# Patient Record
Sex: Female | Born: 1945 | Race: White | Hispanic: No | Smoking: Former smoker
Health system: Southern US, Community
[De-identification: ages and names within clinical notes are randomized; demographics above are authoritative.]

## PROBLEM LIST (undated history)

## (undated) DIAGNOSIS — I739 Peripheral vascular disease, unspecified: Secondary | ICD-10-CM

## (undated) DIAGNOSIS — E079 Disorder of thyroid, unspecified: Secondary | ICD-10-CM

## (undated) HISTORY — DX: Disorder of thyroid, unspecified: E07.9

## (undated) HISTORY — DX: Peripheral vascular disease, unspecified: I73.9

---

## 2014-11-14 DIAGNOSIS — Z9181 History of falling: Secondary | ICD-10-CM | POA: Diagnosis not present

## 2014-11-14 DIAGNOSIS — Z79899 Other long term (current) drug therapy: Secondary | ICD-10-CM | POA: Diagnosis not present

## 2014-11-14 DIAGNOSIS — E780 Pure hypercholesterolemia: Secondary | ICD-10-CM | POA: Diagnosis not present

## 2014-11-14 DIAGNOSIS — Z0000 Encounter for general adult medical examination without abnormal findings: Secondary | ICD-10-CM | POA: Diagnosis not present

## 2014-11-14 DIAGNOSIS — E039 Hypothyroidism, unspecified: Secondary | ICD-10-CM | POA: Diagnosis not present

## 2015-05-09 DIAGNOSIS — D72829 Elevated white blood cell count, unspecified: Secondary | ICD-10-CM | POA: Diagnosis not present

## 2015-05-09 DIAGNOSIS — B961 Klebsiella pneumoniae [K. pneumoniae] as the cause of diseases classified elsewhere: Secondary | ICD-10-CM | POA: Diagnosis not present

## 2015-05-09 DIAGNOSIS — E46 Unspecified protein-calorie malnutrition: Secondary | ICD-10-CM | POA: Diagnosis not present

## 2015-05-09 DIAGNOSIS — D539 Nutritional anemia, unspecified: Secondary | ICD-10-CM | POA: Diagnosis not present

## 2015-05-09 DIAGNOSIS — Z7401 Bed confinement status: Secondary | ICD-10-CM | POA: Diagnosis not present

## 2015-05-09 DIAGNOSIS — J810 Acute pulmonary edema: Secondary | ICD-10-CM | POA: Diagnosis not present

## 2015-05-09 DIAGNOSIS — N390 Urinary tract infection, site not specified: Secondary | ICD-10-CM | POA: Diagnosis not present

## 2015-05-09 DIAGNOSIS — J159 Unspecified bacterial pneumonia: Secondary | ICD-10-CM | POA: Diagnosis not present

## 2015-05-09 DIAGNOSIS — Z720 Tobacco use: Secondary | ICD-10-CM | POA: Diagnosis not present

## 2015-05-09 DIAGNOSIS — J9620 Acute and chronic respiratory failure, unspecified whether with hypoxia or hypercapnia: Secondary | ICD-10-CM | POA: Diagnosis not present

## 2015-05-09 DIAGNOSIS — F419 Anxiety disorder, unspecified: Secondary | ICD-10-CM | POA: Diagnosis not present

## 2015-05-09 DIAGNOSIS — J690 Pneumonitis due to inhalation of food and vomit: Secondary | ICD-10-CM | POA: Diagnosis not present

## 2015-05-09 DIAGNOSIS — E876 Hypokalemia: Secondary | ICD-10-CM | POA: Diagnosis not present

## 2015-05-09 DIAGNOSIS — D649 Anemia, unspecified: Secondary | ICD-10-CM | POA: Diagnosis not present

## 2015-05-09 DIAGNOSIS — J90 Pleural effusion, not elsewhere classified: Secondary | ICD-10-CM | POA: Diagnosis not present

## 2015-05-09 DIAGNOSIS — D644 Congenital dyserythropoietic anemia: Secondary | ICD-10-CM | POA: Diagnosis not present

## 2015-05-09 DIAGNOSIS — Z452 Encounter for adjustment and management of vascular access device: Secondary | ICD-10-CM | POA: Diagnosis not present

## 2015-05-09 DIAGNOSIS — J9811 Atelectasis: Secondary | ICD-10-CM | POA: Diagnosis not present

## 2015-05-09 DIAGNOSIS — M549 Dorsalgia, unspecified: Secondary | ICD-10-CM | POA: Diagnosis not present

## 2015-05-09 DIAGNOSIS — I878 Other specified disorders of veins: Secondary | ICD-10-CM | POA: Diagnosis not present

## 2015-05-09 DIAGNOSIS — J189 Pneumonia, unspecified organism: Secondary | ICD-10-CM | POA: Diagnosis not present

## 2015-05-09 DIAGNOSIS — R5381 Other malaise: Secondary | ICD-10-CM | POA: Diagnosis not present

## 2015-05-09 DIAGNOSIS — J9601 Acute respiratory failure with hypoxia: Secondary | ICD-10-CM | POA: Diagnosis not present

## 2015-05-09 DIAGNOSIS — E039 Hypothyroidism, unspecified: Secondary | ICD-10-CM | POA: Diagnosis not present

## 2015-05-09 DIAGNOSIS — L89151 Pressure ulcer of sacral region, stage 1: Secondary | ICD-10-CM | POA: Diagnosis not present

## 2015-05-09 DIAGNOSIS — I10 Essential (primary) hypertension: Secondary | ICD-10-CM | POA: Diagnosis not present

## 2015-05-09 DIAGNOSIS — E785 Hyperlipidemia, unspecified: Secondary | ICD-10-CM | POA: Diagnosis not present

## 2015-05-09 DIAGNOSIS — R0902 Hypoxemia: Secondary | ICD-10-CM | POA: Diagnosis not present

## 2015-05-09 DIAGNOSIS — B9620 Unspecified Escherichia coli [E. coli] as the cause of diseases classified elsewhere: Secondary | ICD-10-CM | POA: Diagnosis not present

## 2015-05-09 DIAGNOSIS — R2689 Other abnormalities of gait and mobility: Secondary | ICD-10-CM | POA: Diagnosis not present

## 2015-05-09 DIAGNOSIS — J40 Bronchitis, not specified as acute or chronic: Secondary | ICD-10-CM | POA: Diagnosis not present

## 2015-05-09 DIAGNOSIS — Z9981 Dependence on supplemental oxygen: Secondary | ICD-10-CM | POA: Diagnosis not present

## 2015-05-09 DIAGNOSIS — F1020 Alcohol dependence, uncomplicated: Secondary | ICD-10-CM | POA: Diagnosis not present

## 2015-05-09 DIAGNOSIS — Z602 Problems related to living alone: Secondary | ICD-10-CM | POA: Diagnosis not present

## 2015-05-09 DIAGNOSIS — E7800 Pure hypercholesterolemia, unspecified: Secondary | ICD-10-CM | POA: Diagnosis not present

## 2015-05-09 DIAGNOSIS — R1310 Dysphagia, unspecified: Secondary | ICD-10-CM | POA: Diagnosis not present

## 2015-05-09 DIAGNOSIS — R4182 Altered mental status, unspecified: Secondary | ICD-10-CM | POA: Diagnosis not present

## 2015-05-09 DIAGNOSIS — Z681 Body mass index (BMI) 19 or less, adult: Secondary | ICD-10-CM | POA: Diagnosis not present

## 2015-05-09 DIAGNOSIS — J9690 Respiratory failure, unspecified, unspecified whether with hypoxia or hypercapnia: Secondary | ICD-10-CM | POA: Diagnosis not present

## 2015-05-09 DIAGNOSIS — E8770 Fluid overload, unspecified: Secondary | ICD-10-CM | POA: Diagnosis not present

## 2015-05-09 DIAGNOSIS — R17 Unspecified jaundice: Secondary | ICD-10-CM | POA: Diagnosis not present

## 2015-05-09 DIAGNOSIS — R918 Other nonspecific abnormal finding of lung field: Secondary | ICD-10-CM | POA: Diagnosis not present

## 2015-05-09 DIAGNOSIS — N3000 Acute cystitis without hematuria: Secondary | ICD-10-CM | POA: Diagnosis not present

## 2015-05-09 DIAGNOSIS — R29898 Other symptoms and signs involving the musculoskeletal system: Secondary | ICD-10-CM | POA: Diagnosis not present

## 2015-05-09 DIAGNOSIS — M6281 Muscle weakness (generalized): Secondary | ICD-10-CM | POA: Diagnosis not present

## 2015-05-09 DIAGNOSIS — R197 Diarrhea, unspecified: Secondary | ICD-10-CM | POA: Diagnosis not present

## 2015-05-09 DIAGNOSIS — I959 Hypotension, unspecified: Secondary | ICD-10-CM | POA: Diagnosis not present

## 2015-05-09 DIAGNOSIS — R531 Weakness: Secondary | ICD-10-CM | POA: Diagnosis not present

## 2015-05-09 DIAGNOSIS — Z9989 Dependence on other enabling machines and devices: Secondary | ICD-10-CM | POA: Diagnosis not present

## 2015-05-09 DIAGNOSIS — F1010 Alcohol abuse, uncomplicated: Secondary | ICD-10-CM | POA: Diagnosis not present

## 2015-05-27 DIAGNOSIS — N390 Urinary tract infection, site not specified: Secondary | ICD-10-CM | POA: Diagnosis not present

## 2015-05-27 DIAGNOSIS — E785 Hyperlipidemia, unspecified: Secondary | ICD-10-CM | POA: Diagnosis not present

## 2015-05-27 DIAGNOSIS — R2689 Other abnormalities of gait and mobility: Secondary | ICD-10-CM | POA: Diagnosis not present

## 2015-05-27 DIAGNOSIS — R29898 Other symptoms and signs involving the musculoskeletal system: Secondary | ICD-10-CM | POA: Diagnosis not present

## 2015-05-27 DIAGNOSIS — D72829 Elevated white blood cell count, unspecified: Secondary | ICD-10-CM | POA: Diagnosis not present

## 2015-05-27 DIAGNOSIS — J690 Pneumonitis due to inhalation of food and vomit: Secondary | ICD-10-CM | POA: Diagnosis not present

## 2015-05-27 DIAGNOSIS — F419 Anxiety disorder, unspecified: Secondary | ICD-10-CM | POA: Diagnosis not present

## 2015-05-27 DIAGNOSIS — R531 Weakness: Secondary | ICD-10-CM | POA: Diagnosis not present

## 2015-05-27 DIAGNOSIS — J9611 Chronic respiratory failure with hypoxia: Secondary | ICD-10-CM | POA: Diagnosis not present

## 2015-05-27 DIAGNOSIS — D649 Anemia, unspecified: Secondary | ICD-10-CM | POA: Diagnosis not present

## 2015-05-27 DIAGNOSIS — L89151 Pressure ulcer of sacral region, stage 1: Secondary | ICD-10-CM | POA: Diagnosis not present

## 2015-05-27 DIAGNOSIS — Z7401 Bed confinement status: Secondary | ICD-10-CM | POA: Diagnosis not present

## 2015-05-27 DIAGNOSIS — F1010 Alcohol abuse, uncomplicated: Secondary | ICD-10-CM | POA: Diagnosis not present

## 2015-05-27 DIAGNOSIS — R627 Adult failure to thrive: Secondary | ICD-10-CM | POA: Diagnosis not present

## 2015-05-27 DIAGNOSIS — E039 Hypothyroidism, unspecified: Secondary | ICD-10-CM | POA: Diagnosis not present

## 2015-05-27 DIAGNOSIS — R262 Difficulty in walking, not elsewhere classified: Secondary | ICD-10-CM | POA: Diagnosis not present

## 2015-05-27 DIAGNOSIS — J9601 Acute respiratory failure with hypoxia: Secondary | ICD-10-CM | POA: Diagnosis not present

## 2015-05-27 DIAGNOSIS — I959 Hypotension, unspecified: Secondary | ICD-10-CM | POA: Diagnosis not present

## 2015-05-27 DIAGNOSIS — E876 Hypokalemia: Secondary | ICD-10-CM | POA: Diagnosis not present

## 2015-05-27 DIAGNOSIS — I878 Other specified disorders of veins: Secondary | ICD-10-CM | POA: Diagnosis not present

## 2015-05-27 DIAGNOSIS — R1310 Dysphagia, unspecified: Secondary | ICD-10-CM | POA: Diagnosis not present

## 2015-05-27 DIAGNOSIS — M6281 Muscle weakness (generalized): Secondary | ICD-10-CM | POA: Diagnosis not present

## 2015-05-27 DIAGNOSIS — J159 Unspecified bacterial pneumonia: Secondary | ICD-10-CM | POA: Diagnosis not present

## 2015-05-27 DIAGNOSIS — I10 Essential (primary) hypertension: Secondary | ICD-10-CM | POA: Diagnosis not present

## 2015-05-27 DIAGNOSIS — D539 Nutritional anemia, unspecified: Secondary | ICD-10-CM | POA: Diagnosis not present

## 2015-05-27 DIAGNOSIS — J449 Chronic obstructive pulmonary disease, unspecified: Secondary | ICD-10-CM | POA: Diagnosis not present

## 2015-06-04 DIAGNOSIS — J9611 Chronic respiratory failure with hypoxia: Secondary | ICD-10-CM | POA: Diagnosis not present

## 2015-06-04 DIAGNOSIS — R627 Adult failure to thrive: Secondary | ICD-10-CM | POA: Diagnosis not present

## 2015-06-04 DIAGNOSIS — R262 Difficulty in walking, not elsewhere classified: Secondary | ICD-10-CM | POA: Diagnosis not present

## 2015-06-04 DIAGNOSIS — D649 Anemia, unspecified: Secondary | ICD-10-CM | POA: Diagnosis not present

## 2015-06-15 DIAGNOSIS — J9601 Acute respiratory failure with hypoxia: Secondary | ICD-10-CM | POA: Diagnosis not present

## 2015-06-15 DIAGNOSIS — Z87891 Personal history of nicotine dependence: Secondary | ICD-10-CM | POA: Diagnosis not present

## 2015-06-15 DIAGNOSIS — I10 Essential (primary) hypertension: Secondary | ICD-10-CM | POA: Diagnosis not present

## 2015-06-15 DIAGNOSIS — F1010 Alcohol abuse, uncomplicated: Secondary | ICD-10-CM | POA: Diagnosis not present

## 2015-06-15 DIAGNOSIS — Z9181 History of falling: Secondary | ICD-10-CM | POA: Diagnosis not present

## 2015-06-15 DIAGNOSIS — Z7982 Long term (current) use of aspirin: Secondary | ICD-10-CM | POA: Diagnosis not present

## 2015-06-15 DIAGNOSIS — D649 Anemia, unspecified: Secondary | ICD-10-CM | POA: Diagnosis not present

## 2015-06-16 DIAGNOSIS — I10 Essential (primary) hypertension: Secondary | ICD-10-CM | POA: Diagnosis not present

## 2015-06-16 DIAGNOSIS — J9601 Acute respiratory failure with hypoxia: Secondary | ICD-10-CM | POA: Diagnosis not present

## 2015-06-16 DIAGNOSIS — Z87891 Personal history of nicotine dependence: Secondary | ICD-10-CM | POA: Diagnosis not present

## 2015-06-16 DIAGNOSIS — Z9181 History of falling: Secondary | ICD-10-CM | POA: Diagnosis not present

## 2015-06-16 DIAGNOSIS — F1010 Alcohol abuse, uncomplicated: Secondary | ICD-10-CM | POA: Diagnosis not present

## 2015-06-16 DIAGNOSIS — Z7982 Long term (current) use of aspirin: Secondary | ICD-10-CM | POA: Diagnosis not present

## 2015-06-16 DIAGNOSIS — D649 Anemia, unspecified: Secondary | ICD-10-CM | POA: Diagnosis not present

## 2015-06-17 DIAGNOSIS — I10 Essential (primary) hypertension: Secondary | ICD-10-CM | POA: Diagnosis not present

## 2015-06-17 DIAGNOSIS — D649 Anemia, unspecified: Secondary | ICD-10-CM | POA: Diagnosis not present

## 2015-06-17 DIAGNOSIS — Z7982 Long term (current) use of aspirin: Secondary | ICD-10-CM | POA: Diagnosis not present

## 2015-06-17 DIAGNOSIS — Z9181 History of falling: Secondary | ICD-10-CM | POA: Diagnosis not present

## 2015-06-17 DIAGNOSIS — J9601 Acute respiratory failure with hypoxia: Secondary | ICD-10-CM | POA: Diagnosis not present

## 2015-06-17 DIAGNOSIS — Z87891 Personal history of nicotine dependence: Secondary | ICD-10-CM | POA: Diagnosis not present

## 2015-06-17 DIAGNOSIS — F1010 Alcohol abuse, uncomplicated: Secondary | ICD-10-CM | POA: Diagnosis not present

## 2015-11-17 DIAGNOSIS — E039 Hypothyroidism, unspecified: Secondary | ICD-10-CM | POA: Diagnosis not present

## 2015-11-17 DIAGNOSIS — E785 Hyperlipidemia, unspecified: Secondary | ICD-10-CM | POA: Diagnosis not present

## 2015-11-17 DIAGNOSIS — I10 Essential (primary) hypertension: Secondary | ICD-10-CM | POA: Diagnosis not present

## 2015-11-17 DIAGNOSIS — F17200 Nicotine dependence, unspecified, uncomplicated: Secondary | ICD-10-CM | POA: Diagnosis not present

## 2015-11-17 DIAGNOSIS — R739 Hyperglycemia, unspecified: Secondary | ICD-10-CM | POA: Diagnosis not present

## 2015-11-17 DIAGNOSIS — Z79899 Other long term (current) drug therapy: Secondary | ICD-10-CM | POA: Diagnosis not present

## 2015-11-17 DIAGNOSIS — D509 Iron deficiency anemia, unspecified: Secondary | ICD-10-CM | POA: Diagnosis not present

## 2015-11-17 DIAGNOSIS — Z716 Tobacco abuse counseling: Secondary | ICD-10-CM | POA: Diagnosis not present

## 2016-05-14 DIAGNOSIS — Z79899 Other long term (current) drug therapy: Secondary | ICD-10-CM | POA: Diagnosis not present

## 2016-05-14 DIAGNOSIS — E039 Hypothyroidism, unspecified: Secondary | ICD-10-CM | POA: Diagnosis not present

## 2016-05-14 DIAGNOSIS — E785 Hyperlipidemia, unspecified: Secondary | ICD-10-CM | POA: Diagnosis not present

## 2016-05-14 DIAGNOSIS — Z720 Tobacco use: Secondary | ICD-10-CM | POA: Diagnosis not present

## 2016-05-14 DIAGNOSIS — D509 Iron deficiency anemia, unspecified: Secondary | ICD-10-CM | POA: Diagnosis not present

## 2016-05-14 DIAGNOSIS — I10 Essential (primary) hypertension: Secondary | ICD-10-CM | POA: Diagnosis not present

## 2017-05-25 DIAGNOSIS — I10 Essential (primary) hypertension: Secondary | ICD-10-CM | POA: Diagnosis not present

## 2017-05-25 DIAGNOSIS — Z720 Tobacco use: Secondary | ICD-10-CM | POA: Diagnosis not present

## 2017-05-25 DIAGNOSIS — E785 Hyperlipidemia, unspecified: Secondary | ICD-10-CM | POA: Diagnosis not present

## 2017-05-25 DIAGNOSIS — D509 Iron deficiency anemia, unspecified: Secondary | ICD-10-CM | POA: Diagnosis not present

## 2017-05-25 DIAGNOSIS — D519 Vitamin B12 deficiency anemia, unspecified: Secondary | ICD-10-CM | POA: Diagnosis not present

## 2017-05-25 DIAGNOSIS — Z0000 Encounter for general adult medical examination without abnormal findings: Secondary | ICD-10-CM | POA: Diagnosis not present

## 2017-05-25 DIAGNOSIS — Z79899 Other long term (current) drug therapy: Secondary | ICD-10-CM | POA: Diagnosis not present

## 2017-05-25 DIAGNOSIS — E039 Hypothyroidism, unspecified: Secondary | ICD-10-CM | POA: Diagnosis not present

## 2018-09-14 DIAGNOSIS — Z0000 Encounter for general adult medical examination without abnormal findings: Secondary | ICD-10-CM | POA: Diagnosis not present

## 2018-09-14 DIAGNOSIS — E785 Hyperlipidemia, unspecified: Secondary | ICD-10-CM | POA: Diagnosis not present

## 2018-09-14 DIAGNOSIS — E039 Hypothyroidism, unspecified: Secondary | ICD-10-CM | POA: Diagnosis not present

## 2018-09-14 DIAGNOSIS — D510 Vitamin B12 deficiency anemia due to intrinsic factor deficiency: Secondary | ICD-10-CM | POA: Diagnosis not present

## 2018-09-14 DIAGNOSIS — D509 Iron deficiency anemia, unspecified: Secondary | ICD-10-CM | POA: Diagnosis not present

## 2018-09-14 DIAGNOSIS — Z79899 Other long term (current) drug therapy: Secondary | ICD-10-CM | POA: Diagnosis not present

## 2018-09-14 DIAGNOSIS — I10 Essential (primary) hypertension: Secondary | ICD-10-CM | POA: Diagnosis not present

## 2019-10-02 DIAGNOSIS — E039 Hypothyroidism, unspecified: Secondary | ICD-10-CM | POA: Diagnosis not present

## 2019-10-02 DIAGNOSIS — Z0000 Encounter for general adult medical examination without abnormal findings: Secondary | ICD-10-CM | POA: Diagnosis not present

## 2019-10-02 DIAGNOSIS — Z79899 Other long term (current) drug therapy: Secondary | ICD-10-CM | POA: Diagnosis not present

## 2019-10-02 DIAGNOSIS — I10 Essential (primary) hypertension: Secondary | ICD-10-CM | POA: Diagnosis not present

## 2019-10-02 DIAGNOSIS — R739 Hyperglycemia, unspecified: Secondary | ICD-10-CM | POA: Diagnosis not present

## 2019-10-02 DIAGNOSIS — E785 Hyperlipidemia, unspecified: Secondary | ICD-10-CM | POA: Diagnosis not present

## 2019-10-02 DIAGNOSIS — K760 Fatty (change of) liver, not elsewhere classified: Secondary | ICD-10-CM | POA: Diagnosis not present

## 2020-01-30 DIAGNOSIS — E440 Moderate protein-calorie malnutrition: Secondary | ICD-10-CM | POA: Diagnosis not present

## 2020-01-30 DIAGNOSIS — S8991XA Unspecified injury of right lower leg, initial encounter: Secondary | ICD-10-CM | POA: Diagnosis not present

## 2020-01-30 DIAGNOSIS — R600 Localized edema: Secondary | ICD-10-CM | POA: Diagnosis not present

## 2020-04-12 ENCOUNTER — Inpatient Hospital Stay (HOSPITAL_COMMUNITY)
Admission: EM | Admit: 2020-04-12 | Discharge: 2020-05-03 | DRG: 853 | Disposition: A | Discharge status: Skilled Nursing Facility | Payer: Medicare Other | Attending: Internal Medicine | Admitting: Internal Medicine

## 2020-04-12 ENCOUNTER — Emergency Department (HOSPITAL_COMMUNITY): Payer: Medicare Other

## 2020-04-12 DIAGNOSIS — D6959 Other secondary thrombocytopenia: Secondary | ICD-10-CM | POA: Diagnosis present

## 2020-04-12 DIAGNOSIS — D696 Thrombocytopenia, unspecified: Secondary | ICD-10-CM | POA: Diagnosis not present

## 2020-04-12 DIAGNOSIS — E1152 Type 2 diabetes mellitus with diabetic peripheral angiopathy with gangrene: Secondary | ICD-10-CM | POA: Diagnosis not present

## 2020-04-12 DIAGNOSIS — E871 Hypo-osmolality and hyponatremia: Secondary | ICD-10-CM | POA: Diagnosis not present

## 2020-04-12 DIAGNOSIS — G9341 Metabolic encephalopathy: Secondary | ICD-10-CM | POA: Diagnosis not present

## 2020-04-12 DIAGNOSIS — E11649 Type 2 diabetes mellitus with hypoglycemia without coma: Secondary | ICD-10-CM | POA: Diagnosis present

## 2020-04-12 DIAGNOSIS — R1312 Dysphagia, oropharyngeal phase: Secondary | ICD-10-CM | POA: Diagnosis present

## 2020-04-12 DIAGNOSIS — E860 Dehydration: Secondary | ICD-10-CM | POA: Diagnosis not present

## 2020-04-12 DIAGNOSIS — M2550 Pain in unspecified joint: Secondary | ICD-10-CM | POA: Diagnosis not present

## 2020-04-12 DIAGNOSIS — I82890 Acute embolism and thrombosis of other specified veins: Secondary | ICD-10-CM | POA: Diagnosis not present

## 2020-04-12 DIAGNOSIS — N179 Acute kidney failure, unspecified: Secondary | ICD-10-CM | POA: Diagnosis present

## 2020-04-12 DIAGNOSIS — R7989 Other specified abnormal findings of blood chemistry: Secondary | ICD-10-CM

## 2020-04-12 DIAGNOSIS — I130 Hypertensive heart and chronic kidney disease with heart failure and stage 1 through stage 4 chronic kidney disease, or unspecified chronic kidney disease: Secondary | ICD-10-CM | POA: Diagnosis present

## 2020-04-12 DIAGNOSIS — R531 Weakness: Secondary | ICD-10-CM | POA: Diagnosis not present

## 2020-04-12 DIAGNOSIS — T68XXXA Hypothermia, initial encounter: Secondary | ICD-10-CM

## 2020-04-12 DIAGNOSIS — E039 Hypothyroidism, unspecified: Secondary | ICD-10-CM | POA: Diagnosis not present

## 2020-04-12 DIAGNOSIS — R748 Abnormal levels of other serum enzymes: Secondary | ICD-10-CM

## 2020-04-12 DIAGNOSIS — R471 Dysarthria and anarthria: Secondary | ICD-10-CM | POA: Diagnosis not present

## 2020-04-12 DIAGNOSIS — K7200 Acute and subacute hepatic failure without coma: Secondary | ICD-10-CM | POA: Diagnosis not present

## 2020-04-12 DIAGNOSIS — D509 Iron deficiency anemia, unspecified: Secondary | ICD-10-CM | POA: Diagnosis not present

## 2020-04-12 DIAGNOSIS — R6521 Severe sepsis with septic shock: Secondary | ICD-10-CM | POA: Diagnosis present

## 2020-04-12 DIAGNOSIS — E7220 Disorder of urea cycle metabolism, unspecified: Secondary | ICD-10-CM | POA: Diagnosis not present

## 2020-04-12 DIAGNOSIS — N170 Acute kidney failure with tubular necrosis: Secondary | ICD-10-CM | POA: Diagnosis present

## 2020-04-12 DIAGNOSIS — J9811 Atelectasis: Secondary | ICD-10-CM | POA: Diagnosis not present

## 2020-04-12 DIAGNOSIS — E876 Hypokalemia: Secondary | ICD-10-CM | POA: Diagnosis present

## 2020-04-12 DIAGNOSIS — L538 Other specified erythematous conditions: Secondary | ICD-10-CM | POA: Diagnosis not present

## 2020-04-12 DIAGNOSIS — R0600 Dyspnea, unspecified: Secondary | ICD-10-CM | POA: Diagnosis not present

## 2020-04-12 DIAGNOSIS — J9601 Acute respiratory failure with hypoxia: Secondary | ICD-10-CM | POA: Diagnosis not present

## 2020-04-12 DIAGNOSIS — R4182 Altered mental status, unspecified: Secondary | ICD-10-CM

## 2020-04-12 DIAGNOSIS — E872 Acidosis, unspecified: Secondary | ICD-10-CM

## 2020-04-12 DIAGNOSIS — R2681 Unsteadiness on feet: Secondary | ICD-10-CM | POA: Diagnosis not present

## 2020-04-12 DIAGNOSIS — Z1623 Resistance to quinolones and fluoroquinolones: Secondary | ICD-10-CM | POA: Diagnosis present

## 2020-04-12 DIAGNOSIS — I5043 Acute on chronic combined systolic (congestive) and diastolic (congestive) heart failure: Secondary | ICD-10-CM | POA: Diagnosis not present

## 2020-04-12 DIAGNOSIS — Z20822 Contact with and (suspected) exposure to covid-19: Secondary | ICD-10-CM | POA: Diagnosis present

## 2020-04-12 DIAGNOSIS — Z87891 Personal history of nicotine dependence: Secondary | ICD-10-CM

## 2020-04-12 DIAGNOSIS — I70221 Atherosclerosis of native arteries of extremities with rest pain, right leg: Secondary | ICD-10-CM | POA: Diagnosis present

## 2020-04-12 DIAGNOSIS — I82551 Chronic embolism and thrombosis of right peroneal vein: Secondary | ICD-10-CM | POA: Diagnosis present

## 2020-04-12 DIAGNOSIS — E7849 Other hyperlipidemia: Secondary | ICD-10-CM | POA: Diagnosis not present

## 2020-04-12 DIAGNOSIS — J449 Chronic obstructive pulmonary disease, unspecified: Secondary | ICD-10-CM | POA: Diagnosis not present

## 2020-04-12 DIAGNOSIS — D649 Anemia, unspecified: Secondary | ICD-10-CM

## 2020-04-12 DIAGNOSIS — M726 Necrotizing fasciitis: Secondary | ICD-10-CM | POA: Diagnosis present

## 2020-04-12 DIAGNOSIS — R571 Hypovolemic shock: Secondary | ICD-10-CM | POA: Diagnosis present

## 2020-04-12 DIAGNOSIS — I70231 Atherosclerosis of native arteries of right leg with ulceration of thigh: Secondary | ICD-10-CM | POA: Diagnosis not present

## 2020-04-12 DIAGNOSIS — Z8739 Personal history of other diseases of the musculoskeletal system and connective tissue: Secondary | ICD-10-CM | POA: Diagnosis not present

## 2020-04-12 DIAGNOSIS — M6281 Muscle weakness (generalized): Secondary | ICD-10-CM | POA: Diagnosis not present

## 2020-04-12 DIAGNOSIS — R188 Other ascites: Secondary | ICD-10-CM

## 2020-04-12 DIAGNOSIS — R918 Other nonspecific abnormal finding of lung field: Secondary | ICD-10-CM | POA: Diagnosis not present

## 2020-04-12 DIAGNOSIS — R7881 Bacteremia: Secondary | ICD-10-CM | POA: Diagnosis not present

## 2020-04-12 DIAGNOSIS — Z7401 Bed confinement status: Secondary | ICD-10-CM | POA: Diagnosis not present

## 2020-04-12 DIAGNOSIS — R609 Edema, unspecified: Secondary | ICD-10-CM | POA: Diagnosis not present

## 2020-04-12 DIAGNOSIS — E43 Unspecified severe protein-calorie malnutrition: Secondary | ICD-10-CM | POA: Diagnosis not present

## 2020-04-12 DIAGNOSIS — N289 Disorder of kidney and ureter, unspecified: Secondary | ICD-10-CM

## 2020-04-12 DIAGNOSIS — E1122 Type 2 diabetes mellitus with diabetic chronic kidney disease: Secondary | ICD-10-CM | POA: Diagnosis present

## 2020-04-12 DIAGNOSIS — N184 Chronic kidney disease, stage 4 (severe): Secondary | ICD-10-CM | POA: Diagnosis present

## 2020-04-12 DIAGNOSIS — R64 Cachexia: Secondary | ICD-10-CM | POA: Diagnosis present

## 2020-04-12 DIAGNOSIS — L89619 Pressure ulcer of right heel, unspecified stage: Secondary | ICD-10-CM | POA: Diagnosis present

## 2020-04-12 DIAGNOSIS — E7800 Pure hypercholesterolemia, unspecified: Secondary | ICD-10-CM | POA: Diagnosis present

## 2020-04-12 DIAGNOSIS — R1314 Dysphagia, pharyngoesophageal phase: Secondary | ICD-10-CM | POA: Diagnosis not present

## 2020-04-12 DIAGNOSIS — I70261 Atherosclerosis of native arteries of extremities with gangrene, right leg: Secondary | ICD-10-CM | POA: Diagnosis not present

## 2020-04-12 DIAGNOSIS — I998 Other disorder of circulatory system: Secondary | ICD-10-CM | POA: Diagnosis not present

## 2020-04-12 DIAGNOSIS — I4510 Unspecified right bundle-branch block: Secondary | ICD-10-CM | POA: Diagnosis not present

## 2020-04-12 DIAGNOSIS — L03116 Cellulitis of left lower limb: Secondary | ICD-10-CM | POA: Diagnosis not present

## 2020-04-12 DIAGNOSIS — L89153 Pressure ulcer of sacral region, stage 3: Secondary | ICD-10-CM | POA: Diagnosis present

## 2020-04-12 DIAGNOSIS — G928 Other toxic encephalopathy: Secondary | ICD-10-CM | POA: Diagnosis not present

## 2020-04-12 DIAGNOSIS — E162 Hypoglycemia, unspecified: Secondary | ICD-10-CM

## 2020-04-12 DIAGNOSIS — Z79899 Other long term (current) drug therapy: Secondary | ICD-10-CM

## 2020-04-12 DIAGNOSIS — R627 Adult failure to thrive: Secondary | ICD-10-CM | POA: Diagnosis present

## 2020-04-12 DIAGNOSIS — I7090 Unspecified atherosclerosis: Secondary | ICD-10-CM | POA: Diagnosis not present

## 2020-04-12 DIAGNOSIS — A419 Sepsis, unspecified organism: Secondary | ICD-10-CM | POA: Diagnosis not present

## 2020-04-12 DIAGNOSIS — R5381 Other malaise: Secondary | ICD-10-CM | POA: Diagnosis not present

## 2020-04-12 DIAGNOSIS — E8809 Other disorders of plasma-protein metabolism, not elsewhere classified: Secondary | ICD-10-CM | POA: Diagnosis not present

## 2020-04-12 DIAGNOSIS — I5021 Acute systolic (congestive) heart failure: Secondary | ICD-10-CM | POA: Diagnosis not present

## 2020-04-12 DIAGNOSIS — R579 Shock, unspecified: Secondary | ICD-10-CM | POA: Diagnosis present

## 2020-04-12 DIAGNOSIS — Z66 Do not resuscitate: Secondary | ICD-10-CM | POA: Diagnosis not present

## 2020-04-12 DIAGNOSIS — R159 Full incontinence of feces: Secondary | ICD-10-CM | POA: Diagnosis present

## 2020-04-12 DIAGNOSIS — J811 Chronic pulmonary edema: Secondary | ICD-10-CM | POA: Diagnosis not present

## 2020-04-12 DIAGNOSIS — L89629 Pressure ulcer of left heel, unspecified stage: Secondary | ICD-10-CM | POA: Diagnosis present

## 2020-04-12 DIAGNOSIS — R0602 Shortness of breath: Secondary | ICD-10-CM

## 2020-04-12 DIAGNOSIS — L97119 Non-pressure chronic ulcer of right thigh with unspecified severity: Secondary | ICD-10-CM | POA: Diagnosis present

## 2020-04-12 DIAGNOSIS — Z515 Encounter for palliative care: Secondary | ICD-10-CM | POA: Diagnosis not present

## 2020-04-12 DIAGNOSIS — I10 Essential (primary) hypertension: Secondary | ICD-10-CM | POA: Diagnosis not present

## 2020-04-12 DIAGNOSIS — A4150 Gram-negative sepsis, unspecified: Principal | ICD-10-CM | POA: Diagnosis present

## 2020-04-12 DIAGNOSIS — E11622 Type 2 diabetes mellitus with other skin ulcer: Secondary | ICD-10-CM | POA: Diagnosis present

## 2020-04-12 DIAGNOSIS — D6489 Other specified anemias: Secondary | ICD-10-CM | POA: Diagnosis present

## 2020-04-12 DIAGNOSIS — Z4781 Encounter for orthopedic aftercare following surgical amputation: Secondary | ICD-10-CM | POA: Diagnosis not present

## 2020-04-12 DIAGNOSIS — L8990 Pressure ulcer of unspecified site, unspecified stage: Secondary | ICD-10-CM | POA: Insufficient documentation

## 2020-04-12 DIAGNOSIS — Z681 Body mass index (BMI) 19 or less, adult: Secondary | ICD-10-CM

## 2020-04-12 DIAGNOSIS — I4891 Unspecified atrial fibrillation: Secondary | ICD-10-CM | POA: Diagnosis not present

## 2020-04-12 DIAGNOSIS — L97129 Non-pressure chronic ulcer of left thigh with unspecified severity: Secondary | ICD-10-CM | POA: Diagnosis not present

## 2020-04-12 DIAGNOSIS — K8020 Calculus of gallbladder without cholecystitis without obstruction: Secondary | ICD-10-CM | POA: Diagnosis not present

## 2020-04-12 DIAGNOSIS — G9340 Encephalopathy, unspecified: Secondary | ICD-10-CM | POA: Diagnosis not present

## 2020-04-12 DIAGNOSIS — Z89611 Acquired absence of right leg above knee: Secondary | ICD-10-CM | POA: Diagnosis not present

## 2020-04-12 DIAGNOSIS — I81 Portal vein thrombosis: Secondary | ICD-10-CM | POA: Diagnosis not present

## 2020-04-12 DIAGNOSIS — R0902 Hypoxemia: Secondary | ICD-10-CM | POA: Diagnosis not present

## 2020-04-12 DIAGNOSIS — E161 Other hypoglycemia: Secondary | ICD-10-CM | POA: Diagnosis not present

## 2020-04-12 DIAGNOSIS — R001 Bradycardia, unspecified: Secondary | ICD-10-CM | POA: Diagnosis not present

## 2020-04-12 DIAGNOSIS — Z743 Need for continuous supervision: Secondary | ICD-10-CM | POA: Diagnosis not present

## 2020-04-12 DIAGNOSIS — Z7189 Other specified counseling: Secondary | ICD-10-CM | POA: Diagnosis not present

## 2020-04-12 DIAGNOSIS — R600 Localized edema: Secondary | ICD-10-CM | POA: Diagnosis not present

## 2020-04-12 DIAGNOSIS — K529 Noninfective gastroenteritis and colitis, unspecified: Secondary | ICD-10-CM | POA: Diagnosis present

## 2020-04-12 DIAGNOSIS — J90 Pleural effusion, not elsewhere classified: Secondary | ICD-10-CM | POA: Diagnosis not present

## 2020-04-12 DIAGNOSIS — I96 Gangrene, not elsewhere classified: Secondary | ICD-10-CM | POA: Diagnosis present

## 2020-04-12 DIAGNOSIS — R4781 Slurred speech: Secondary | ICD-10-CM | POA: Diagnosis not present

## 2020-04-12 DIAGNOSIS — I5033 Acute on chronic diastolic (congestive) heart failure: Secondary | ICD-10-CM | POA: Diagnosis not present

## 2020-04-12 LAB — CBG MONITORING, ED
Glucose-Capillary: 19 mg/dL — CL (ref 70–99)
Glucose-Capillary: 312 mg/dL — ABNORMAL HIGH (ref 70–99)

## 2020-04-12 MED ORDER — DEXTROSE 50 % IV SOLN
INTRAVENOUS | Status: AC
  Filled 2020-04-12: qty 50

## 2020-04-12 NOTE — ED Notes (Signed)
Bearhugger warmer placed on patient after rectal temp reading of 89.9

## 2020-04-12 NOTE — ED Triage Notes (Signed)
Pt arrives via EMS from home. Daughter called for ambulance after driving to pt's home, seeing pt sitting in chair through the window, and saw that pt was unable to stand up and open the door. EMS reports bilateral weeping edema in lower extremities & pt mentioning bilateral leg and foot pain.   EMS CBG initially 33; pt received Dextrose 10 for intervention. After this, CBG reading was 37. EMS gave 1 shot of glucagon for this reading, while pulling into [place]. EMS states that pt was A&O x2, to self and date. Also noted intermittent episodes of slurred speech between baseline clear speech.

## 2020-04-12 NOTE — ED Provider Notes (Signed)
[facility] EMERGENCY DEPARTMENT Provider Note   CSN: [id number] Arrival date & time: 04/12/20  2316   History Chief complaint: Altered mental status  Michelle Avila is a 74 y.o. female.  The history is provided by the EMS personnel. The history is limited by the condition of the patient (Altered mental status).  Apparently, neighbors had not seen or heard from her in several days.  Daughter was notified and came up from [place] and found her sitting on the floor in her own stool.  She was very confused.  EMS arrived and noted glucose of 19 and gave her D10.  She is not known to be diabetic.  No past medical history on file.  There are no problems to display for this patient.   ** The histories are not reviewed yet. Please review them in the "History" navigator section and refresh this [place].   OB History   No obstetric history on file.     No family history on file.  Social History   Tobacco Use  . Smoking status: Not on file  Substance Use Topics  . Alcohol use: Not on file  . Drug use: Not on file    Home Medications Prior to Admission medications   Not on File    Allergies    Patient has no allergy information on record.  Review of Systems   Review of Systems  Unable to perform ROS: Mental status change    Physical Exam Updated Vital Signs BP 124/61   Pulse 69   Temp (!) 89.9 F (32.2 C) (Rectal)   Resp (!) 21   SpO2 99%   Physical Exam Vitals and nursing note reviewed.   74 year old female, resting comfortably and in no acute distress. Vital signs are significant for low body temperature and borderline elevated respiratory rate. Oxygen saturation is 99%, which is normal. Head is normocephalic and atraumatic. PERRLA, EOMI. Oropharynx is clear. Neck is nontender and supple without adenopathy or JVD. Back is nontender and there is no CVA tenderness. Lungs are clear without rales, wheezes, or rhonchi. Chest is  nontender. Heart has regular rate and rhythm without murmur. Abdomen is soft, flat, nontender without masses or hepatosplenomegaly and peristalsis is normoactive. Extremities have 2+ edema.  Feet are cool and mottled.  Stage I decubitus noted on both heels. Skin is warm and dry.  Stage I sacral decubitus noted is in addition to the decubiti noted on heels mentioned above. Neurologic: Awake and oriented to person and partly oriented to place but not time.  Very slow to answer questions, cranial nerves are intact.  Strength is 5/5 in all extremities.  ED Results / Procedures / Treatments   Labs (all labs ordered are listed, but only abnormal results are displayed) Labs Reviewed  CBG MONITORING, ED - Abnormal; Notable for the following components:      Result Value   Glucose-Capillary 19 (*)    All other components within normal limits  CBG MONITORING, ED - Abnormal; Notable for the following components:   Glucose-Capillary 312 (*)    All other components within normal limits    EKG Sinus bradycardia 57 bpm.  Normal axis.  Normal intervals.  Nonspecific T wave changes.  Artifact precludes additional interpretation.  No prior tracing available for comparison.  Radiology No results found.  Procedures Procedures  CRITICAL CARE Performed by: Delora Fuel Total critical care time: 150 minutes Critical care time was exclusive of separately billable procedures and treating  other patients. Critical care was necessary to treat or prevent imminent or life-threatening deterioration. Critical care was time spent personally by me on the following activities: development of treatment plan with patient and/or surrogate as well as nursing, discussions with consultants, evaluation of patient's response to treatment, examination of patient, obtaining history from patient or surrogate, ordering and performing treatments and interventions, ordering and review of laboratory studies, ordering and review of  radiographic studies, pulse oximetry and re-evaluation of patient's condition.  Medications Ordered in ED Medications  dextrose 50 % solution (  Given 04/12/20 2339)    ED Course  I have reviewed the triage vital signs and the nursing notes.  Pertinent labs & imaging results that were available during my care of the patient were reviewed by me and considered in my medical decision making (see chart for details).  MDM Rules/Calculators/A&P Hypoglycemia and patient not known to be diabetic, not on insulin or oral hypoglycemic agents.  This may be purely nutritional, but will need to observe glucose carefully.  Initial glucose was 19, improved to 312 after D50.  She is given a meal.  Screening labs ordered.  There are no old records in [facility] or in care everywhere.  12:47 AM Glucose has come back down to 115, will need to watch closely.  Labs show metabolic acidosis with borderline elevated anion gap, hypokalemia, renal insufficiency-uncertain if new or old, mild elevation of AST, mild to moderate anemia, mild thrombocytopenia.  She is noted to be markedly hypothermic and is started on external warming blanket.  1:06 AM Lactate has come back markedly elevated. She is given aggressive IV hydration. I do not believe this is from sepsis, but feel it is related to her severe hypoglycemia and probable dehydration.  1:45 AM Case is discussed with Dr. Oletta Darter of intensive care service who states that one of the intensivist will come to evaluate the patient for possible admission.  Lactic acid level has actually gone up with hydration.  This may be related to increased perfusion to areas that had generated significant lactic acid.  Will need to follow.  Final Clinical Impression(s) / ED Diagnoses Final diagnoses:  Altered mental status, unspecified altered mental status type  Hypoglycemia  Hypothermia, initial encounter  Increased ammonia level  Hypokalemia  Renal insufficiency  Metabolic  acidosis  Alkaline phosphatase elevation  Normochromic anemia  Thrombocytopenia (HCC)  Elevated lactic acid level    Rx / DC Orders ED Discharge Orders    None       Delora Fuel, MD 67/67/20 0300

## 2020-04-13 ENCOUNTER — Inpatient Hospital Stay (HOSPITAL_COMMUNITY): Payer: Medicare Other

## 2020-04-13 ENCOUNTER — Emergency Department (HOSPITAL_COMMUNITY): Payer: Medicare Other

## 2020-04-13 DIAGNOSIS — Z20822 Contact with and (suspected) exposure to covid-19: Secondary | ICD-10-CM | POA: Diagnosis present

## 2020-04-13 DIAGNOSIS — L97119 Non-pressure chronic ulcer of right thigh with unspecified severity: Secondary | ICD-10-CM | POA: Diagnosis present

## 2020-04-13 DIAGNOSIS — R7989 Other specified abnormal findings of blood chemistry: Secondary | ICD-10-CM | POA: Diagnosis not present

## 2020-04-13 DIAGNOSIS — A4150 Gram-negative sepsis, unspecified: Secondary | ICD-10-CM | POA: Diagnosis present

## 2020-04-13 DIAGNOSIS — R6521 Severe sepsis with septic shock: Secondary | ICD-10-CM | POA: Diagnosis not present

## 2020-04-13 DIAGNOSIS — Z7189 Other specified counseling: Secondary | ICD-10-CM | POA: Diagnosis not present

## 2020-04-13 DIAGNOSIS — M726 Necrotizing fasciitis: Secondary | ICD-10-CM | POA: Diagnosis present

## 2020-04-13 DIAGNOSIS — L03116 Cellulitis of left lower limb: Secondary | ICD-10-CM | POA: Diagnosis present

## 2020-04-13 DIAGNOSIS — R579 Shock, unspecified: Secondary | ICD-10-CM | POA: Diagnosis present

## 2020-04-13 DIAGNOSIS — J90 Pleural effusion, not elsewhere classified: Secondary | ICD-10-CM | POA: Diagnosis not present

## 2020-04-13 DIAGNOSIS — R4182 Altered mental status, unspecified: Secondary | ICD-10-CM | POA: Diagnosis not present

## 2020-04-13 DIAGNOSIS — E871 Hypo-osmolality and hyponatremia: Secondary | ICD-10-CM | POA: Diagnosis not present

## 2020-04-13 DIAGNOSIS — Z8739 Personal history of other diseases of the musculoskeletal system and connective tissue: Secondary | ICD-10-CM | POA: Diagnosis not present

## 2020-04-13 DIAGNOSIS — N170 Acute kidney failure with tubular necrosis: Secondary | ICD-10-CM | POA: Diagnosis present

## 2020-04-13 DIAGNOSIS — R918 Other nonspecific abnormal finding of lung field: Secondary | ICD-10-CM | POA: Diagnosis not present

## 2020-04-13 DIAGNOSIS — I130 Hypertensive heart and chronic kidney disease with heart failure and stage 1 through stage 4 chronic kidney disease, or unspecified chronic kidney disease: Secondary | ICD-10-CM | POA: Diagnosis present

## 2020-04-13 DIAGNOSIS — R571 Hypovolemic shock: Secondary | ICD-10-CM | POA: Diagnosis present

## 2020-04-13 DIAGNOSIS — I5021 Acute systolic (congestive) heart failure: Secondary | ICD-10-CM

## 2020-04-13 DIAGNOSIS — Z1623 Resistance to quinolones and fluoroquinolones: Secondary | ICD-10-CM | POA: Diagnosis present

## 2020-04-13 DIAGNOSIS — E1152 Type 2 diabetes mellitus with diabetic peripheral angiopathy with gangrene: Secondary | ICD-10-CM | POA: Diagnosis present

## 2020-04-13 DIAGNOSIS — R7881 Bacteremia: Secondary | ICD-10-CM | POA: Diagnosis not present

## 2020-04-13 DIAGNOSIS — R600 Localized edema: Secondary | ICD-10-CM | POA: Diagnosis not present

## 2020-04-13 DIAGNOSIS — N179 Acute kidney failure, unspecified: Secondary | ICD-10-CM | POA: Diagnosis present

## 2020-04-13 DIAGNOSIS — A419 Sepsis, unspecified organism: Secondary | ICD-10-CM | POA: Diagnosis not present

## 2020-04-13 DIAGNOSIS — L89153 Pressure ulcer of sacral region, stage 3: Secondary | ICD-10-CM | POA: Diagnosis present

## 2020-04-13 DIAGNOSIS — R0602 Shortness of breath: Secondary | ICD-10-CM | POA: Diagnosis not present

## 2020-04-13 DIAGNOSIS — I81 Portal vein thrombosis: Secondary | ICD-10-CM | POA: Diagnosis not present

## 2020-04-13 DIAGNOSIS — R531 Weakness: Secondary | ICD-10-CM | POA: Diagnosis not present

## 2020-04-13 DIAGNOSIS — N289 Disorder of kidney and ureter, unspecified: Secondary | ICD-10-CM | POA: Diagnosis not present

## 2020-04-13 DIAGNOSIS — G9340 Encephalopathy, unspecified: Secondary | ICD-10-CM | POA: Diagnosis not present

## 2020-04-13 DIAGNOSIS — I82890 Acute embolism and thrombosis of other specified veins: Secondary | ICD-10-CM | POA: Diagnosis not present

## 2020-04-13 DIAGNOSIS — G9341 Metabolic encephalopathy: Secondary | ICD-10-CM | POA: Diagnosis present

## 2020-04-13 DIAGNOSIS — Z66 Do not resuscitate: Secondary | ICD-10-CM | POA: Diagnosis present

## 2020-04-13 DIAGNOSIS — K8020 Calculus of gallbladder without cholecystitis without obstruction: Secondary | ICD-10-CM | POA: Diagnosis not present

## 2020-04-13 DIAGNOSIS — Z681 Body mass index (BMI) 19 or less, adult: Secondary | ICD-10-CM | POA: Diagnosis not present

## 2020-04-13 DIAGNOSIS — I5043 Acute on chronic combined systolic (congestive) and diastolic (congestive) heart failure: Secondary | ICD-10-CM | POA: Diagnosis not present

## 2020-04-13 DIAGNOSIS — E43 Unspecified severe protein-calorie malnutrition: Secondary | ICD-10-CM | POA: Diagnosis present

## 2020-04-13 DIAGNOSIS — I7090 Unspecified atherosclerosis: Secondary | ICD-10-CM | POA: Diagnosis not present

## 2020-04-13 DIAGNOSIS — R64 Cachexia: Secondary | ICD-10-CM | POA: Diagnosis present

## 2020-04-13 DIAGNOSIS — E872 Acidosis: Secondary | ICD-10-CM | POA: Diagnosis present

## 2020-04-13 DIAGNOSIS — I82551 Chronic embolism and thrombosis of right peroneal vein: Secondary | ICD-10-CM | POA: Diagnosis present

## 2020-04-13 DIAGNOSIS — L538 Other specified erythematous conditions: Secondary | ICD-10-CM | POA: Diagnosis not present

## 2020-04-13 DIAGNOSIS — J9601 Acute respiratory failure with hypoxia: Secondary | ICD-10-CM | POA: Diagnosis present

## 2020-04-13 DIAGNOSIS — Z515 Encounter for palliative care: Secondary | ICD-10-CM | POA: Diagnosis not present

## 2020-04-13 DIAGNOSIS — E860 Dehydration: Secondary | ICD-10-CM | POA: Diagnosis present

## 2020-04-13 DIAGNOSIS — D696 Thrombocytopenia, unspecified: Secondary | ICD-10-CM | POA: Diagnosis not present

## 2020-04-13 DIAGNOSIS — R0600 Dyspnea, unspecified: Secondary | ICD-10-CM | POA: Diagnosis not present

## 2020-04-13 DIAGNOSIS — J9811 Atelectasis: Secondary | ICD-10-CM | POA: Diagnosis not present

## 2020-04-13 DIAGNOSIS — J811 Chronic pulmonary edema: Secondary | ICD-10-CM | POA: Diagnosis not present

## 2020-04-13 DIAGNOSIS — I998 Other disorder of circulatory system: Secondary | ICD-10-CM | POA: Diagnosis not present

## 2020-04-13 DIAGNOSIS — J449 Chronic obstructive pulmonary disease, unspecified: Secondary | ICD-10-CM | POA: Diagnosis not present

## 2020-04-13 DIAGNOSIS — R609 Edema, unspecified: Secondary | ICD-10-CM | POA: Diagnosis not present

## 2020-04-13 DIAGNOSIS — K7200 Acute and subacute hepatic failure without coma: Secondary | ICD-10-CM | POA: Diagnosis not present

## 2020-04-13 DIAGNOSIS — E7220 Disorder of urea cycle metabolism, unspecified: Secondary | ICD-10-CM | POA: Diagnosis present

## 2020-04-13 DIAGNOSIS — L8990 Pressure ulcer of unspecified site, unspecified stage: Secondary | ICD-10-CM | POA: Insufficient documentation

## 2020-04-13 LAB — CBC
HCT: 26.1 % — ABNORMAL LOW (ref 36.0–46.0)
HCT: 26.6 % — ABNORMAL LOW (ref 36.0–46.0)
Hemoglobin: 7.6 g/dL — ABNORMAL LOW (ref 12.0–15.0)
Hemoglobin: 7.8 g/dL — ABNORMAL LOW (ref 12.0–15.0)
MCH: 23.2 pg — ABNORMAL LOW (ref 26.0–34.0)
MCH: 23.1 pg — ABNORMAL LOW (ref 26.0–34.0)
MCHC: 29.1 g/dL — ABNORMAL LOW (ref 30.0–36.0)
MCHC: 29.3 g/dL — ABNORMAL LOW (ref 30.0–36.0)
MCV: 79.6 fL — ABNORMAL LOW (ref 80.0–100.0)
MCV: 78.9 fL — ABNORMAL LOW (ref 80.0–100.0)
Platelets: UNDETERMINED 10*3/uL (ref 150–400)
Platelets: 120 10*3/uL — ABNORMAL LOW (ref 150–400)
RBC: 3.28 MIL/uL — ABNORMAL LOW (ref 3.87–5.11)
RBC: 3.37 MIL/uL — ABNORMAL LOW (ref 3.87–5.11)
RDW: 27.2 % — ABNORMAL HIGH (ref 11.5–15.5)
RDW: 27.3 % — ABNORMAL HIGH (ref 11.5–15.5)
WBC: 5.4 10*3/uL (ref 4.0–10.5)
WBC: 4.6 10*3/uL (ref 4.0–10.5)
nRBC: 0.4 % — ABNORMAL HIGH (ref 0.0–0.2)
nRBC: 0.7 % — ABNORMAL HIGH (ref 0.0–0.2)

## 2020-04-13 LAB — I-STAT ARTERIAL BLOOD GAS, ED
Acid-base deficit: 14 mmol/L — ABNORMAL HIGH (ref 0.0–2.0)
Acid-base deficit: 12 mmol/L — ABNORMAL HIGH (ref 0.0–2.0)
Bicarbonate: 10.4 mmol/L — ABNORMAL LOW (ref 20.0–28.0)
Bicarbonate: 11.7 mmol/L — ABNORMAL LOW (ref 20.0–28.0)
Calcium, Ion: 1.08 mmol/L — ABNORMAL LOW (ref 1.15–1.40)
Calcium, Ion: 0.98 mmol/L — ABNORMAL LOW (ref 1.15–1.40)
HCT: 23 % — ABNORMAL LOW (ref 36.0–46.0)
HCT: 24 % — ABNORMAL LOW (ref 36.0–46.0)
Hemoglobin: 7.8 g/dL — ABNORMAL LOW (ref 12.0–15.0)
Hemoglobin: 8.2 g/dL — ABNORMAL LOW (ref 12.0–15.0)
O2 Saturation: 97 %
O2 Saturation: 89 %
Patient temperature: 94.7
Potassium: 2.8 mmol/L — ABNORMAL LOW (ref 3.5–5.1)
Potassium: 3 mmol/L — ABNORMAL LOW (ref 3.5–5.1)
Sodium: 135 mmol/L (ref 135–145)
Sodium: 137 mmol/L (ref 135–145)
TCO2: 11 mmol/L — ABNORMAL LOW (ref 22–32)
TCO2: 12 mmol/L — ABNORMAL LOW (ref 22–32)
pCO2 arterial: 18 mmHg — CL (ref 32.0–48.0)
pCO2 arterial: 20.4 mmHg — ABNORMAL LOW (ref 32.0–48.0)
pH, Arterial: 7.358 (ref 7.350–7.450)
pH, Arterial: 7.365 (ref 7.350–7.450)
pO2, Arterial: 79 mmHg — ABNORMAL LOW (ref 83.0–108.0)
pO2, Arterial: 57 mmHg — ABNORMAL LOW (ref 83.0–108.0)

## 2020-04-13 LAB — LACTIC ACID, PLASMA
Lactic Acid, Venous: 5.4 mmol/L (ref 0.5–1.9)
Lactic Acid, Venous: 6 mmol/L (ref 0.5–1.9)
Lactic Acid, Venous: 6 mmol/L (ref 0.5–1.9)
Lactic Acid, Venous: 6.2 mmol/L (ref 0.5–1.9)
Lactic Acid, Venous: 7.1 mmol/L (ref 0.5–1.9)

## 2020-04-13 LAB — CBC WITH DIFFERENTIAL/PLATELET
Abs Immature Granulocytes: 0.02 10*3/uL (ref 0.00–0.07)
Basophils Absolute: 0 10*3/uL (ref 0.0–0.1)
Basophils Relative: 0 %
Eosinophils Absolute: 0 10*3/uL (ref 0.0–0.5)
Eosinophils Relative: 0 %
HCT: 33.3 % — ABNORMAL LOW (ref 36.0–46.0)
Hemoglobin: 9.4 g/dL — ABNORMAL LOW (ref 12.0–15.0)
Immature Granulocytes: 0 %
Lymphocytes Relative: 8 %
Lymphs Abs: 0.8 10*3/uL (ref 0.7–4.0)
MCH: 22.7 pg — ABNORMAL LOW (ref 26.0–34.0)
MCHC: 28.2 g/dL — ABNORMAL LOW (ref 30.0–36.0)
MCV: 80.4 fL (ref 80.0–100.0)
Monocytes Absolute: 0.4 10*3/uL (ref 0.1–1.0)
Monocytes Relative: 4 %
Neutro Abs: 8.3 10*3/uL — ABNORMAL HIGH (ref 1.7–7.7)
Neutrophils Relative %: 88 %
Platelets: 137 10*3/uL — ABNORMAL LOW (ref 150–400)
RBC: 4.14 MIL/uL (ref 3.87–5.11)
RDW: 27.8 % — ABNORMAL HIGH (ref 11.5–15.5)
WBC: 9.5 10*3/uL (ref 4.0–10.5)
nRBC: 0.3 % — ABNORMAL HIGH (ref 0.0–0.2)

## 2020-04-13 LAB — CBG MONITORING, ED
Glucose-Capillary: 115 mg/dL — ABNORMAL HIGH (ref 70–99)
Glucose-Capillary: 141 mg/dL — ABNORMAL HIGH (ref 70–99)
Glucose-Capillary: 187 mg/dL — ABNORMAL HIGH (ref 70–99)
Glucose-Capillary: 72 mg/dL (ref 70–99)
Glucose-Capillary: 73 mg/dL (ref 70–99)
Glucose-Capillary: 97 mg/dL (ref 70–99)

## 2020-04-13 LAB — COMPREHENSIVE METABOLIC PANEL
ALT: 26 U/L (ref 0–44)
ALT: 28 U/L (ref 0–44)
AST: 46 U/L — ABNORMAL HIGH (ref 15–41)
AST: 65 U/L — ABNORMAL HIGH (ref 15–41)
Albumin: 1.2 g/dL — ABNORMAL LOW (ref 3.5–5.0)
Albumin: 1.3 g/dL — ABNORMAL LOW (ref 3.5–5.0)
Alkaline Phosphatase: 142 U/L — ABNORMAL HIGH (ref 38–126)
Alkaline Phosphatase: 178 U/L — ABNORMAL HIGH (ref 38–126)
Anion gap: 16 — ABNORMAL HIGH (ref 5–15)
Anion gap: 16 — ABNORMAL HIGH (ref 5–15)
BUN: 45 mg/dL — ABNORMAL HIGH (ref 8–23)
BUN: 46 mg/dL — ABNORMAL HIGH (ref 8–23)
CO2: 11 mmol/L — ABNORMAL LOW (ref 22–32)
CO2: 12 mmol/L — ABNORMAL LOW (ref 22–32)
Calcium: 6.9 mg/dL — ABNORMAL LOW (ref 8.9–10.3)
Calcium: 7.5 mg/dL — ABNORMAL LOW (ref 8.9–10.3)
Chloride: 106 mmol/L (ref 98–111)
Chloride: 108 mmol/L (ref 98–111)
Creatinine, Ser: 2.41 mg/dL — ABNORMAL HIGH (ref 0.44–1.00)
Creatinine, Ser: 2.68 mg/dL — ABNORMAL HIGH (ref 0.44–1.00)
GFR, Estimated: 17 mL/min — ABNORMAL LOW (ref >60)
GFR, Estimated: 19 mL/min — ABNORMAL LOW (ref >60)
Glucose, Bld: 159 mg/dL — ABNORMAL HIGH (ref 70–99)
Glucose, Bld: 72 mg/dL (ref 70–99)
Potassium: 3 mmol/L — ABNORMAL LOW (ref 3.5–5.1)
Potassium: 3.2 mmol/L — ABNORMAL LOW (ref 3.5–5.1)
Sodium: 134 mmol/L — ABNORMAL LOW (ref 135–145)
Sodium: 135 mmol/L (ref 135–145)
Total Bilirubin: 0.5 mg/dL (ref 0.3–1.2)
Total Bilirubin: 0.6 mg/dL (ref 0.3–1.2)
Total Protein: 4.1 g/dL — ABNORMAL LOW (ref 6.5–8.1)
Total Protein: 4.8 g/dL — ABNORMAL LOW (ref 6.5–8.1)

## 2020-04-13 LAB — RESPIRATORY PANEL BY RT PCR (FLU A&B, COVID)
Influenza A by PCR: NEGATIVE
Influenza B by PCR: NEGATIVE
SARS Coronavirus 2 by RT PCR: NEGATIVE

## 2020-04-13 LAB — BLOOD CULTURE ID PANEL (REFLEXED) - BCID2

## 2020-04-13 LAB — GLUCOSE, CAPILLARY
Glucose-Capillary: 72 mg/dL (ref 70–99)
Glucose-Capillary: 92 mg/dL (ref 70–99)
Glucose-Capillary: 107 mg/dL — ABNORMAL HIGH (ref 70–99)

## 2020-04-13 LAB — RAPID URINE DRUG SCREEN, HOSP PERFORMED
Amphetamines: NOT DETECTED
Barbiturates: NOT DETECTED
Benzodiazepines: NOT DETECTED
Cocaine: NOT DETECTED
Opiates: NOT DETECTED
Tetrahydrocannabinol: NOT DETECTED

## 2020-04-13 LAB — ETHANOL: Alcohol, Ethyl (B): <10 mg/dL (ref <10)

## 2020-04-13 LAB — URINALYSIS, ROUTINE W REFLEX MICROSCOPIC
Bilirubin Urine: NEGATIVE
Glucose, UA: NEGATIVE mg/dL
Hgb urine dipstick: NEGATIVE
Ketones, ur: NEGATIVE mg/dL
Nitrite: NEGATIVE
Protein, ur: 30 mg/dL — AB
Specific Gravity, Urine: 1.019 (ref 1.005–1.030)
pH: 5 (ref 5.0–8.0)

## 2020-04-13 LAB — ECHOCARDIOGRAM COMPLETE
Area-P 1/2: 3.12 cm2
Height: 65 in
S' Lateral: 2.5 cm
Weight: 1792 oz

## 2020-04-13 LAB — TSH: TSH: 9.921 u[IU]/mL — ABNORMAL HIGH (ref 0.350–4.500)

## 2020-04-13 LAB — PHOSPHORUS: Phosphorus: 5.4 mg/dL — ABNORMAL HIGH (ref 2.5–4.6)

## 2020-04-13 LAB — MAGNESIUM: Magnesium: 1.6 mg/dL — ABNORMAL LOW (ref 1.7–2.4)

## 2020-04-13 LAB — AMMONIA: Ammonia: 51 umol/L — ABNORMAL HIGH (ref 9–35)

## 2020-04-13 LAB — CK: Total CK: 392 U/L — ABNORMAL HIGH (ref 38–234)

## 2020-04-13 LAB — CREATININE, SERUM
Creatinine, Ser: 2.58 mg/dL — ABNORMAL HIGH (ref 0.44–1.00)
GFR, Estimated: 18 mL/min — ABNORMAL LOW (ref >60)

## 2020-04-13 LAB — MRSA PCR SCREENING: MRSA by PCR: NEGATIVE

## 2020-04-13 LAB — PROCALCITONIN: Procalcitonin: 4.81 ng/mL

## 2020-04-13 MED ORDER — IOHEXOL 9 MG/ML PO SOLN: 500.0000 mL | ORAL | Status: AC

## 2020-04-13 MED ORDER — ONDANSETRON HCL 4 MG/2ML IJ SOLN: 4.0000 mg | Freq: Four times a day (QID) | INTRAMUSCULAR | Status: DC | PRN

## 2020-04-13 MED ORDER — PHENYLEPHRINE HCL-NACL 10-0.9 MG/250ML-% IV SOLN
INTRAVENOUS | Status: AC
  Filled 2020-04-13: qty 250

## 2020-04-13 MED ORDER — POLYETHYLENE GLYCOL 3350 17 G PO PACK: 17.0000 g | PACK | Freq: Every day | ORAL | Status: DC | PRN

## 2020-04-13 MED ORDER — DOCUSATE SODIUM 100 MG PO CAPS: 100.0000 mg | ORAL_CAPSULE | Freq: Two times a day (BID) | ORAL | Status: DC | PRN

## 2020-04-13 MED ORDER — SODIUM BICARBONATE 8.4 % IV SOLN
INTRAVENOUS | Status: DC
  Filled 2020-04-13 (×5): qty 850
  Filled 2020-04-13: qty 150

## 2020-04-13 MED ORDER — PIPERACILLIN-TAZOBACTAM 3.375 G IVPB 30 MIN
3.3750 g | Freq: Once | INTRAVENOUS | Status: AC
  Administered 2020-04-13: 3.375 g via INTRAVENOUS
  Filled 2020-04-13: qty 50

## 2020-04-13 MED ORDER — VANCOMYCIN VARIABLE DOSE PER UNSTABLE RENAL FUNCTION (PHARMACIST DOSING): Status: DC

## 2020-04-13 MED ORDER — PIPERACILLIN-TAZOBACTAM IN DEX 2-0.25 GM/50ML IV SOLN
2.2500 g | Freq: Three times a day (TID) | INTRAVENOUS | Status: DC
  Administered 2020-04-13 – 2020-04-14 (×4): 2.25 g via INTRAVENOUS
  Filled 2020-04-13 (×6): qty 50

## 2020-04-13 MED ORDER — PHENYLEPHRINE HCL-NACL 10-0.9 MG/250ML-% IV SOLN
25.0000 ug/min | INTRAVENOUS | Status: DC
  Administered 2020-04-13: 45 ug/min via INTRAVENOUS
  Administered 2020-04-13 (×2): 35 ug/min via INTRAVENOUS
  Administered 2020-04-13 – 2020-04-14 (×2): 25 ug/min via INTRAVENOUS
  Administered 2020-04-14: 45 ug/min via INTRAVENOUS
  Administered 2020-04-14: 30 ug/min via INTRAVENOUS
  Filled 2020-04-13 (×2): qty 250
  Filled 2020-04-13: qty 500
  Filled 2020-04-13 (×4): qty 250

## 2020-04-13 MED ORDER — LACTATED RINGERS IV BOLUS
2500.0000 mL | Freq: Once | INTRAVENOUS | Status: AC
  Administered 2020-04-13: 2000 mL via INTRAVENOUS

## 2020-04-13 MED ORDER — MAGNESIUM SULFATE 4 GM/100ML IV SOLN
4.0000 g | Freq: Once | INTRAVENOUS | Status: AC
  Administered 2020-04-13: 4 g via INTRAVENOUS
  Filled 2020-04-13: qty 100

## 2020-04-13 MED ORDER — ENOXAPARIN SODIUM 30 MG/0.3ML ~~LOC~~ SOLN
30.0000 mg | SUBCUTANEOUS | Status: DC
  Administered 2020-04-13 – 2020-04-14 (×2): 30 mg via SUBCUTANEOUS
  Filled 2020-04-13 (×2): qty 0.3

## 2020-04-13 MED ORDER — IOHEXOL 9 MG/ML PO SOLN
ORAL | Status: AC
  Filled 2020-04-13: qty 500

## 2020-04-13 MED ORDER — SODIUM CHLORIDE 0.9 % IV BOLUS
1000.0000 mL | Freq: Once | INTRAVENOUS | Status: AC
  Administered 2020-04-13: 1000 mL via INTRAVENOUS

## 2020-04-13 MED ORDER — LACTATED RINGERS IV SOLN: INTRAVENOUS | Status: DC

## 2020-04-13 MED ORDER — VANCOMYCIN HCL IN DEXTROSE 1-5 GM/200ML-% IV SOLN
1000.0000 mg | Freq: Once | INTRAVENOUS | Status: AC
  Administered 2020-04-13: 1000 mg via INTRAVENOUS
  Filled 2020-04-13: qty 200

## 2020-04-13 MED ORDER — SODIUM CHLORIDE 0.9 % IV SOLN
250.0000 mL | INTRAVENOUS | Status: DC
  Administered 2020-04-13 – 2020-04-24 (×2): 250 mL via INTRAVENOUS

## 2020-04-13 MED ORDER — CHLORHEXIDINE GLUCONATE CLOTH 2 % EX PADS
6.0000 | MEDICATED_PAD | Freq: Every day | CUTANEOUS | Status: DC
  Administered 2020-04-13 – 2020-05-02 (×17): 6 via TOPICAL

## 2020-04-13 MED ORDER — POTASSIUM CHLORIDE CRYS ER 20 MEQ PO TBCR
40.0000 meq | EXTENDED_RELEASE_TABLET | Freq: Once | ORAL | Status: AC
  Administered 2020-04-13: 40 meq via ORAL
  Filled 2020-04-13: qty 2

## 2020-04-13 MED ORDER — LACTATED RINGERS IV BOLUS
1000.0000 mL | Freq: Once | INTRAVENOUS | Status: AC
  Administered 2020-04-13: 1000 mL via INTRAVENOUS

## 2020-04-13 MED ORDER — PNEUMOCOCCAL VAC POLYVALENT 25 MCG/0.5ML IJ INJ
0.5000 mL | INJECTION | INTRAMUSCULAR | Status: DC
  Filled 2020-04-13: qty 0.5

## 2020-04-13 MED ORDER — STERILE WATER FOR INJECTION IV SOLN
INTRAVENOUS | Status: DC
  Filled 2020-04-13 (×2): qty 850

## 2020-04-13 NOTE — Progress Notes (Signed)
Pharmacy Antibiotic Note  Michelle Avila is a 74 y.o. female admitted on 04/12/2020 with sepsis of unknown origin and is currently on vasopressors. Patient has cellulitis and possible IAI. Pharmacy has been consulted for Zosyn dosing. Lab called with blood culture results: GPC and GNR in 2 bottles of 1 set with BCID showing Staph species no mec A as well as GNR in 1 bottle of other set at this point. After discussion with Dr. Vaughan Browner, will add vancomycin. Patient is noted to have AKI with SCr significantly eleveated at 2.41 though baseline unknown.   Plan: Continue Zosyn 2.25 g q 8 hours  Vancomycin 1000 mg iv once. Will dose by level at this point.   F/U renal function, culture results, clinical course  Height: 5\' 5"  (165.1 cm) Weight: 50.8 kg (112 lb) IBW/kg (Calculated) : 57  Temp (24hrs), Avg:95 F (35 C), Min:89.9 F (32.2 C), Max:99.1 F (37.3 C)  Recent Labs  Lab 04/12/20 2319 04/12/20 2349 04/13/20 0044 04/13/20 0310 04/13/20 0655  WBC  --  9.5  --  5.4 4.6  CREATININE  --  2.68*  --  2.58* 2.41*  LATICACIDVEN 5.4*  --  6.2* 7.1* 6.0*    Estimated Creatinine Clearance: 16.2 mL/min (A) (by C-G formula based on SCr of 2.41 mg/dL (H)).    Not on File  Antimicrobials this admission: 10/17 Zosyn >>  10/17 vancomycin >>   Dose adjustments this admission:  Microbiology results: 10/17 BCx: GPC and GNR 2 bottles of 1 set with Staph species no mecA per BCID; GNR in 1 bottle of second  10/17 UCx:   C diff:  GI PCR:  10/17 MRSA PCR: neg 10/17 Resp PCR: COVID - FLU A/B: -  Thank you for allowing pharmacy to be a part of this patient's care.  Napoleon Form 04/13/2020 6:53 PM

## 2020-04-13 NOTE — Progress Notes (Signed)
  Echocardiogram 2D Echocardiogram has been performed.  Fidel Levy 04/13/2020, 8:57 AM

## 2020-04-13 NOTE — H&P (Addendum)
NAME:  Michelle Avila, MRN:  [id number], DOB:  08/25/1945, LOS: 0 ADMISSION DATE:  04/12/2020, CONSULTATION DATE:  04/13/20 REFERRING MD:  Roxanne Mins - EM, CHIEF COMPLAINT:  AMS  Brief History   74 yo F with AMS, hypoglycemia dehydration AKI   History of present illness   75 yo F with hx hypothyroidism, presenting with AMS. Neighbors hadn't heard from pt x several days. Daughter came in from [place] of [place] and found pt on floor, in feces and very altered. EMS dispatched an gave pt D10 for finding of glucose 19.  In ED, pt felt to be dehydrated and has been given 2.5L IVF  CT H obtained, and is without acute changes. BCx and Ucx ordered but picture in ED more consistent with dehydration, hypoglycemia than infection and pt not started on abx.  Pt mentation largely improved in ED. She endorses recent med change -- is now on a diuretic (unknown which) for BLE edema.   Labs significant for Na 134 K 3.2 CO2 12 Glu 159 BUN 46 Cr 2.68  LA 5.4 WBC 9.5   Past Medical History    Significant Hospital Events     Consults:    Procedures:    Significant Diagnostic Tests:  CT H 10/17> chronic microvascular ischemic changes, mild atrophy related to age.  Micro Data:  10/17 SARS Cov2> neg 10/17 BCx> pending 10/17 UCx> pending  Antimicrobials:  10/17 zosyn >  Interim history/subjective:  Hypoglycemia is improving   Objective   Blood pressure (!) 103/55, pulse 65, temperature (!) 89.9 F (32.2 C), temperature source Rectal, resp. rate 17, SpO2 98 %.       No intake or output data in the 24 hours ending 04/13/20 0143 There were no vitals filed for this visit.  Examination: General: Thin frail older adult F, reclined in bed NAD HENT: NCAT Exophthalmus pink dry mm trachea midline Lungs: CTAb symmetrical chest expansion no accessory use on RA Cardiovascular: RRR s1s2 no rgm cap refill brisk Abdomen: soft flat ndnt + bowel sounds Extremities: BLE edema. No obvious joint deformity no  cyanosis or clubbing Neuro: AAOx3 following commands PERRL GU: defer Skin: Slightly jaundiced appearing. Thin, dry, + turgor. Scattered ecchymosis. BLE with chronic appearing cutaneous changes from vascular disease  Resolved Hospital Problem list     Assessment & Plan:   Acute encephalopathy -hypoglycemia, dehydration. Mild hyperammonemia . Less likely sepsis. Does endorse AMS with prior UTIs.  -CT H without acute changes P -continue correction of metabolic abnormalities - delirium precautions  -empiric zosyn --de-escalate pending Cx data or if PCT very reassuring (with AKI may be indeterminate)  AKI -suspected-- unknown baseline P -cont IVF. LR 293ml/hr, may need further bolus  -trend renal indices, UOP -check CK   AGMA Lactic acidosis -likely related to dehydration, AKI, presenting hypoglycemia. No leukocytosis P -cont supportive care  -empiric zosyn as above    Transaminitis, mild Hyperammonemia Slight jaundice - pt states she has hx of "fat in the liver" ?NASH P -trend LFTs PRN -defer correction of ammonia until pt hydration status better improved -trend ammonia and consider lactulose in AM -consider abd [place]   Hypothermia -Bair hugger -Check TSH   Bradycardia, improved -has improved with initiation of warming measures  BLE edema -ECHO (in setting of edema with significant overall dehydration)  -Lower extremity dopplers r/o DVT  Hypothyroidism -clarify home thyroid med, resume   Hypoglycemia -trend, supportive fluids as needed  Hypokalemia -replace, trend   Best practice:  Diet: Regular  Pain/Anxiety/Delirium protocol (if indicated): na VAP protocol (if indicated): na DVT prophylaxis: lovenox  GI prophylaxis: na Glucose control: monitor  Mobility: BR Code Status: Full  Family Communication: pending Disposition: ICU -- follow up AM labs, hopefully will be able to de-escalate bed status pending some metabolic improvement  Labs   CBC: Recent  Labs  Lab 04/12/20 2349  WBC 9.5  NEUTROABS 8.3*  HGB 9.4*  HCT 33.3*  MCV 80.4  PLT 137*    Basic Metabolic Panel: Recent Labs  Lab 04/12/20 2349  NA 134*  K 3.2*  CL 106  CO2 12*  GLUCOSE 159*  BUN 46*  CREATININE 2.68*  CALCIUM 7.5*   GFR: CrCl cannot be calculated (Unknown ideal weight.). Recent Labs  Lab 04/12/20 2319 04/12/20 2349 04/13/20 0044  WBC  --  9.5  --   LATICACIDVEN 5.4*  --  6.2*    Liver Function Tests: Recent Labs  Lab 04/12/20 2349  AST 46*  ALT 26  ALKPHOS 178*  BILITOT 0.6  PROT 4.8*  ALBUMIN 1.3*   No results for input(s): LIPASE, AMYLASE in the last 168 hours. Recent Labs  Lab 04/13/20 0043  AMMONIA 51*    ABG No results found for: PHART, PCO2ART, PO2ART, HCO3, TCO2, ACIDBASEDEF, O2SAT   Coagulation Profile: No results for input(s): INR, PROTIME in the last 168 hours.  Cardiac Enzymes: No results for input(s): CKTOTAL, CKMB, CKMBINDEX, TROPONINI in the last 168 hours.  HbA1C: No results found for: HGBA1C  CBG: Recent Labs  Lab 04/12/20 2325 04/12/20 2337 04/13/20 0015 04/13/20 0112  GLUCAP 19* 312* 115* 187*    Review of Systems:   + weakness, fatigue, incontinence  -SOB chest pain, palpitations, n/v/d abdominal pain, coughing wheezing URI sx, fever chills, HA LOC, changes to skin hair nails   Past Medical History  She,  has no past medical history on file.  Hypothyroidism  Surgical History     Social History      Family History   Her family history is not on file.   Allergies Not on File   Home Medications  Prior to Admission medications   Not on File     Critical care time: 45 min    CRITICAL CARE Performed by: Cristal Generous   Total critical care time: 45 minutes  Critical care time was exclusive of separately billable procedures and treating other patients.  Critical care was necessary to treat or prevent imminent or life-threatening deterioration.  Critical care was time  spent personally by me on the following activities: development of treatment plan with patient and/or surrogate as well as nursing, discussions with consultants, evaluation of patient's response to treatment, examination of patient, obtaining history from patient or surrogate, ordering and performing treatments and interventions, ordering and review of laboratory studies, ordering and review of radiographic studies, pulse oximetry and re-evaluation of patient's condition.  Eliseo Gum MSN, AGACNP-BC [place] [phone] If no answer, [phone] 04/13/2020, 2:42 AM  Chart reviewed.  Critical care plan discussed with nurse practitioner Bowser at length.  Agree with above documentation and plan in total. Lonia Chimera

## 2020-04-13 NOTE — ED Notes (Signed)
This RN called RT for obtaining ABG

## 2020-04-13 NOTE — Progress Notes (Signed)
Pharmacy Antibiotic Note  Michelle Avila is a 74 y.o. female admitted on 04/12/2020 with sepsis.  Pharmacy has been consulted for zosyn dosing.  Plan: Zosyn 3.375 gm IV x 1 then 2.25 gm IV q8 hours F/u renal function, cultures and clinical course  Height: 5\' 5"  (165.1 cm) Weight: 50.8 kg (112 lb) IBW/kg (Calculated) : 57  Temp (24hrs), Avg:94.8 F (34.9 C), Min:89.9 F (32.2 C), Max:99.1 F (37.3 C)  Recent Labs  Lab 04/12/20 2319 04/12/20 2349 04/13/20 0044 04/13/20 0310  WBC  --  9.5  --  5.4  CREATININE  --  2.68*  --  2.58*  LATICACIDVEN 5.4*  --  6.2* 7.1*    Estimated Creatinine Clearance: 15.1 mL/min (A) (by C-G formula based on SCr of 2.58 mg/dL (H)).    Not on File  Thank you for allowing pharmacy to be a part of this patient's care.  Excell Seltzer P[place]et 04/13/2020 6:40 AM

## 2020-04-13 NOTE — ED Notes (Signed)
Purewick placed on pt. 

## 2020-04-13 NOTE — ED Notes (Signed)
RN messaged pharmacy for missing sodium bicarbonate mix. Pharmacy replied that medication is currently being mixed

## 2020-04-13 NOTE — ED Notes (Signed)
Echo at bedside

## 2020-04-13 NOTE — Progress Notes (Signed)
Portion of admission assessment completed, but unable to finish due to intermittent confusion.

## 2020-04-13 NOTE — Progress Notes (Addendum)
Day team critical care note  Patient is a 74 year old woman with past medical history of hypothyroidism who is presenting with significant lactic acidosis of unclear origin.  She was started on broad-spectrum antibiotics and given fluid resuscitation last night.  Her blood pressure remains low and she is now on pressors.  She currently is confused, having persistent hypoglycemia.  She denies any pain.  On exam she has diffuse anasarca, has temporal wasting and appears to be in chronically poor health.  Her left lower extremity is red and warm to touch.  There is no obvious tracking up the groin.  Her abdominal exam is benign.  Assessment -Hypovolemic and septic shock of unclear origin with inability to clear lactate which is concerning for lack of source control.  Her urinalysis is unimpressive.  Her abdominal exam is likewise not very impressive but she is altered limiting utility.  She does have a left lower extremity cellulitis.  -Chronic protein calorie malnutrition, severe.  Plan -Abdomen/pelvis CT with oral contrast if able to see if any obvious signs of intestinal ischemia. -Echo, Pct -Switch bicarb drip to D5 bicarb drip to help with hypoglycemia -Continue peripheral pressors, will consider central line if persistent need -Monitor mental status closely, high risk for needing intubation -Left leg CT looking for obvious signs of necrotizing fasciitis, clinical exam is not as impressive -Continue Zosyn for now -Guarded prognosis, instructed RN to contact me via epic chat if any issues while she is boarding in the emergency department -If there are staffed ICU beds at [facility], she can be sent there.  Additional 45 minutes critical care time Erskine Emery MD critical care

## 2020-04-13 NOTE — Progress Notes (Addendum)
PCCM progress note  Patient seen on arrival at [facility] She is more awake and alert now and protecting airway On 35 mcg of Neo-Synephrine which is being weaned.  No need for central line  CT abdomen pelvis reviewed with bowel thickening in the colon, possible ischemic colitis with additional findings indicating possible pancreatitis.  Small loculations of gas and ureters along with other findings No osteomyelitis on CT leg.  Suspect she may have either infectious colitis or mild ischemic colitis from hypotension And cellulitis of LE We will give additional fluids, wean off pressors Continue antibiotics Check stool for GI pathogens, C. difficile  Rest of plan as before  Marshell Garfinkel MD [facility] Please see [email or web address] for pager details.  04/13/2020, 11:49 AM

## 2020-04-13 NOTE — Progress Notes (Signed)
[place] Progress Note Patient Name: Michelle Avila DOB: 1945-11-25 MRN: [id number]   Date of Service  04/13/2020  HPI/Events of Note  ABG on room air = 7.358/18.0/79.0/10.4.   eICU Interventions  Plan: 1. NaHCO3 IV infusion to run at 50 mL/hour. 2. Repeat ABG at 7:30 AM.      Intervention Category Major Interventions: Acid-Base disturbance - evaluation and management  Jadden Yim [place] 04/13/2020, 4:04 AM

## 2020-04-13 NOTE — Progress Notes (Signed)
CRITICAL VALUE ALERT  Critical Value: lactic acid 6.0  Date & Time Notied:  85\50\15 2030  Provider Notified: [email or web address]-link  Orders Received/Actions taken: none at this time

## 2020-04-13 NOTE — Progress Notes (Signed)
[place] Progress Note Patient Name: Michelle Avila DOB: 1946/04/28 MRN: [id number]   Date of Service  04/13/2020  HPI/Events of Note  Hypotension - BP = 75/45.   eICU Interventions  Plan: 1. Phenylephrine IV infusion via PIV. Titrate to MAP >= 65.  2. Bolus with 0.9 NaCl 1 liter IV over 1 hour now.      Intervention Category Major Interventions: Hypotension - evaluation and management  Carlous Olivares [place] 04/13/2020, 3:49 AM

## 2020-04-14 ENCOUNTER — Inpatient Hospital Stay (HOSPITAL_COMMUNITY): Payer: Medicare Other

## 2020-04-14 DIAGNOSIS — R579 Shock, unspecified: Secondary | ICD-10-CM | POA: Diagnosis not present

## 2020-04-14 DIAGNOSIS — N179 Acute kidney failure, unspecified: Secondary | ICD-10-CM

## 2020-04-14 DIAGNOSIS — A419 Sepsis, unspecified organism: Secondary | ICD-10-CM | POA: Diagnosis not present

## 2020-04-14 DIAGNOSIS — E860 Dehydration: Secondary | ICD-10-CM | POA: Diagnosis not present

## 2020-04-14 DIAGNOSIS — L538 Other specified erythematous conditions: Secondary | ICD-10-CM

## 2020-04-14 DIAGNOSIS — R609 Edema, unspecified: Secondary | ICD-10-CM | POA: Diagnosis not present

## 2020-04-14 DIAGNOSIS — E43 Unspecified severe protein-calorie malnutrition: Secondary | ICD-10-CM

## 2020-04-14 DIAGNOSIS — E8809 Other disorders of plasma-protein metabolism, not elsewhere classified: Secondary | ICD-10-CM

## 2020-04-14 LAB — CBC WITH DIFFERENTIAL/PLATELET
Abs Immature Granulocytes: 0 10*3/uL (ref 0.00–0.07)
Band Neutrophils: 6 %
Basophils Absolute: 0 10*3/uL (ref 0.0–0.1)
Basophils Relative: 0 %
Eosinophils Absolute: 0 10*3/uL (ref 0.0–0.5)
Eosinophils Relative: 0 %
HCT: 18.8 % — ABNORMAL LOW (ref 36.0–46.0)
Hemoglobin: 6.1 g/dL — CL (ref 12.0–15.0)
Lymphocytes Relative: 2 %
Lymphs Abs: 0.5 10*3/uL — ABNORMAL LOW (ref 0.7–4.0)
MCH: 24.4 pg — ABNORMAL LOW (ref 26.0–34.0)
MCHC: 32.4 g/dL (ref 30.0–36.0)
MCV: 75.2 fL — ABNORMAL LOW (ref 80.0–100.0)
Monocytes Absolute: 0.8 10*3/uL (ref 0.1–1.0)
Monocytes Relative: 3 %
Neutro Abs: 26 10*3/uL — ABNORMAL HIGH (ref 1.7–7.7)
Neutrophils Relative %: 89 %
Platelets: 33 10*3/uL — ABNORMAL LOW (ref 150–400)
RBC: 2.5 MIL/uL — ABNORMAL LOW (ref 3.87–5.11)
RDW: 26.7 % — ABNORMAL HIGH (ref 11.5–15.5)
WBC: 27.4 10*3/uL — ABNORMAL HIGH (ref 4.0–10.5)
nRBC: 0 % (ref 0.0–0.2)

## 2020-04-14 LAB — COMPREHENSIVE METABOLIC PANEL
ALT: 49 U/L — ABNORMAL HIGH (ref 0–44)
AST: 145 U/L — ABNORMAL HIGH (ref 15–41)
Albumin: 3 g/dL — ABNORMAL LOW (ref 3.5–5.0)
Alkaline Phosphatase: 126 U/L (ref 38–126)
Anion gap: 15 (ref 5–15)
BUN: 42 mg/dL — ABNORMAL HIGH (ref 8–23)
CO2: 24 mmol/L (ref 22–32)
Calcium: 6.5 mg/dL — ABNORMAL LOW (ref 8.9–10.3)
Chloride: 96 mmol/L — ABNORMAL LOW (ref 98–111)
Creatinine, Ser: 1.98 mg/dL — ABNORMAL HIGH (ref 0.44–1.00)
GFR, Estimated: 24 mL/min — ABNORMAL LOW (ref >60)
Glucose, Bld: 76 mg/dL (ref 70–99)
Potassium: 3.5 mmol/L (ref 3.5–5.1)
Sodium: 135 mmol/L (ref 135–145)
Total Bilirubin: 1.2 mg/dL (ref 0.3–1.2)
Total Protein: 5 g/dL — ABNORMAL LOW (ref 6.5–8.1)

## 2020-04-14 LAB — GASTROINTESTINAL PANEL BY PCR, STOOL (REPLACES STOOL CULTURE)

## 2020-04-14 LAB — BLOOD GAS, ARTERIAL
Acid-Base Excess: 2.2 mmol/L — ABNORMAL HIGH (ref 0.0–2.0)
Bicarbonate: 25.9 mmol/L (ref 20.0–28.0)
O2 Saturation: 92.4 %
Patient temperature: 98.6
pCO2 arterial: 38 mmHg (ref 32.0–48.0)
pH, Arterial: 7.448 (ref 7.350–7.450)
pO2, Arterial: 72 mmHg — ABNORMAL LOW (ref 83.0–108.0)

## 2020-04-14 LAB — PROTIME-INR
INR: 1.6 — ABNORMAL HIGH (ref 0.8–1.2)
INR: 1.6 — ABNORMAL HIGH (ref 0.8–1.2)
Prothrombin Time: 18.2 seconds — ABNORMAL HIGH (ref 11.4–15.2)
Prothrombin Time: 18.3 seconds — ABNORMAL HIGH (ref 11.4–15.2)

## 2020-04-14 LAB — BASIC METABOLIC PANEL
Anion gap: 18 — ABNORMAL HIGH (ref 5–15)
BUN: 41 mg/dL — ABNORMAL HIGH (ref 8–23)
CO2: 19 mmol/L — ABNORMAL LOW (ref 22–32)
Calcium: 6.1 mg/dL — CL (ref 8.9–10.3)
Chloride: 98 mmol/L (ref 98–111)
Creatinine, Ser: 2.2 mg/dL — ABNORMAL HIGH (ref 0.44–1.00)
GFR, Estimated: 21 mL/min — ABNORMAL LOW (ref >60)
Glucose, Bld: 97 mg/dL (ref 70–99)
Potassium: 3 mmol/L — ABNORMAL LOW (ref 3.5–5.1)
Sodium: 135 mmol/L (ref 135–145)

## 2020-04-14 LAB — CBC
HCT: 25.9 % — ABNORMAL LOW (ref 36.0–46.0)
Hemoglobin: 8.1 g/dL — ABNORMAL LOW (ref 12.0–15.0)
MCH: 24.1 pg — ABNORMAL LOW (ref 26.0–34.0)
MCHC: 31.3 g/dL (ref 30.0–36.0)
MCV: 77.1 fL — ABNORMAL LOW (ref 80.0–100.0)
Platelets: 63 10*3/uL — ABNORMAL LOW (ref 150–400)
RBC: 3.36 MIL/uL — ABNORMAL LOW (ref 3.87–5.11)
RDW: 27 % — ABNORMAL HIGH (ref 11.5–15.5)
WBC: 28 10*3/uL — ABNORMAL HIGH (ref 4.0–10.5)
nRBC: 0 % (ref 0.0–0.2)

## 2020-04-14 LAB — URINE CULTURE: Culture: <10000 — AB

## 2020-04-14 LAB — C DIFFICILE QUICK SCREEN W PCR REFLEX
C Diff antigen: NEGATIVE
C Diff interpretation: NOT DETECTED
C Diff toxin: NEGATIVE

## 2020-04-14 LAB — FIBRINOGEN: Fibrinogen: 298 mg/dL (ref 210–475)

## 2020-04-14 LAB — LACTIC ACID, PLASMA
Lactic Acid, Venous: 2 mmol/L (ref 0.5–1.9)
Lactic Acid, Venous: 1.6 mmol/L (ref 0.5–1.9)

## 2020-04-14 LAB — PROCALCITONIN: Procalcitonin: 3.84 ng/mL

## 2020-04-14 LAB — GLUCOSE, CAPILLARY
Glucose-Capillary: 81 mg/dL (ref 70–99)
Glucose-Capillary: 82 mg/dL (ref 70–99)
Glucose-Capillary: 85 mg/dL (ref 70–99)
Glucose-Capillary: 86 mg/dL (ref 70–99)
Glucose-Capillary: 90 mg/dL (ref 70–99)

## 2020-04-14 LAB — APTT: aPTT: 66 seconds — ABNORMAL HIGH (ref 24–36)

## 2020-04-14 LAB — PREPARE RBC (CROSSMATCH)

## 2020-04-14 MED ORDER — LEVOTHYROXINE SODIUM 112 MCG PO TABS
112.0000 ug | ORAL_TABLET | Freq: Every day | ORAL | Status: DC
  Administered 2020-04-14 – 2020-05-02 (×16): 112 ug via ORAL
  Filled 2020-04-14 (×18): qty 1

## 2020-04-14 MED ORDER — POTASSIUM CHLORIDE 10 MEQ/100ML IV SOLN
10.0000 meq | INTRAVENOUS | Status: AC
  Administered 2020-04-14 – 2020-04-15 (×2): 10 meq via INTRAVENOUS
  Filled 2020-04-14 (×2): qty 100

## 2020-04-14 MED ORDER — CALCIUM GLUCONATE-NACL 1-0.675 GM/50ML-% IV SOLN
1.0000 g | Freq: Once | INTRAVENOUS | Status: AC
  Administered 2020-04-14: 1000 mg via INTRAVENOUS
  Filled 2020-04-14: qty 50

## 2020-04-14 MED ORDER — DEXTROSE-NACL 5-0.9 % IV SOLN: INTRAVENOUS | Status: DC

## 2020-04-14 MED ORDER — ALBUMIN HUMAN 25 % IV SOLN
50.0000 g | Freq: Four times a day (QID) | INTRAVENOUS | Status: AC
  Administered 2020-04-14 (×2): 50 g via INTRAVENOUS
  Filled 2020-04-14 (×2): qty 200

## 2020-04-14 MED ORDER — POTASSIUM CHLORIDE 10 MEQ/100ML IV SOLN
10.0000 meq | INTRAVENOUS | Status: AC
  Administered 2020-04-14 (×4): 10 meq via INTRAVENOUS
  Filled 2020-04-14 (×4): qty 100

## 2020-04-14 MED ORDER — CIPROFLOXACIN IN D5W 400 MG/200ML IV SOLN
400.0000 mg | INTRAVENOUS | Status: DC
  Administered 2020-04-14 – 2020-04-16 (×3): 400 mg via INTRAVENOUS
  Filled 2020-04-14 (×3): qty 200

## 2020-04-14 MED ORDER — SODIUM CHLORIDE 0.9% IV SOLUTION: Freq: Once | INTRAVENOUS | Status: AC

## 2020-04-14 MED ORDER — COLLAGENASE 250 UNIT/GM EX OINT
TOPICAL_OINTMENT | Freq: Every day | CUTANEOUS | Status: AC
  Administered 2020-04-21 – 2020-04-22 (×2): 1 via TOPICAL
  Filled 2020-04-14 (×4): qty 30

## 2020-04-14 NOTE — Progress Notes (Signed)
Pt has had no urine output for >8 hours. Bladder scan showed 250-270, e-link made aware.No new orders at this time

## 2020-04-14 NOTE — Progress Notes (Signed)
Positive of right leg DVT and on 1600 assessment RN could not locate pedal pulse with doppler and increased redness spreading up leg noted. Foot cool and pale to touch with loss of sensation. Popliteal pulse noted with doppler. Hunsucker MD paged to bedside to assess.

## 2020-04-14 NOTE — Progress Notes (Signed)
[place] Progress Note Patient Name: Michelle Avila DOB: Mar 14, 1946 MRN: [id number]   Date of Service  04/14/2020  HPI/Events of Note  K 3.0 (but in setting of Cr 2.20, eGFR 21).  Calcium 6.1 (but albumin y/day 1.2, resulting in a corrected calcium of ~ 8.7).   eICU Interventions  Give 1g calcium gluconate IV. No K repletion at this time given renal dysfunction.     Intervention Category Intermediate Interventions: Electrolyte abnormality - evaluation and management  Michelle Avila 04/14/2020, 5:26 AM

## 2020-04-14 NOTE — H&P (Addendum)
NAME:  Michelle Avila, MRN:  [id number], DOB:  1945-11-25, LOS: 1 ADMISSION DATE:  04/12/2020, CONSULTATION DATE:  04/13/20 REFERRING MD:  Roxanne Mins - EM, CHIEF COMPLAINT:  AMS  Brief History   74 yo F with AMS, hypoglycemia dehydration AKI   History of present illness   74 yo F with hx hypothyroidism, presenting with AMS. Neighbors hadn't heard from pt x several days. Daughter came in from out of state and found pt on floor, in feces and very altered. EMS dispatched an gave pt D10 for finding of glucose 19.  In ED, pt felt to be dehydrated and has been given 2.5L IVF  CT H obtained, and is without acute changes. BCx and Ucx ordered but picture in ED more consistent with dehydration, hypoglycemia than infection and pt not started on abx.  Pt mentation largely improved in ED. She endorses recent med change -- is now on a diuretic (unknown which) for BLE edema.   Labs significant for Na 134 K 3.2 CO2 12 Glu 159 BUN 46 Cr 2.68  LA 5.4 WBC 9.5   Past Medical History  Hypothyroid  Significant Hospital Events     Consults:    Procedures:    Significant Diagnostic Tests:  CT H 10/17> chronic microvascular ischemic changes, mild atrophy related to age.  Micro Data:  10/17 SARS Cov2> neg 10/17 BCx> 1 of 2 staph species, contaminant, 2 of 2 GNRs 10/17 UCx> <10k CFU 10/17: MRSA screen negative Antimicrobials:  10/17 zosyn >  Interim history/subjective:  Mental status improved. Cr imrpoving with the fluids but with AKI. CT abd/pelvis with diffuse anasarca and ascites.   Objective   Blood pressure 98/63, pulse 66, temperature (!) 97.5 F (36.4 C), temperature source Axillary, resp. rate 17, height 5\' 5"  (1.651 m), weight 49.7 kg, SpO2 100 %.        Intake/Output Summary (Last 24 hours) at 04/14/2020 0925 Last data filed at 04/14/2020 5638 Gross per 24 hour  Intake 5469.35 ml  Output 0 ml  Net 5469.35 ml   Filed Weights   04/13/20 0615 04/13/20 0638 04/14/20 0500   Weight: 45.4 kg 50.8 kg 49.7 kg    Examination: General: Thin frail older adult F, reclined in bed NAD HENT: NCAT Exophthalmus pink dry mm trachea midline Lungs: CTAb symmetrical chest expansion no accessory use on 2L [place] Cardiovascular: RRR s1s2 no rgm cap refill brisk Abdomen: soft flat ndnt + bowel sounds Extremities: significant pitting BLE edema. No obvious joint deformity no cyanosis or clubbing Neuro: AAOx3 following commands PERRL GU: defer Skin: Thin, dry, + turgor. Scattered ecchymosis. BLE with chronic appearing cutaneous changes from vascular disease  Resolved Hospital Problem list     Assessment & Plan:   Acute encephalopathy: multifactorial including hypoglycemia, dehydration, sepsis from presumed colitis.  -CTH without acute changes P -continue correction of metabolic abnormalities - delirium precautions  -empiric zosyn   Septic and hypovolemic shock: Poor PO intake, source of sepsis colitis with GNR bacteremia. P -Zosyn -Low dose phenylephrine -Albumin bolus x 2 today  AKI: suspected-- unknown baseline. Improving with IVF. Appears pretty overloaded, likely would benefit from lasix in coming days, Cr may worsen until can offload presumed venous congestion  P CTM, consider lasix  Lactic acidosis with likely starvation ketosis (notably no ketones in urine 04/13/20. Lactic acidosis persists raising concern for liver dysfunction.  P -cont supportive care  -empiric zosyn as above   Volume overload: suspected due to hypoalbuminemia, possible liver dysfunction. P -Diurese  once off pressors vs readdress tomorrow  Severe Protein calorie malnutrition: Appears to be wasting on exam. P -regular diet, consider nutrition consult   Transaminitis, mild Hyperammonemia Low albumin - pt states she has hx of "fat in the liver" ?NASH P -trend LFTs PRN -US liver doppler 10/18 -INR to further revaluate synthetic funtion  BLE edema -ECHO w/ normal EF, grade 2 ddfxn,  valves ok -Lower extremity dopplers r/o DVT  Hypothyroidism: TSH over 9,  -resume home synthroid -FT4 ordered  Best practice:  Diet: Regular  Pain/Anxiety/Delirium protocol (if indicated): na VAP protocol (if indicated): na DVT prophylaxis: lovenox  GI prophylaxis: na Glucose control: monitor  Mobility: OOB to chair Code Status: Full  Family Communication: discussed with patient Disposition: ICU  Labs   CBC: Recent Labs  Lab 04/12/20 2349 04/12/20 2349 04/13/20 0310 04/13/20 0352 04/13/20 0655 04/13/20 0832 04/14/20 0249  WBC 9.5  --  5.4  --  4.6  --  28.0*  NEUTROABS 8.3*  --   --   --   --   --   --   HGB 9.4*   < > 7.6* 7.8* 7.8* 8.2* 8.1*  HCT 33.3*   < > 26.1* 23.0* 26.6* 24.0* 25.9*  MCV 80.4  --  79.6*  --  78.9*  --  77.1*  PLT 137*  --  PLATELET CLUMPS NOTED ON SMEAR, UNABLE TO ESTIMATE  --  120*  --  63*   < > = values in this interval not displayed.    Basic Metabolic Panel: Recent Labs  Lab 04/12/20 2349 04/13/20 0310 04/13/20 0352 04/13/20 0655 04/13/20 0832 04/14/20 0249  NA 134*  --  135 135 137 135  K 3.2*  --  2.8* 3.0* 3.0* 3.0*  CL 106  --   --  108  --  98  CO2 12*  --   --  11*  --  19*  GLUCOSE 159*  --   --  72  --  97  BUN 46*  --   --  45*  --  41*  CREATININE 2.68* 2.58*  --  2.41*  --  2.20*  CALCIUM 7.5*  --   --  6.9*  --  6.1*  MG  --   --   --  1.6*  --   --   PHOS  --   --   --  5.4*  --   --    GFR: Estimated Creatinine Clearance: 17.3 mL/min (A) (by C-G formula based on SCr of 2.2 mg/dL (H)). Recent Labs  Lab 04/12/20 2319 04/12/20 2349 04/13/20 0044 04/13/20 0150 04/13/20 0310 04/13/20 0655 04/13/20 1940 04/14/20 0249  PROCALCITON  --   --   --  4.81  --   --   --  3.84  WBC  --  9.5  --   --  5.4 4.6  --  28.0*  LATICACIDVEN   < >  --  6.2*  --  7.1* 6.0* 6.0*  --    < > = values in this interval not displayed.    Liver Function Tests: Recent Labs  Lab 04/12/20 2349 04/13/20 0655  AST 46* 65*  ALT  26 28  ALKPHOS 178* 142*  BILITOT 0.6 0.5  PROT 4.8* 4.1*  ALBUMIN 1.3* 1.2*   No results for input(s): LIPASE, AMYLASE in the last 168 hours. Recent Labs  Lab 04/13/20 0043  AMMONIA 51*    ABG    Component Value Date/Time  PHART 7.365 04/13/2020 0832   PCO2ART 20.4 (L) 04/13/2020 0832   PO2ART 57 (L) 04/13/2020 0832   HCO3 11.7 (L) 04/13/2020 0832   TCO2 12 (L) 04/13/2020 0832   ACIDBASEDEF 12.0 (H) 04/13/2020 0832   O2SAT 89.0 04/13/2020 0832     Coagulation Profile: No results for input(s): INR, PROTIME in the last 168 hours.  Cardiac Enzymes: Recent Labs  Lab 04/13/20 0205  CKTOTAL 392*    HbA1C: No results found for: HGBA1C  CBG: Recent Labs  Lab 04/13/20 0756 04/13/20 0936 04/13/20 1214 04/13/20 1532 04/13/20 2309  GLUCAP 73 97 72 92 107*    Review of Systems:   + weakness, fatigue, incontinence  -SOB chest pain, palpitations, n/v/d abdominal pain, coughing wheezing URI sx, fever chills, HA LOC, changes to skin hair nails   Past Medical History  She,  has no past medical history on file.  Hypothyroidism  Surgical History     Social History      Family History   Her family history is not on file.   Allergies Not on File   Home Medications  Prior to Admission medications   Not on File     Critical care time:    CRITICAL CARE Performed by: Lanier Clam   Total critical care time: 40 minutes  Critical care time was exclusive of separately billable procedures and treating other patients.  Critical care was necessary to treat or prevent imminent or life-threatening deterioration.  Critical care was time spent personally by me on the following activities: development of treatment plan with patient and/or surrogate as well as nursing, discussions with consultants, evaluation of patient's response to treatment, examination of patient, obtaining history from patient or surrogate, ordering and performing treatments and  interventions, ordering and review of laboratory studies, ordering and review of radiographic studies, pulse oximetry and re-evaluation of patient's condition.

## 2020-04-14 NOTE — Progress Notes (Signed)
Lower extremity venous bilateral study completed.   Please see CV Proc for preliminary results.   Haelyn Forgey, RDMS  

## 2020-04-14 NOTE — Progress Notes (Signed)
[place] [facility] Progress Note Patient Name: Michelle Avila DOB: Sep 14, 1945 MRN: [id number]   Date of Service  04/14/2020  HPI/Events of Note  RN notifies me that patient hasn't voided this shift. She does, however, have 250-270cc in bladder on bladder scan. She is comfortable and not having any suprapubic discomfort.   eICU Interventions  Continue to monitor at this time. She is clearly making urine but not yet had a spontaneous void. Will consider straight cath if bladder scan > 350 or she develops suprapubic discomfort.     Intervention Category Minor Interventions: Routine modifications to care plan (e.g. PRN medications for pain, fever)  Marily Lente Nattie Lazenby 04/14/2020, 2:31 AM

## 2020-04-14 NOTE — Progress Notes (Signed)
CRITICAL VALUE ALERT  Critical Value:  Calcium 6.1 & K 3.0  Date & Time Notied:  04/14/20 0520  Provider Notified: e-link  Orders Received/Actions taken: none at this time

## 2020-04-14 NOTE — Progress Notes (Signed)
[facility] Progress Note Patient Name: Michelle Avila DOB: 12/17/1945 MRN: [id number]   Date of Service  04/14/2020  HPI/Events of Note  RN discussed about new change in mentation, more lethrgic, earlier used to follow commands.  Note, labs, meds reviewed. Camera eval done. VS stable.   Encephalopathy from AKI-dehydration, sepsis/elevated LA from GNB-colitis on ciproflox. UOP low-dark. Lungs clear.   CTH was neg. Vascular dopler. No DVT. Chronic peroneal thrombosis. Popliteal pulse +.   Low K. Acidosis earlier AM labs.   eICU Interventions  - get CMP/ABG/LA - start D5NS at 75 ml/hr. S/p fluid bolus for sepsis already.  - aspiration precautions. - POCT glucose q1 hr.      Intervention Category Intermediate Interventions: Change in mental status - evaluation and management  Elmer Sow 04/14/2020, 8:29 PM

## 2020-04-14 NOTE — Consult Note (Signed)
WOC Nurse Consult Note: Reason for Consult:unstageable pressure injuries to: Sacrum Soles of feet Bilateral lateral malleolus with partial thickness trauma lesions Scattered abrasions to arms and knees.   Wound type:trauma and pressure  Self neglect  Found at home unable to care for self.  Pressure Injury POA: Yes Measurement:Sacrum:  5 cm x 7 cm with slough to wound bed Soles of feet:  LInear abrasions with slough to wound bed.  Malleolus abrasions to both feet, 1 cm round.  Wound bed:all with devitalized tissue.  Drainage (amount, consistency, odor) minimal serosanguinous  No odor.  Periwound:bruising  Assessing PT/INR  Dressing procedure/placement/frequency: Cleanse wounds to sacrum and feet with NS and pat dry.  Apply Santyl to wound bed. Cover with NS moist gauze. Secure with dry gauze and dry dressing.  Change daily.  Will not follow at this time.  Please re-consult if needed.  Domenic Moras MSN, RN, FNP-BC CWON Wound, Ostomy, Continence Nurse Pager [phone]

## 2020-04-14 NOTE — Progress Notes (Signed)
[facility] Progress Note Patient Name: Michelle Avila DOB: 1946/05/05 MRN: [id number]   Date of Service  04/14/2020  HPI/Events of Note  K 3.5, improving AKI Hg < 7. Anemia. INR 1.6, platelet dropped to 33 from sepsis.  Encephalopathy. Elevated AST.  eICU Interventions  - get INR/PTT/fibrinogen. - hold lovenox. scd as VTE - 1 PRBC. Watch for bleeding. - Kcl 10 meq q1 hr x 2.   Discussed with RN. Follow Hg post PRBC.      Intervention Category Intermediate Interventions: Coagulopathy - evaluation and management;Other:  Elmer Sow 04/14/2020, 10:03 PM

## 2020-04-14 NOTE — Progress Notes (Signed)
CRITICAL VALUE ALERT  Critical Value:  hemoglobin 6.1  Date & Time Notied:  04/14/2020 2130  Provider Notified: E-Link  Orders Received/Actions taken: 1 unit blood transfusion.

## 2020-04-14 NOTE — TOC Initial Note (Signed)
Transition of Care [facility]) - Initial/Assessment Note    Patient Details  Name: Michelle Avila MRN: [id number] Date of Birth: [birth year]  [facility] CM/SW Contact:    Leeroy Cha, RN Phone Number: 04/14/2020, 8:33 AM  Clinical Narrative:                 74 yo F with hx hypothyroidism, presenting with AMS. Neighbors hadn't heard from pt x several days. Daughter came in from out of state and found pt on floor, in feces and very altered. EMS dispatched an gave pt D10 for finding of glucose 19.  In ED, pt felt to be dehydrated and has been given 2.5L IVF  CT H obtained, and is without acute changes. BCx and Ucx ordered but picture in ED more consistent with dehydration, hypoglycemia than infection and pt not started on abx.  Pt mentation largely improved in ED. She endorses recent med change -- is now on a diuretic (unknown which) for BLE edema.  o2 at 2l/min, iv ns at kvo, albumin 25%, iv neosynephrine, iv zosyn, kcl runs x4, nahco3 at 150cc/hr, k+=3.0, bun 41 creat 2.20 Ca+=6.1 Following for progression. Plan is top return to home lives alone, does have family in the area. Expected Discharge Plan: Home/Self Care Barriers to Discharge: Barriers Unresolved (comment)   Patient Goals and CMS Choice Patient states their goals for this hospitalization and ongoing recovery are:: to go back home CMS Medicare.gov Compare Post Acute Care list provided to:: Patient    Expected Discharge Plan and Services Expected Discharge Plan: Home/Self Care   Discharge Planning Services: CM Consult   Living arrangements for the past 2 months: Single Family Home                                      Prior Living Arrangements/Services Living arrangements for the past 2 months: Single Family Home Lives with:: Self Patient language and need for interpreter reviewed:: Yes Do you feel safe going back to the place where you live?: Yes      Need for Family Participation in  Patient Care: Yes (Comment) Care giver support system in place?: Yes (comment)   Criminal Activity/Legal Involvement Pertinent to Current Situation/Hospitalization: No - Comment as needed  Activities of Daily Living   ADL Screening (condition at time of admission) Is the patient deaf or have difficulty hearing?: Yes Does the patient have difficulty seeing, even when wearing glasses/contacts?: No Weakness of Legs: Both Weakness of Arms/Hands: Both  Permission Sought/Granted                  Emotional Assessment Appearance:: Appears stated age Attitude/Demeanor/Rapport: Engaged Affect (typically observed): Calm Orientation: : Oriented to Place, Oriented to Self, Oriented to  Time, Oriented to Situation Alcohol / Substance Use: Not Applicable Psych Involvement: No (comment)  Admission diagnosis:  Dehydration [E86.0] Hypokalemia [E87.6] Shock ([place]) [Y86.5] Metabolic acidosis [H84.6] Hypoglycemia [E16.2] Thrombocytopenia ([facility]) [D69.6] Renal insufficiency [N28.9] Alkaline phosphatase elevation [R74.8] Elevated lactic acid level [R79.89] Increased ammonia level [R79.89] Normochromic anemia [D64.9] Hypothermia, initial encounter [T68.XXXA] Septic shock ([place]) [A41.9, R65.21] Altered mental status, unspecified altered mental status type [R41.82] Patient Active Problem List   Diagnosis Date Noted  . Dehydration 04/13/2020  . Shock ([place]) 04/13/2020  . Septic shock ([place]) 04/13/2020  . Pressure injury of skin 04/13/2020   PCP:  Street, Sharon Mt, MD Pharmacy:   [id number] - [place], [place] - [address] AT [address] [place] [address] Phone: [phone] Fax: [phone]     Social Determinants of Health (SDOH) Interventions    Readmission Risk Interventions No flowsheet data found.

## 2020-04-14 NOTE — Progress Notes (Signed)
Pharmacy Antibiotic Note  Michelle Avila is a 74 y.o. female admitted on 04/12/2020 with sepsis of unknown origin and is currently on vasopressors. Patient has cellulitis and possible colitis, possible pancreatitis.    04/14/2020  Hypothermic/AF, Tm 97.6 WBC 4.6>>28  SCr 2.2, CrCl ~ 17 ml/min,  PCT 4.81> 3.84 On phenylephrine at 45 mcg/min BCx: FLAVOBACTERIUM ODORATUM- sensitivities pending   Plan: Cipro 400 mg IV q24 F/U renal function, culture results, clinical course  Height: 5\' 5"  (165.1 cm) Weight: 49.7 kg (109 lb 9.1 oz) IBW/kg (Calculated) : 57  Temp (24hrs), Avg:96.2 F (35.7 C), Min:92.8 F (33.8 C), Max:97.6 F (36.4 C)  Recent Labs  Lab 04/12/20 2319 04/12/20 2349 04/13/20 0044 04/13/20 0310 04/13/20 0655 04/13/20 1940 04/14/20 0249  WBC  --  9.5  --  5.4 4.6  --  28.0*  CREATININE  --  2.68*  --  2.58* 2.41*  --  2.20*  LATICACIDVEN 5.4*  --  6.2* 7.1* 6.0* 6.0*  --     Estimated Creatinine Clearance: 17.3 mL/min (A) (by C-G formula based on SCr of 2.2 mg/dL (H)).    Not on File  Antimicrobials this admission: 10/17 Zosyn >> 10/18 10/17 vancomycin >> 10/18 10/18 cipro> Dose adjustments this admission:  Microbiology results: 10/17 BCx2: GPC in clusters in 1/4 bottles>  staph species no mec A (deemed contaminant)  and GNR 3/4 bottles =  FLAVOBACTERIUM ODORATUM 10/17 UCx:  insig growth F 10/17 GI PCR: neg 10/17 MRSA PCR: neg 10/17 Resp PCR: COVID - FLU A/B: neg 10/18 c diff neg  Thank you for allowing pharmacy to be a part of this patient's care.  Eudelia Bunch, Pharm.D 04/14/2020 2:50 PM

## 2020-04-15 DIAGNOSIS — A419 Sepsis, unspecified organism: Secondary | ICD-10-CM | POA: Diagnosis not present

## 2020-04-15 DIAGNOSIS — R7881 Bacteremia: Secondary | ICD-10-CM

## 2020-04-15 DIAGNOSIS — E43 Unspecified severe protein-calorie malnutrition: Secondary | ICD-10-CM | POA: Diagnosis not present

## 2020-04-15 DIAGNOSIS — N179 Acute kidney failure, unspecified: Secondary | ICD-10-CM | POA: Diagnosis not present

## 2020-04-15 LAB — BASIC METABOLIC PANEL
Anion gap: 11 (ref 5–15)
BUN: 40 mg/dL — ABNORMAL HIGH (ref 8–23)
CO2: 24 mmol/L (ref 22–32)
Calcium: 6.2 mg/dL — CL (ref 8.9–10.3)
Chloride: 97 mmol/L — ABNORMAL LOW (ref 98–111)
Creatinine, Ser: 1.98 mg/dL — ABNORMAL HIGH (ref 0.44–1.00)
GFR, Estimated: 24 mL/min — ABNORMAL LOW (ref >60)
Glucose, Bld: 102 mg/dL — ABNORMAL HIGH (ref 70–99)
Potassium: 3.7 mmol/L (ref 3.5–5.1)
Sodium: 132 mmol/L — ABNORMAL LOW (ref 135–145)

## 2020-04-15 LAB — GLUCOSE, CAPILLARY
Glucose-Capillary: 100 mg/dL — ABNORMAL HIGH (ref 70–99)
Glucose-Capillary: 102 mg/dL — ABNORMAL HIGH (ref 70–99)
Glucose-Capillary: 105 mg/dL — ABNORMAL HIGH (ref 70–99)
Glucose-Capillary: 108 mg/dL — ABNORMAL HIGH (ref 70–99)
Glucose-Capillary: 85 mg/dL (ref 70–99)
Glucose-Capillary: 87 mg/dL (ref 70–99)
Glucose-Capillary: 90 mg/dL (ref 70–99)
Glucose-Capillary: 94 mg/dL (ref 70–99)
Glucose-Capillary: 94 mg/dL (ref 70–99)

## 2020-04-15 LAB — CBC
HCT: 25.2 % — ABNORMAL LOW (ref 36.0–46.0)
Hemoglobin: 8.2 g/dL — ABNORMAL LOW (ref 12.0–15.0)
MCH: 25.5 pg — ABNORMAL LOW (ref 26.0–34.0)
MCHC: 32.5 g/dL (ref 30.0–36.0)
MCV: 78.3 fL — ABNORMAL LOW (ref 80.0–100.0)
Platelets: 19 10*3/uL — CL (ref 150–400)
RBC: 3.22 MIL/uL — ABNORMAL LOW (ref 3.87–5.11)
RDW: 24.9 % — ABNORMAL HIGH (ref 11.5–15.5)
WBC: 29.9 10*3/uL — ABNORMAL HIGH (ref 4.0–10.5)
nRBC: 0 % (ref 0.0–0.2)

## 2020-04-15 LAB — CULTURE, BLOOD (ROUTINE X 2)
Special Requests: ADEQUATE
Special Requests: ADEQUATE

## 2020-04-15 LAB — BLOOD GAS, ARTERIAL
Acid-Base Excess: 0.7 mmol/L (ref 0.0–2.0)
Bicarbonate: 25.2 mmol/L (ref 20.0–28.0)
O2 Content: 10 L/min
O2 Saturation: 97.1 %
Patient temperature: 98.6
pCO2 arterial: 42.8 mmHg (ref 32.0–48.0)
pH, Arterial: 7.388 (ref 7.350–7.450)
pO2, Arterial: 107 mmHg (ref 83.0–108.0)

## 2020-04-15 LAB — T4, FREE: Free T4: 0.67 ng/dL (ref 0.61–1.12)

## 2020-04-15 LAB — FIBRINOGEN: Fibrinogen: 336 mg/dL (ref 210–475)

## 2020-04-15 LAB — ABO/RH: ABO/RH(D): O POS

## 2020-04-15 LAB — PROCALCITONIN: Procalcitonin: 2.86 ng/mL

## 2020-04-15 LAB — PLATELET COUNT: Platelets: 17 10*3/uL — CL (ref 150–400)

## 2020-04-15 MED ORDER — CALCIUM GLUCONATE-NACL 2-0.675 GM/100ML-% IV SOLN
2.0000 g | Freq: Once | INTRAVENOUS | Status: AC
  Administered 2020-04-15: 2000 mg via INTRAVENOUS
  Filled 2020-04-15: qty 100

## 2020-04-15 MED ORDER — FUROSEMIDE 10 MG/ML IJ SOLN
80.0000 mg | Freq: Once | INTRAMUSCULAR | Status: AC
  Administered 2020-04-15: 80 mg via INTRAVENOUS
  Filled 2020-04-15: qty 8

## 2020-04-15 MED ORDER — ORAL CARE MOUTH RINSE
15.0000 mL | Freq: Two times a day (BID) | OROMUCOSAL | Status: DC
  Administered 2020-04-15 – 2020-05-02 (×27): 15 mL via OROMUCOSAL

## 2020-04-15 NOTE — Progress Notes (Signed)
[facility] Progress Note Patient Name: TRIVIA HEFFELFINGER DOB: [birth year] MRN: [id number]   Date of Service  04/15/2020  HPI/Events of Note  Hg up appropriately. Plt down < 20. On nasal o2.  Admitted less than 4 days. HIT unlikely. Mostly from ongoing sepsis. 18 th fibrinogen level > 200    eICU Interventions  follow up platelet /fibrinogen in AM, transfuse if under 10 K or bleeding. Watch for DIC.         Intervention Category Intermediate Interventions: Thrombocytopenia - evaluation and management  Elmer Sow 04/15/2020, 5:55 AM

## 2020-04-15 NOTE — Progress Notes (Signed)
Pt aroused by sternal rub, speech is minimal and garbled. R foot is pale and pedal pulse is absent. Popliteal pulse present with doppler. E-link MD made ware of neuro and vascular changes.

## 2020-04-15 NOTE — Progress Notes (Signed)
CRITICAL VALUE ALERT  Critical Value:  plts 19  Date & Time Notied:  04/15/2020 0545  Provider Notified: E-link  Orders Received/Actions taken:

## 2020-04-15 NOTE — Progress Notes (Signed)
Patient combative/uncooperative, refuses to allow NT to check blood sugar, will attempt as patient allows. Will continue to monitor.

## 2020-04-15 NOTE — Progress Notes (Signed)
NAME:  Michelle Avila, MRN:  [id number], DOB:  28-Jul-1945, LOS: 2 ADMISSION DATE:  04/12/2020, CONSULTATION DATE:  04/13/20 REFERRING MD:  Roxanne Mins - EM, CHIEF COMPLAINT:  AMS  Brief History   74 yo F with AMS, hypoglycemia dehydration AKI   History of present illness   74 yo F with hx hypothyroidism, presenting with AMS. Neighbors hadn't heard from pt x several days. Daughter came in from out of state and found pt on floor, in feces and very altered. EMS dispatched an gave pt D10 for finding of glucose 19.  In ED, pt felt to be dehydrated and has been given 2.5L IVF  CT H obtained, and is without acute changes. BCx and Ucx ordered but picture in ED more consistent with dehydration, hypoglycemia than infection and pt not started on abx.  Pt mentation largely improved in ED. She endorses recent med change -- is now on a diuretic (unknown which) for BLE edema.   Labs significant for Na 134 K 3.2 CO2 12 Glu 159 BUN 46 Cr 2.68  LA 5.4 WBC 9.5   Past Medical History  Hypothyroid  Significant Hospital Events   Desat 10/19 AM    Consults:    Procedures:    Significant Diagnostic Tests:  CT H 10/17> chronic microvascular ischemic changes, mild atrophy related to age.  Micro Data:  10/17 SARS Cov2> neg 10/17 BCx> 1 of 2 staph species, contaminant, 2 of 2 GNRs 10/17 UCx> <10k CFU 10/17: MRSA screen negative Antimicrobials:  10/17 zosyn >10/18 10/18 cipro (GNR resistant to all but cipro, imipenem)>>   Interim history/subjective:  Mental status stable, delirious at times. Oxygen worsened this morning. CXR and ABG ordered, suspect volume overload given her exam with IVF in setting of shock as well as severe hypoalbuminemia.  Objective   Blood pressure (!) 143/69, pulse 89, temperature 97.6 F (36.4 C), temperature source Oral, resp. rate 18, height 5\' 5"  (1.651 m), weight 58 kg, SpO2 (!) 83 %.        Intake/Output Summary (Last 24 hours) at 04/15/2020 1038 Last data filed at  04/15/2020 0825 Gross per 24 hour  Intake 3102.93 ml  Output 300 ml  Net 2802.93 ml   Filed Weights   04/13/20 0638 04/14/20 0500 04/15/20 0500  Weight: 50.8 kg 49.7 kg 58 kg    Examination: General: Thin frail older adult F, reclined in bed NAD HENT: NCAT Exophthalmus pink dry mm trachea midline Lungs: CTAb symmetrical chest expansion no accessory use on 3L [place] Cardiovascular: RRR s1s2 no rgm cap refill brisk Abdomen: soft flat ndnt + bowel sounds Extremities: significant pitting BLUE edema. No obvious joint deformity no cyanosis or clubbing, erythema of calves, forearms, blistering bullae of R thigh Neuro: AAOx3 following commands PERRL  Resolved Hospital Problem list     Assessment & Plan:   Acute encephalopathy: multifactorial including hypoglycemia, dehydration, sepsis from presumed colitis and bacteremia.  -CTH without acute changes P -continue correction of metabolic abnormalities - delirium precautions   Septic shock: source of sepsis colitis with GNR bacteremia. hypovolemic shock: resolved. P -cipro -off pressors  AKI: suspected-- unknown baseline. Improving with IVF. Appears pretty overloaded, likely would benefit from lasix.  P CTM, consider lasix  Lactic acidosis:resolved P -cont supportive care   Volume overload: suspected due to hypoalbuminemia, possible liver dysfunction. P -Lasix 80 mg IV ordered, monitor UOP  Thrombocytopenia: Due to septic shock P CTM, ppx lovenox d/c'd  Severe Protein calorie malnutrition: Appears to be wasting on  exam. 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 - no role for anticoagulation at this time and thrombocytopenia prohibits this  Hypothyroidism:  TSH over 9,  -resume home synthroid -FT4 ordered  Best practice:  Diet: Regular  Pain/Anxiety/Delirium protocol (if indicated): na VAP protocol (if indicated): na DVT prophylaxis: lovenox  GI prophylaxis: na Glucose control: monitor  Mobility: OOB to chair Code Status: Full  Family Communication: discussed with patient Disposition: ICU  Labs   CBC: Recent Labs  Lab 04/12/20 2349 04/12/20 2349 04/13/20 0310 04/13/20 0352 04/13/20 0626 04/13/20 9485 04/14/20 0249 04/14/20 2057 04/15/20 0529 04/15/20 0728  WBC 9.5   < > 5.4  --  4.6  --  28.0* 27.4* 29.9*  --   NEUTROABS 8.3*  --   --   --   --   --   --  26.0*  --   --   HGB 9.4*   < > 7.6*   < > 7.8* 8.2* 8.1* 6.1* 8.2*  --   HCT 33.3*   < > 26.1*   < > 26.6* 24.0* 25.9* 18.8* 25.2*  --   MCV 80.4   < > 79.6*  --  78.9*  --  77.1* 75.2* 78.3*  --   PLT 137*   < > PLATELET CLUMPS NOTED ON SMEAR, UNABLE TO ESTIMATE   < > 120*  --  63* 33* 19* 17*   < > = values in this interval not displayed.    Basic Metabolic Panel: Recent Labs  Lab 04/12/20 2349 04/12/20 2349 04/13/20 0310 04/13/20 0352 04/13/20 4627 04/13/20 0350 04/14/20 0249 04/14/20 2057 04/15/20 0529  NA 134*  --   --    < > 135 137 135 135 132*  K 3.2*  --   --    < > 3.0* 3.0* 3.0* 3.5 3.7  CL 106  --   --   --  108  --  98 96* 97*  CO2 12*  --   --   --  11*  --  19* 24 24  GLUCOSE 159*  --   --   --  72  --  97 76 102*  BUN 46*  --   --   --  45*  --  41* 42* 40*  CREATININE 2.68*   < > 2.58*  --  2.41*  --  2.20* 1.98* 1.98*  CALCIUM 7.5*  --   --   --  6.9*  --  6.1* 6.5* 6.2*  MG  --   --   --   --  1.6*  --   --   --   --   PHOS  --   --   --   --  5.4*  --   --   --   --    < > = values in this interval not displayed.   GFR: Estimated Creatinine Clearance: 22.1 mL/min (A) (by C-G formula based on SCr of 1.98 mg/dL (H)). Recent Labs  Lab 04/13/20 0150 04/13/20 0310 04/13/20 0938 04/13/20 1940 04/14/20 0249 04/14/20 2057  04/14/20 2316 04/15/20 0529  PROCALCITON 4.81  --   --   --  3.84  --   --  2.86  WBC  --    < > 4.6  --  28.0* 27.4*  --  29.9*  LATICACIDVEN  --    < > 6.0* 6.0*  --  2.0* 1.6  --    < > = values in this interval not displayed.    Liver Function Tests: Recent Labs  Lab 04/12/20 2349 04/13/20 0655 04/14/20 2057  AST 46* 65* 145*  ALT 26 28 49*  ALKPHOS 178* 142* 126  BILITOT 0.6 0.5 1.2  PROT 4.8* 4.1* 5.0*  ALBUMIN 1.3* 1.2* 3.0*   No results for input(s): LIPASE, AMYLASE in the last 168 hours. Recent Labs  Lab 04/13/20 0043  AMMONIA 51*    ABG    Component Value Date/Time   PHART 7.448 04/14/2020 2026   PCO2ART 38.0 04/14/2020 2026   PO2ART 72.0 (L) 04/14/2020 2026   HCO3 25.9 04/14/2020 2026   TCO2 12 (L) 04/13/2020 0832   ACIDBASEDEF 12.0 (H) 04/13/2020 0832   O2SAT 92.4 04/14/2020 2026     Coagulation Profile: Recent Labs  Lab 04/14/20 0845 04/14/20 2316  INR 1.6* 1.6*    Cardiac Enzymes: Recent Labs  Lab 04/13/20 0205  CKTOTAL 392*    HbA1C: No results found for: HGBA1C  CBG: Recent Labs  Lab 04/15/20 0148 04/15/20 0254 04/15/20 0359 04/15/20 0537 04/15/20 0817  GLUCAP 90 100* 94 108* 102*    Review of Systems:   + weakness, fatigue, incontinence  -SOB chest pain, palpitations, n/v/d abdominal pain, coughing wheezing URI sx, fever chills, HA LOC, changes to skin hair nails   Past Medical History  She,  has no past medical history on file.  Hypothyroidism  Surgical History     Social History      Family History   Her family history is not on file.   Allergies Not on File   Home Medications  Prior to Admission medications   Not on File     Critical care time:    CRITICAL CARE Performed by: Lanier Clam   Total critical care time: 45 minutes  Critical care time was exclusive of separately billable procedures and treating other patients.  Critical care was necessary to treat or prevent imminent or  life-threatening deterioration.  Critical care was time spent personally by me on the following activities: development of treatment plan with patient and/or surrogate as well as nursing, discussions with consultants, evaluation of patient's response to treatment, examination of patient, obtaining history from patient or surrogate, ordering and performing treatments and interventions, ordering and review of laboratory studies, ordering and review of radiographic studies, pulse oximetry and re-evaluation of patient's condition.

## 2020-04-16 ENCOUNTER — Inpatient Hospital Stay: Payer: Self-pay

## 2020-04-16 ENCOUNTER — Inpatient Hospital Stay (HOSPITAL_COMMUNITY): Payer: Medicare Other

## 2020-04-16 ENCOUNTER — Encounter (HOSPITAL_COMMUNITY): Payer: Self-pay | Admitting: Pulmonary Disease

## 2020-04-16 DIAGNOSIS — N179 Acute kidney failure, unspecified: Secondary | ICD-10-CM | POA: Diagnosis not present

## 2020-04-16 DIAGNOSIS — A419 Sepsis, unspecified organism: Secondary | ICD-10-CM | POA: Diagnosis not present

## 2020-04-16 DIAGNOSIS — E43 Unspecified severe protein-calorie malnutrition: Secondary | ICD-10-CM | POA: Diagnosis not present

## 2020-04-16 DIAGNOSIS — E860 Dehydration: Secondary | ICD-10-CM | POA: Diagnosis not present

## 2020-04-16 LAB — CBC WITH DIFFERENTIAL/PLATELET
Abs Immature Granulocytes: 0.26 10*3/uL — ABNORMAL HIGH (ref 0.00–0.07)
Basophils Absolute: 0.1 10*3/uL (ref 0.0–0.1)
Basophils Relative: 0 %
Eosinophils Absolute: 0.1 10*3/uL (ref 0.0–0.5)
Eosinophils Relative: 0 %
HCT: 27.2 % — ABNORMAL LOW (ref 36.0–46.0)
Hemoglobin: 8.8 g/dL — ABNORMAL LOW (ref 12.0–15.0)
Immature Granulocytes: 1 %
Lymphocytes Relative: 2 %
Lymphs Abs: 0.4 10*3/uL — ABNORMAL LOW (ref 0.7–4.0)
MCH: 25.4 pg — ABNORMAL LOW (ref 26.0–34.0)
MCHC: 32.4 g/dL (ref 30.0–36.0)
MCV: 78.4 fL — ABNORMAL LOW (ref 80.0–100.0)
Monocytes Absolute: 1.4 10*3/uL — ABNORMAL HIGH (ref 0.1–1.0)
Monocytes Relative: 6 %
Neutro Abs: 22 10*3/uL — ABNORMAL HIGH (ref 1.7–7.7)
Neutrophils Relative %: 91 %
Platelets: 30 10*3/uL — ABNORMAL LOW (ref 150–400)
RBC: 3.47 MIL/uL — ABNORMAL LOW (ref 3.87–5.11)
RDW: 24.9 % — ABNORMAL HIGH (ref 11.5–15.5)
WBC: 24.2 10*3/uL — ABNORMAL HIGH (ref 4.0–10.5)
nRBC: 0 % (ref 0.0–0.2)

## 2020-04-16 LAB — BASIC METABOLIC PANEL
Anion gap: 13 (ref 5–15)
BUN: 42 mg/dL — ABNORMAL HIGH (ref 8–23)
CO2: 22 mmol/L (ref 22–32)
Calcium: 6.7 mg/dL — ABNORMAL LOW (ref 8.9–10.3)
Chloride: 100 mmol/L (ref 98–111)
Creatinine, Ser: 1.96 mg/dL — ABNORMAL HIGH (ref 0.44–1.00)
GFR, Estimated: 24 mL/min — ABNORMAL LOW (ref >60)
Glucose, Bld: 81 mg/dL (ref 70–99)
Potassium: 3.4 mmol/L — ABNORMAL LOW (ref 3.5–5.1)
Sodium: 135 mmol/L (ref 135–145)

## 2020-04-16 LAB — CBC
HCT: 28.1 % — ABNORMAL LOW (ref 36.0–46.0)
Hemoglobin: 9 g/dL — ABNORMAL LOW (ref 12.0–15.0)
MCH: 25.7 pg — ABNORMAL LOW (ref 26.0–34.0)
MCHC: 32 g/dL (ref 30.0–36.0)
MCV: 80.3 fL (ref 80.0–100.0)
Platelets: 5 10*3/uL — CL (ref 150–400)
RBC: 3.5 MIL/uL — ABNORMAL LOW (ref 3.87–5.11)
RDW: 25.2 % — ABNORMAL HIGH (ref 11.5–15.5)
WBC: 27.9 10*3/uL — ABNORMAL HIGH (ref 4.0–10.5)
nRBC: 0 % (ref 0.0–0.2)

## 2020-04-16 LAB — TYPE AND SCREEN
ABO/RH(D): O POS
Antibody Screen: NEGATIVE
Unit division: 0

## 2020-04-16 LAB — BPAM RBC
Blood Product Expiration Date: 202111182359
ISSUE DATE / TIME: 202110190128
Unit Type and Rh: 5100

## 2020-04-16 LAB — GLUCOSE, CAPILLARY
Glucose-Capillary: 62 mg/dL — ABNORMAL LOW (ref 70–99)
Glucose-Capillary: 145 mg/dL — ABNORMAL HIGH (ref 70–99)
Glucose-Capillary: 95 mg/dL (ref 70–99)
Glucose-Capillary: 81 mg/dL (ref 70–99)
Glucose-Capillary: 55 mg/dL — ABNORMAL LOW (ref 70–99)
Glucose-Capillary: 106 mg/dL — ABNORMAL HIGH (ref 70–99)
Glucose-Capillary: 102 mg/dL — ABNORMAL HIGH (ref 70–99)
Glucose-Capillary: 86 mg/dL (ref 70–99)

## 2020-04-16 MED ORDER — DEXTROSE 50 % IV SOLN: 12.5000 g | INTRAVENOUS | Status: AC

## 2020-04-16 MED ORDER — DEXTROSE 50 % IV SOLN
12.5000 g | INTRAVENOUS | Status: AC
  Administered 2020-04-16: 12.5 g via INTRAVENOUS
  Filled 2020-04-16: qty 50

## 2020-04-16 MED ORDER — FUROSEMIDE 10 MG/ML IJ SOLN
80.0000 mg | Freq: Once | INTRAMUSCULAR | Status: AC
  Administered 2020-04-16: 80 mg via INTRAVENOUS
  Filled 2020-04-16: qty 8

## 2020-04-16 MED ORDER — DEXTROSE 50 % IV SOLN
INTRAVENOUS | Status: AC
  Administered 2020-04-16: 25 mL
  Filled 2020-04-16: qty 50

## 2020-04-16 MED ORDER — CALCIUM GLUCONATE-NACL 2-0.675 GM/100ML-% IV SOLN
2.0000 g | Freq: Once | INTRAVENOUS | Status: AC
  Administered 2020-04-16: 2000 mg via INTRAVENOUS
  Filled 2020-04-16: qty 100

## 2020-04-16 MED ORDER — SODIUM CHLORIDE 0.9% IV SOLUTION: Freq: Once | INTRAVENOUS | Status: AC

## 2020-04-16 MED ORDER — POTASSIUM CHLORIDE 10 MEQ/100ML IV SOLN
10.0000 meq | INTRAVENOUS | Status: AC
  Administered 2020-04-16 (×2): 10 meq via INTRAVENOUS
  Filled 2020-04-16 (×2): qty 100

## 2020-04-16 NOTE — Progress Notes (Signed)
[facility] Progress Note Patient Name: Michelle Avila DOB: 05/12/1946 MRN: [id number]   Date of Service  04/16/2020  HPI/Events of Note  Thrombocytopenia - Platelet count = 5.   eICU Interventions  Plan: 1. Transfuse 1 unit single donor platelets now.  2. Repeat CBC with platelets at 12 noon.      Intervention Category Major Interventions: Other:  [name] [name] 04/16/2020, 4:12 AM

## 2020-04-16 NOTE — Progress Notes (Addendum)
CRITICAL VALUE ALERT  Critical Value:  plts 6  Date & Time Notied:  04/16/2020 0352  Provider Notified: [facility]  Orders Received/Actions taken: 1 units of platelets and repeat CBC.

## 2020-04-16 NOTE — Significant Event (Signed)
Patient with AKI due to ATN from hypotension, hypovolemia. Cr improving. Diuresing to remove volume. Anticipate ongoing renal recovery. Do not think she is a good candidate for dialysis if kidneys were to worsen in the future.. I am ok with PICC placement given poor access and likely need for length hospital stay.

## 2020-04-16 NOTE — Progress Notes (Signed)
NAME:  Michelle Avila, MRN:  [id number], DOB:  [birth year], LOS: 3 ADMISSION DATE:  04/12/2020, CONSULTATION DATE:  04/13/20 REFERRING MD:  Roxanne Mins - EM, CHIEF COMPLAINT:  AMS  Brief History   74 yo F with AMS, hypoglycemia dehydration AKI   History of present illness   74 yo F with hx hypothyroidism, presenting with AMS. Neighbors hadn't heard from pt x several days. Daughter came in from out of state and found pt on floor, in feces and very altered. EMS dispatched an gave pt D10 for finding of glucose 19.  In ED, pt felt to be dehydrated and has been given 2.5L IVF  CT H obtained, and is without acute changes. BCx and Ucx ordered but picture in ED more consistent with dehydration, hypoglycemia than infection and pt not started on abx.  Pt mentation largely improved in ED. She endorses recent med change -- is now on a diuretic (unknown which) for BLE edema.   Labs significant for Na 134 K 3.2 CO2 12 Glu 159 BUN 46 Cr 2.68  LA 5.4 WBC 9.5   Past Medical History  Hypothyroid  Significant Hospital Events   [place] 10/19 AM    Consults:    Procedures:    Significant Diagnostic Tests:  CT H 10/17> chronic microvascular ischemic changes, mild atrophy related to age.  Micro Data:  10/17 SARS Cov2> neg 10/17 BCx> 1 of 2 staph species, contaminant, 2 of 2 GNRs 10/17 UCx> <10k CFU 10/17: MRSA screen negative Antimicrobials:  10/17 zosyn >10/18 10/18 cipro (GNR resistant to all but cipro, imipenem)>>   Interim history/subjective:  Mental status stable, delirious at times. Oxygen imrpoving. Got dose of lasix, I/O not accurate.   Objective   Blood pressure (!) 162/72, pulse 70, temperature (!) 94.5 F (34.7 C), temperature source Rectal, resp. rate 12, height 5\' 5"  (1.651 m), weight 57.5 kg, SpO2 94 %.        Intake/Output Summary (Last 24 hours) at 04/16/2020 0902 Last data filed at 04/15/2020 1900 Gross per 24 hour  Intake 331.42 ml  Output 250 ml  Net 81.42 ml    Filed Weights   04/14/20 0500 04/15/20 0500 04/16/20 0349  Weight: 49.7 kg 58 kg 57.5 kg    Examination: General: Thin frail older adult F, reclined in bed NAD HENT: NCAT Exophthalmus pink dry mm trachea midline Lungs: CTAb symmetrical chest expansion no accessory use on 3L [place] Cardiovascular: RRR s1s2 no rgm cap refill brisk Abdomen: soft flat ndnt + bowel sounds Extremities: significant pitting BLUE edema. No obvious joint deformity no cyanosis or clubbing, erythema of calves, forearms, blistering bullae of R thigh Neuro: AAOx3 following commands PERRL  Resolved Hospital Problem list     Assessment & Plan:   Acute encephalopathy: multifactorial including hypoglycemia, dehydration, sepsis from presumed colitis and bacteremia. CTH without acute changes. Intermittent delirium. P - delirium precautions   Septic shock: source of sepsis colitis with GNR bacteremia. hypovolemic shock: resolved. P -cipro, plan 14 days  AKI: suspected-- unknown baseline. Improving with IVF. Appears pretty overloaded, getting lasix now that off pressors.  P Lasix re-ordered this AM  Lactic acidosis:resolved P -cont supportive care   Volume overload: suspected due to hypoalbuminemia, possible liver dysfunction. P -Lasix 80 mg IV ordered, monitor UOP  Thrombocytopenia: Due to septic shock. S/p 1u pltsAM 10/20. No acute clot on dopplers, low suspicion for HIT given timing. P CTM, ppx lovenox d/c'd  Severe Protein calorie malnutrition: Appears to be wasting on exam.  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 - no role for anticoagulation at this time and thrombocytopenia prohibits this  Hypothyroidism: TSH over 9,  -resume  home synthroid -FT4 ordered  Best practice:  Diet: Regular  Pain/Anxiety/Delirium protocol (if indicated): na VAP protocol (if indicated): na DVT prophylaxis: lovenox  GI prophylaxis: na Glucose control: monitor  Mobility: OOB to chair Code Status: Full  Family Communication: discussed with patient and daughter Disposition: Transfer out of icu  Labs   CBC: Recent Labs  Lab 04/12/20 2349 04/13/20 0310 04/13/20 9485 04/13/20 4627 04/13/20 0350 04/14/20 0249 04/14/20 2057 04/15/20 0529 04/15/20 0728 04/16/20 0310  WBC 9.5   < > 4.6  --   --  28.0* 27.4* 29.9*  --  27.9*  NEUTROABS 8.3*  --   --   --   --   --  26.0*  --   --   --   HGB 9.4*   < > 7.8*   < > 8.2* 8.1* 6.1* 8.2*  --  9.0*  HCT 33.3*   < > 26.6*   < > 24.0* 25.9* 18.8* 25.2*  --  28.1*  MCV 80.4   < > 78.9*  --   --  77.1* 75.2* 78.3*  --  80.3  PLT 137*   < > 120*   < >  --  63* 33* 19* 17* 5*   < > = values in this interval not displayed.    Basic Metabolic Panel: Recent Labs  Lab 04/13/20 0655 04/13/20 0655 04/13/20 0832 04/14/20 0249 04/14/20 2057 04/15/20 0529 04/16/20 0310  NA 135   < > 137 135 135 132* 135  K 3.0*   < > 3.0* 3.0* 3.5 3.7 3.4*  CL 108  --   --  98 96* 97* 100  CO2 11*  --   --  19* 24 24 22   GLUCOSE 72  --   --  97 76 102* 81  BUN 45*  --   --  41* 42* 40* 42*  CREATININE 2.41*  --   --  2.20* 1.98* 1.98* 1.96*  CALCIUM 6.9*  --   --  6.1* 6.5* 6.2* 6.7*  MG 1.6*  --   --   --   --   --   --   PHOS 5.4*  --   --   --   --   --   --    < > = values in this interval not displayed.   GFR: Estimated Creatinine Clearance: 22.3 mL/min (A) (by C-G formula based on SCr of 1.96 mg/dL (H)). Recent Labs  Lab 04/13/20 0150 04/13/20 0310 04/13/20 0655 04/13/20 0655 04/13/20 1940 04/14/20 0249 04/14/20 2057 04/14/20 2316 04/15/20 0529 04/16/20 0310  PROCALCITON 4.81  --   --   --   --  3.84  --   --  2.86  --   WBC  --    < > 4.6   < >  --  28.0* 27.4*  --  29.9* 27.9*   LATICACIDVEN  --    < > 6.0*  --  6.0*  --  2.0* 1.6  --   --    < > =  values in this interval not displayed.    Liver Function Tests: Recent Labs  Lab 04/12/20 2349 04/13/20 0655 04/14/20 2057  AST 46* 65* 145*  ALT 26 28 49*  ALKPHOS 178* 142* 126  BILITOT 0.6 0.5 1.2  PROT 4.8* 4.1* 5.0*  ALBUMIN 1.3* 1.2* 3.0*   No results for input(s): LIPASE, AMYLASE in the last 168 hours. Recent Labs  Lab 04/13/20 0043  AMMONIA 51*    ABG    Component Value Date/Time   PHART 7.388 04/15/2020 1035   PCO2ART 42.8 04/15/2020 1035   PO2ART 107 04/15/2020 1035   HCO3 25.2 04/15/2020 1035   TCO2 12 (L) 04/13/2020 0832   ACIDBASEDEF 12.0 (H) 04/13/2020 0832   O2SAT 97.1 04/15/2020 1035     Coagulation Profile: Recent Labs  Lab 04/14/20 0845 04/14/20 2316  INR 1.6* 1.6*    Cardiac Enzymes: Recent Labs  Lab 04/13/20 0205  CKTOTAL 392*    HbA1C: No results found for: HGBA1C  CBG: Recent Labs  Lab 04/15/20 2001 04/15/20 2343 04/16/20 0325 04/16/20 0403 04/16/20 0840  GLUCAP 94 87 62* 145* 95    Review of Systems:   + weakness, fatigue, incontinence  -SOB chest pain, palpitations, n/v/d abdominal pain, coughing wheezing URI sx, fever chills, HA LOC, changes to skin hair nails   Past Medical History  She,  has no past medical history on file.  Hypothyroidism  Surgical History     Social History      Family History   Her family history is not on file.   Allergies Not on File   Home Medications  Prior to Admission medications   Not on File     Critical care time: n/a

## 2020-04-16 NOTE — TOC Progression Note (Signed)
Transition of Care [facility]) - Progression Note    Patient Details  Name: Michelle Avila MRN: [id number] Date of Birth: Sep 15, 1945  [facility] of [facility]) CM/SW Contact  Leeroy Cha, RN Phone Number: 04/16/2020, 8:59 AM  Clinical Narrative:    74 yo F with hx hypothyroidism, presenting with AMS. Neighbors hadn't heard from pt x several days. Daughter came in from out of state and found pt on floor, in feces and very altered. EMS dispatched an gave pt D10 for finding of glucose 19.  In ED, pt felt to be dehydrated and has been given 2.5L IVF  CT H obtained, and is without acute changes. BCx and Ucx ordered but picture in ED more consistent with dehydration, hypoglycemia than infection and pt not started on abx.  Pt mentation largely improved in ED. She endorses recent med change -- is now on a diuretic (unknown which) for BLE edema.   Labs significant for Na 134 K 3.2 CO2 12 Glu 159 BUN 46 Cr 2.68  LA 5.4 WBC 9.5   Past Medical History  Hypothyroid  Significant Hospital Events   [place] 10/19 AM   102021/o2 via [place] at 4l/min, iv lasix 80mg  iv, iv cipro, kcl runs iv x2. Patient is confused and combative this am. Temp 97.5   Expected Discharge Plan: Home/Self Care Barriers to Discharge: Barriers Unresolved (comment)  Expected Discharge Plan and Services Expected Discharge Plan: Home/Self Care   Discharge Planning Services: CM Consult   Living arrangements for the past 2 months: Single Family Home                                       Social Determinants of Health (SDOH) Interventions    Readmission Risk Interventions No flowsheet data found.

## 2020-04-16 NOTE — Progress Notes (Signed)
Peripherally Inserted Central Catheter Placement  The IV Nurse has discussed with the patient and/or persons authorized to consent for the patient, the purpose of this procedure and the potential benefits and risks involved with this procedure.  The benefits include less needle sticks, lab draws from the catheter, and the patient may be discharged home with the catheter. Risks include, but not limited to, infection, bleeding, blood clot (thrombus formation), and puncture of an artery; nerve damage and irregular heartbeat and possibility to perform a PICC exchange if needed/ordered by physician.  Alternatives to this procedure were also discussed.  Bard Power PICC patient education guide, fact sheet on infection prevention and patient information card has been provided to patient /or left at bedside.  Telephone consent obtained from daughter due to altered mental status.  PICC Placement Documentation  PICC Double Lumen 36/64/40 PICC Right Basilic 37 cm 1 cm (Active)  Indication for Insertion or Continuance of Line Limited venous access - need for IV therapy >5 days (PICC only);Poor Vasculature-patient has had multiple peripheral attempts or PIVs lasting less than 24 hours 04/16/20 1533  Exposed Catheter (cm) 1 cm 04/16/20 1533  Site Assessment Clean;Dry;Intact 04/16/20 1533  Lumen #1 Status Flushed;Saline locked;Blood return noted 04/16/20 1533  Lumen #2 Status Flushed;Saline locked;Blood return noted 04/16/20 1533  Dressing Type Transparent 04/16/20 1533  Dressing Status Clean;Dry;Intact 04/16/20 1533  Antimicrobial disc in place? Yes 04/16/20 1533  Safety Lock Not Applicable 34/74/25 9563  Line Care Connections checked and tightened 04/16/20 1533  Line Adjustment (NICU/IV Team Only) No 04/16/20 1533  Dressing Intervention New dressing 04/16/20 1533  Dressing Change Due 04/23/20 04/16/20 [employer], Nicolette Bang 04/16/2020, 3:36 PM

## 2020-04-17 DIAGNOSIS — G928 Other toxic encephalopathy: Secondary | ICD-10-CM

## 2020-04-17 DIAGNOSIS — E43 Unspecified severe protein-calorie malnutrition: Secondary | ICD-10-CM | POA: Diagnosis not present

## 2020-04-17 DIAGNOSIS — D696 Thrombocytopenia, unspecified: Secondary | ICD-10-CM

## 2020-04-17 DIAGNOSIS — E860 Dehydration: Secondary | ICD-10-CM | POA: Diagnosis not present

## 2020-04-17 DIAGNOSIS — A419 Sepsis, unspecified organism: Secondary | ICD-10-CM | POA: Diagnosis not present

## 2020-04-17 DIAGNOSIS — R471 Dysarthria and anarthria: Secondary | ICD-10-CM

## 2020-04-17 DIAGNOSIS — N179 Acute kidney failure, unspecified: Secondary | ICD-10-CM | POA: Diagnosis not present

## 2020-04-17 LAB — GLUCOSE, CAPILLARY
Glucose-Capillary: 119 mg/dL — ABNORMAL HIGH (ref 70–99)
Glucose-Capillary: 65 mg/dL — ABNORMAL LOW (ref 70–99)
Glucose-Capillary: 74 mg/dL (ref 70–99)
Glucose-Capillary: 82 mg/dL (ref 70–99)
Glucose-Capillary: 92 mg/dL (ref 70–99)
Glucose-Capillary: 92 mg/dL (ref 70–99)
Glucose-Capillary: 95 mg/dL (ref 70–99)

## 2020-04-17 LAB — CBC
HCT: 28.6 % — ABNORMAL LOW (ref 36.0–46.0)
Hemoglobin: 9 g/dL — ABNORMAL LOW (ref 12.0–15.0)
MCH: 25.4 pg — ABNORMAL LOW (ref 26.0–34.0)
MCHC: 31.5 g/dL (ref 30.0–36.0)
MCV: 80.6 fL (ref 80.0–100.0)
Platelets: 11 10*3/uL — CL (ref 150–400)
RBC: 3.55 MIL/uL — ABNORMAL LOW (ref 3.87–5.11)
RDW: 25.3 % — ABNORMAL HIGH (ref 11.5–15.5)
WBC: 17.3 10*3/uL — ABNORMAL HIGH (ref 4.0–10.5)
nRBC: 0 % (ref 0.0–0.2)

## 2020-04-17 LAB — BPAM PLATELET PHERESIS
Blood Product Expiration Date: 202110222359
ISSUE DATE / TIME: 202110200551
Unit Type and Rh: 5100

## 2020-04-17 LAB — BASIC METABOLIC PANEL
Anion gap: 12 (ref 5–15)
BUN: 44 mg/dL — ABNORMAL HIGH (ref 8–23)
CO2: 25 mmol/L (ref 22–32)
Calcium: 7.3 mg/dL — ABNORMAL LOW (ref 8.9–10.3)
Chloride: 99 mmol/L (ref 98–111)
Creatinine, Ser: 1.99 mg/dL — ABNORMAL HIGH (ref 0.44–1.00)
GFR, Estimated: 24 mL/min — ABNORMAL LOW (ref >60)
Glucose, Bld: 90 mg/dL (ref 70–99)
Potassium: 3.4 mmol/L — ABNORMAL LOW (ref 3.5–5.1)
Sodium: 136 mmol/L (ref 135–145)

## 2020-04-17 LAB — PREPARE PLATELET PHERESIS: Unit division: 0

## 2020-04-17 MED ORDER — CEFTRIAXONE SODIUM 1 G IJ SOLR
1.0000 g | INTRAMUSCULAR | Status: DC
  Administered 2020-04-17: 1 g via INTRAMUSCULAR
  Filled 2020-04-17 (×2): qty 10

## 2020-04-17 MED ORDER — DEXTROSE 50 % IV SOLN
INTRAVENOUS | Status: AC
  Administered 2020-04-17: 25 mL via INTRAVENOUS
  Filled 2020-04-17: qty 50

## 2020-04-17 MED ORDER — SODIUM CHLORIDE 0.9% FLUSH
10.0000 mL | INTRAVENOUS | Status: DC | PRN
  Administered 2020-04-18: 10 mL

## 2020-04-17 MED ORDER — DEXTROSE IN LACTATED RINGERS 5 % IV SOLN: INTRAVENOUS | Status: DC

## 2020-04-17 MED ORDER — SODIUM CHLORIDE 0.9% FLUSH
10.0000 mL | Freq: Two times a day (BID) | INTRAVENOUS | Status: DC
  Administered 2020-04-17 – 2020-05-02 (×18): 10 mL

## 2020-04-17 MED ORDER — LEVOFLOXACIN IN D5W 500 MG/100ML IV SOLN
500.0000 mg | INTRAVENOUS | Status: DC
  Administered 2020-04-17: 500 mg via INTRAVENOUS
  Filled 2020-04-17: qty 100

## 2020-04-17 MED ORDER — SODIUM CHLORIDE 0.9% IV SOLUTION: Freq: Once | INTRAVENOUS | Status: AC

## 2020-04-17 NOTE — Progress Notes (Addendum)
[place] Progress Note Patient Name: XENA PROPST DOB: 1946/05/29 MRN: [id number]   Date of Service  04/17/2020  HPI/Events of Note  Thrombocytopenia , 11 K.  eICU Interventions  Transfuse a unit of platelets. Switch Levofloxacin to Ceftriaxone in case it is causing thrombocytopenia, HIT panel.        Kerry Kass Ranbir Chew 04/17/2020, 9:35 PM

## 2020-04-17 NOTE — Progress Notes (Signed)
NAME:  Michelle Avila, MRN:  [id number], DOB:  27-Apr-1946, LOS: 4 ADMISSION DATE:  04/12/2020, CONSULTATION DATE:  04/13/20 REFERRING MD:  Michelle Avila - EM, CHIEF COMPLAINT:  AMS  Brief History   74 yo F with AMS, hypoglycemia dehydration AKI   History of present illness   74 yo F with hx hypothyroidism, presenting with AMS. Neighbors hadn't heard from pt x several days. Daughter came in from out of state and found pt on floor, in feces and very altered. EMS dispatched an gave pt D10 for finding of glucose 19.  In ED, pt felt to be dehydrated and has been given 2.5L IVF  CT H obtained, and is without acute changes. BCx and Ucx ordered but picture in ED more consistent with dehydration, hypoglycemia than infection and pt not started on abx.  Pt mentation largely improved in ED. She endorses recent med change -- is now on a diuretic (unknown which) for BLE edema.   Labs significant for Na 134 K 3.2 CO2 12 Glu 159 BUN 46 Cr 2.68  LA 5.4 WBC 9.5   Past Medical History  Hypothyroid  Significant Hospital Events   [place] 10/19 AM    Consults:    Procedures:    Significant Diagnostic Tests:  CT H 10/17> chronic microvascular ischemic changes, mild atrophy related to age.  Micro Data:  10/17 SARS Cov2> neg 10/17 BCx> 1 of 2 staph species, contaminant, 2 of 2 GNRs 10/17 UCx> <10k CFU 10/17: MRSA screen negative Antimicrobials:  10/17 zosyn >10/18 10/18 cipro (GNR resistant to all but cipro, imipenem)>>   Interim history/subjective:  Was lethargic most of day non focal on exam. CT head negative for bleed (severe thrombocytopenia). Cr stable, dry mucous membranes. Mental status better this AM, new dysarthria.  Objective   Blood pressure (!) 125/55, pulse 89, temperature (!) 96.4 F (35.8 C), temperature source Axillary, resp. rate 12, height 5\' 5"  (1.651 m), weight 57.5 kg, SpO2 100 %.        Intake/Output Summary (Last 24 hours) at 04/17/2020 1048 Last data filed at 04/16/2020  1800 Gross per 24 hour  Intake 200 ml  Output --  Net 200 ml   Filed Weights   04/14/20 0500 04/15/20 0500 04/16/20 0349  Weight: 49.7 kg 58 kg 57.5 kg    Examination: General: Thin frail older adult F, reclined in bed NAD HENT: NCAT Exophthalmus pink dry mm trachea midline Lungs: CTAb symmetrical chest expansion no accessory use on 3L [place] Cardiovascular: RRR s1s2 no rgm cap refill brisk Abdomen: soft flat ndnt + bowel sounds Extremities: significant pitting BLUE edema. No obvious joint deformity no cyanosis or clubbing, erythema of calves, forearms, blistering bullae of R thigh Neuro: AAO, following commands, PERRL, dysarthric speech  Resolved Hospital Problem list     Assessment & Plan:   Acute encephalopathy: multifactorial including hypoglycemia, dehydration, sepsis from presumed colitis and bacteremia. CTH without acute changes. Intermittent delirium, hyperactive and hypoactive. Fear delirium may be sign of active dying. - delirium precautions   GNR bacteremia. Septic shock: resolved. hypovolemic shock: resolved. -cipro, plan 14 days  AKI: suspected-- unknown baseline. Stable. Attempted diuresis given overload on exam. Poor PO intake last 24 hours. Cr stable 1.9-2 throughout admission. P -Gentle fluids  Dysarthria: suspect due to dry mouth, critical illness. CT head negative x2. No other focal deficits on face or elsewhere. -SLP eval, consider MRI to eval for small focal stroke  Volume overload: suspected due to hypoalbuminemia, possible liver dysfunction. Dry  MM, suspect third spacing.  -CTM  Thrombocytopenia: Due to septic shock, GNR bacteremia. S/p 1u pltsAM 10/20. No acute clot on dopplers, low suspicion for HIT given timing. Trend up 10/21 AM. -CTM, ppx lovenox d/c'd  Severe Protein calorie malnutrition: Appears to be wasting on exam. 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 - no role for anticoagulation at this time and thrombocytopenia prohibits this  Hypothyroidism: TSH over 9, FT4 WNL -resume home synthroid   Best practice:  Diet: Regular  Pain/Anxiety/Delirium protocol (if indicated): na VAP protocol (if indicated): na DVT prophylaxis: lovenox  GI prophylaxis: na Glucose control: monitor  Mobility: OOB to chair Code Status: Full  Family Communication: discussed with patient and daughter Disposition: Transfer out of icu  Labs   CBC: Recent Labs  Lab 04/12/20 2349 04/13/20 0310 04/14/20 2057 04/14/20 2057 04/15/20 0529 04/15/20 0728 04/16/20 0310 04/16/20 1120 04/17/20 0614  WBC 9.5   < > 27.4*  --  29.9*  --  27.9* 24.2* 17.3*  NEUTROABS 8.3*  --  26.0*  --   --   --   --  22.0*  --   HGB 9.4*   < > 6.1*  --  8.2*  --  9.0* 8.8* 9.0*  HCT 33.3*   < > 18.8*  --  25.2*  --  28.1* 27.2* 28.6*  MCV 80.4   < > 75.2*  --  78.3*  --  80.3 78.4* 80.6  PLT 137*   < > 33*   < > 19* 17* 5* 30* 11*   < > = values in this interval not displayed.    Basic Metabolic Panel: Recent Labs  Lab 04/13/20 0655 04/13/20 7858 04/14/20 0249 04/14/20 2057 04/15/20 0529 04/16/20 0310 04/17/20 0614  NA 135   < > 135 135 132* 135 136  K 3.0*   < > 3.0* 3.5 3.7 3.4* 3.4*  CL 108   < > 98 96* 97* 100 99  CO2 11*   < > 19* 24 24 22 25   GLUCOSE 72   < > 97 76 102* 81 90  BUN 45*   < > 41* 42* 40* 42* 44*  CREATININE 2.41*   < > 2.20* 1.98* 1.98* 1.96* 1.99*  CALCIUM 6.9*   < > 6.1* 6.5* 6.2* 6.7* 7.3*  MG 1.6*  --   --   --   --   --   --   PHOS 5.4*  --   --   --   --   --   --    < > = values in this interval not displayed.   GFR: Estimated Creatinine Clearance: 22 mL/min (A) (by C-G formula based on SCr of 1.99 mg/dL (H)). Recent Labs  Lab 04/13/20 0150 04/13/20 0310  04/13/20 0655 04/13/20 0655 04/13/20 1940 04/14/20 0249 04/14/20 0249 04/14/20 2057 04/14/20 2057 04/14/20 2316 04/15/20 0529 04/16/20 0310 04/16/20 1120 04/17/20 0614  PROCALCITON 4.81  --   --   --   --  3.84  --   --   --   --  2.86  --   --   --   WBC  --    < > 4.6   < >  --  28.0*   < > 27.4*   < >  --  29.9* 27.9* 24.2* 17.3*  LATICACIDVEN  --    < > 6.0*  --  6.0*  --   --  2.0*  --  1.6  --   --   --   --    < > = values in this interval not displayed.    Liver Function Tests: Recent Labs  Lab 04/12/20 2349 04/13/20 0655 04/14/20 2057  AST 46* 65* 145*  ALT 26 28 49*  ALKPHOS 178* 142* 126  BILITOT 0.6 0.5 1.2  PROT 4.8* 4.1* 5.0*  ALBUMIN 1.3* 1.2* 3.0*   No results for input(s): LIPASE, AMYLASE in the last 168 hours. Recent Labs  Lab 04/13/20 0043  AMMONIA 51*    ABG    Component Value Date/Time   PHART 7.388 04/15/2020 1035   PCO2ART 42.8 04/15/2020 1035   PO2ART 107 04/15/2020 1035   HCO3 25.2 04/15/2020 1035   TCO2 12 (L) 04/13/2020 0832   ACIDBASEDEF 12.0 (H) 04/13/2020 0832   O2SAT 97.1 04/15/2020 1035     Coagulation Profile: Recent Labs  Lab 04/14/20 0845 04/14/20 2316  INR 1.6* 1.6*    Cardiac Enzymes: Recent Labs  Lab 04/13/20 0205  CKTOTAL 392*    HbA1C: No results found for: HGBA1C  CBG: Recent Labs  Lab 04/16/20 1824 04/16/20 1951 04/16/20 2318 04/17/20 0325 04/17/20 0740  GLUCAP 106* 102* 86 92 74      Not on File   Home Medications  Prior to Admission medications   Not on File     Critical care time: n/a

## 2020-04-17 NOTE — Progress Notes (Signed)
Pharmacy Antibiotic Note  Michelle Avila is a 74 y.o. female admitted on 04/12/2020 with sepsis of unknown origin and is currently on vasopressors. Patient has cellulitis and possible colitis, possible pancreatitis.   04/17/2020  Hypothermic/AF, Tm 97.5 WBC 17.3  SCr 1.99, CrCl ~ 22 ml/min,  PCT 4.81> 3.84>2.86 Off pressors BCx: FLAVOBACTERIUM ODORATUM- sens cipro, imipenem, resis all others x   Plan: Continue cipro 400 mg IV q24 F/U renal function, culture results, clinical course Will follow remotely  Thank you for the consult  Height: 5\' 5"  (165.1 cm) Weight: 57.5 kg (126 lb 12.2 oz) IBW/kg (Calculated) : 57  Temp (24hrs), Avg:96.6 F (35.9 C), Min:95.5 F (35.3 C), Max:97.5 F (36.4 C)  Recent Labs  Lab 04/13/20 0310 04/13/20 0310 04/13/20 0655 04/13/20 0655 04/13/20 1940 04/14/20 0249 04/14/20 0249 04/14/20 2057 04/14/20 2316 04/15/20 0529 04/16/20 0310 04/16/20 1120 04/17/20 0614  WBC 5.4   < > 4.6   < >  --  28.0*   < > 27.4*  --  29.9* 27.9* 24.2* 17.3*  CREATININE 2.58*   < > 2.41*   < >  --  2.20*  --  1.98*  --  1.98* 1.96*  --  1.99*  LATICACIDVEN 7.1*  --  6.0*  --  6.0*  --   --  2.0* 1.6  --   --   --   --    < > = values in this interval not displayed.    Estimated Creatinine Clearance: 22 mL/min (A) (by C-G formula based on SCr of 1.99 mg/dL (H)).    Not on File  Antimicrobials this admission: 10/17 Zosyn >> 10/18 10/17 vancomycin >> 10/18 10/18 cipro> Dose adjustments this admission:  Microbiology results: 10/17 BCx2: GPC in clusters in 1/4 bottles>  staph species no mec A (deemed contaminant)  and GNR 3/4 bottles =  FLAVOBACTERIUM ODORATUM 10/17 UCx:  insig growth F 10/17 GI PCR: neg 10/17 MRSA PCR: neg 10/17 Resp PCR: COVID - FLU A/B: neg 10/18 c diff neg  Thank you for allowing pharmacy to be a part of this patient's care.  Ulice Dash, PharmD, BCPS [phone] 04/17/2020 8:13 AM

## 2020-04-17 NOTE — Progress Notes (Signed)
[facility] Progress Note Patient Name: Michelle Avila DOB: 28-Jan-1946 MRN: [id number]   Date of Service  04/17/2020  HPI/Events of Note  Patient admitted with hypoglycemia and altered mental status, oral intake is limited and last blood sugar was 90 mg %.  eICU Interventions  D 5 % LR infusion started @ 75 ml / hour.        [name] 04/17/2020, 9:08 PM

## 2020-04-17 NOTE — Progress Notes (Signed)
[facility] Progress Note Patient Name: Michelle Avila DOB: March 24, 1946 MRN: [id number]   Date of Service  04/17/2020  HPI/Events of Note  Thrombocytopenia - Platelets = 11. Not actively bleeding.   eICU Interventions  Continue present management.      Intervention Category Major Interventions: Other:  Juanitta Earnhardt [name] 04/17/2020, 6:45 AM

## 2020-04-17 NOTE — Progress Notes (Signed)
Hypoglycemic Event  CBG: 65  Treatment: D50 25 mL (12.5 gm)  Symptoms: None  Follow-up CBG: Time:1238 CBG Result:119  Possible Reasons for Event: Inadequate meal intake, NPO  Comments/MD notified:Dr. Hunsucker    Leeanne Deed

## 2020-04-17 NOTE — Progress Notes (Signed)
Platelets dropped to 11 from 30. Notified Dr. Oletta Darter with no new order. No bleeding/new bruising or any hematoma noted. Will continue to monitor.

## 2020-04-17 NOTE — Evaluation (Addendum)
Clinical/Bedside Swallow Evaluation Patient Details  Name: Michelle Avila MRN: [id number] Date of Birth: 06-Nov-1945  Today's Date: 04/17/2020 Time: SLP Start Time (ACUTE ONLY): 1510 SLP Stop Time (ACUTE ONLY): 1545 SLP Time Calculation (min) (ACUTE ONLY): 35 min  Past Medical History: History reviewed. No pertinent past medical history. Past Surgical History:The histories are not reviewed yet. Please review them in the "History" navigator section and refresh this [place]. HPI:  Pt presented with AMS, was found down covered in feces at home by her neighbors.  PMH for smoking, Pneumonitis due to inhalation of food and vomit in 2016, Pressure ulcer of sacral region, stage 1, Acute respiratory failure with hypoxia, protein-calorie malnutrition, bacterial pneumonia, was living alone, Diarrhea, Fluid overload, Altered mental status, Klebsiella pneumoniae (k. pneumoniae), Pure hypercholesterolemia, Essential (primary) hypertension, Hypotension, Tobacco use, bilirubin metabolism, Escherichia coli (E. coli), , Hypomagnesemia, Hypokalemia, Anemia, unspecified, Body mass index (BMI) 19.9 or less, adult malaise.   Assessment / Plan / Recommendation Clinical Impression  Patient currently is severely dysarthric and having difficulty holding her head in upright position due to gross weakness.  She appears cachectic and admits to premorbid issues with coughing/choking on liquids more than food.  Dried bloody secretions retained on bilateral palatal region which SLP removed using oral suction and moist toothette to clear secretions.  She is severely dysarthric with minimal improvement after oral care.  No focal CN deficits but gross weakness apparent.   Pt given single small ice chips with delayed swallow, subtle throat clearing immediately post swallow with delayed nonproductive cough.  Pt then reported "I need air" and complained of dizziness.   SLP notified RN of pt's complaints, BP was ok and oxygen  saturation was adequate - SLP advised pt to breathe in through her nose and out through her mouth.  Recommend NPO x single ice chips, needed medicine with applesauce and SLP will follow up tomorrow morning.  Recommend pt have an MBS prior to po administration due to clinical indications of difficulties and pulmonary status as well as complex med hx including asp pneumonitis, malnutrition, premorbid coughing on liquids.  RN informed of recommendations as well as pt and pt agreeable to plan. SLP Visit Diagnosis: Dysphagia, oral phase (R13.11);Dysphagia, unspecified (R13.10);Dysarthria and anarthria (R47.1)    Aspiration Risk  Moderate aspiration risk    Diet Recommendation NPO;Ice chips PRN after oral care (needed medications with puree -)   Medication Administration: Whole meds with puree    Other  Recommendations Oral Care Recommendations: Oral care QID   Follow up Recommendations Skilled Nursing facility (TBD)      Frequency and Duration min 2x/week  2 weeks       Prognosis Prognosis for Safe Diet Advancement: Guarded Barriers to Reach Goals: Time post onset      Swallow Study   General HPI: Pt presented with AMS, was found down covered in feces at home by her neighbors.  PMH for smoking, Pneumonitis due to inhalation of food and vomit in 2016, Pressure ulcer of sacral region, stage 1, Acute respiratory failure with hypoxia, protein-calorie malnutrition, bacterial pneumonia, was living alone, Diarrhea, Fluid overload, Altered mental status, Klebsiella pneumoniae (k. pneumoniae), Pure hypercholesterolemia, Essential (primary) hypertension, Hypotension, Tobacco use, bilirubin metabolism, Escherichia coli (E. coli), , Hypomagnesemia, Hypokalemia, Anemia, unspecified, Body mass index (BMI) 19.9 or less, adult malaise. Type of Study: Bedside Swallow Evaluation Diet Prior to this Study: Regular;Thin liquids Temperature Spikes Noted: No Respiratory Status: Room air History of Recent  Intubation: No Behavior/Cognition: Alert;Cooperative;Pleasant mood  Oral Cavity Assessment: Within Functional Limits Oral Care Completed by SLP: YES EXTENSIVE Oral Cavity - Dentition: Adequate natural dentition Vision: Functional for self-feeding Self-Feeding Abilities: Total assist Patient Positioning: Partially reclined Baseline Vocal Quality: Low vocal intensity Volitional Cough: Strong Volitional Swallow: Able to elicit    Oral/Motor/Sensory Function Overall Oral Motor/Sensory Function: Generalized oral weakness (pt with severe dysarthria and xerostomia, dried bloody secretions retained on roof of mouth-post)   Ice Chips Ice chips: Impaired Presentation: Spoon Pharyngeal Phase Impairments: Suspected delayed Swallow;Throat Clearing - Immediate   Thin Liquid Thin Liquid: Not tested    Nectar Thick Nectar Thick Liquid: Not tested   Honey Thick Honey Thick Liquid: Not tested   Puree Puree: Not tested   Solid     Solid: Not tested      Michelle Avila 04/17/2020,5:02 PM  Kathleen Lime, MS [facility] SLP Acute Rehab Services Office [phone] Pager [phone]

## 2020-04-18 ENCOUNTER — Inpatient Hospital Stay (HOSPITAL_COMMUNITY): Payer: Medicare Other

## 2020-04-18 DIAGNOSIS — R7881 Bacteremia: Secondary | ICD-10-CM | POA: Diagnosis not present

## 2020-04-18 DIAGNOSIS — A419 Sepsis, unspecified organism: Secondary | ICD-10-CM | POA: Diagnosis not present

## 2020-04-18 DIAGNOSIS — R579 Shock, unspecified: Secondary | ICD-10-CM | POA: Diagnosis not present

## 2020-04-18 DIAGNOSIS — E860 Dehydration: Secondary | ICD-10-CM | POA: Diagnosis not present

## 2020-04-18 LAB — MAGNESIUM: Magnesium: 2.2 mg/dL (ref 1.7–2.4)

## 2020-04-18 LAB — CBC
HCT: 23.3 % — ABNORMAL LOW (ref 36.0–46.0)
Hemoglobin: 7.3 g/dL — ABNORMAL LOW (ref 12.0–15.0)
MCH: 25.1 pg — ABNORMAL LOW (ref 26.0–34.0)
MCHC: 31.3 g/dL (ref 30.0–36.0)
MCV: 80.1 fL (ref 80.0–100.0)
Platelets: 49 10*3/uL — ABNORMAL LOW (ref 150–400)
RBC: 2.91 MIL/uL — ABNORMAL LOW (ref 3.87–5.11)
RDW: 25.5 % — ABNORMAL HIGH (ref 11.5–15.5)
WBC: 11.2 10*3/uL — ABNORMAL HIGH (ref 4.0–10.5)
nRBC: 0 % (ref 0.0–0.2)

## 2020-04-18 LAB — PREPARE PLATELET PHERESIS: Unit division: 0

## 2020-04-18 LAB — GLUCOSE, CAPILLARY
Glucose-Capillary: 93 mg/dL (ref 70–99)
Glucose-Capillary: 113 mg/dL — ABNORMAL HIGH (ref 70–99)
Glucose-Capillary: 115 mg/dL — ABNORMAL HIGH (ref 70–99)
Glucose-Capillary: 168 mg/dL — ABNORMAL HIGH (ref 70–99)
Glucose-Capillary: 132 mg/dL — ABNORMAL HIGH (ref 70–99)

## 2020-04-18 LAB — HEPATIC FUNCTION PANEL
ALT: 65 U/L — ABNORMAL HIGH (ref 0–44)
AST: 67 U/L — ABNORMAL HIGH (ref 15–41)
Albumin: 1.9 g/dL — ABNORMAL LOW (ref 3.5–5.0)
Alkaline Phosphatase: 283 U/L — ABNORMAL HIGH (ref 38–126)
Bilirubin, Direct: 0.5 mg/dL — ABNORMAL HIGH (ref 0.0–0.2)
Indirect Bilirubin: 1 mg/dL — ABNORMAL HIGH (ref 0.3–0.9)
Total Bilirubin: 1.5 mg/dL — ABNORMAL HIGH (ref 0.3–1.2)
Total Protein: 4.5 g/dL — ABNORMAL LOW (ref 6.5–8.1)

## 2020-04-18 LAB — BASIC METABOLIC PANEL
Anion gap: 11 (ref 5–15)
BUN: 46 mg/dL — ABNORMAL HIGH (ref 8–23)
CO2: 27 mmol/L (ref 22–32)
Calcium: 7.2 mg/dL — ABNORMAL LOW (ref 8.9–10.3)
Chloride: 101 mmol/L (ref 98–111)
Creatinine, Ser: 2.01 mg/dL — ABNORMAL HIGH (ref 0.44–1.00)
GFR, Estimated: 25 mL/min — ABNORMAL LOW (ref >60)
Glucose, Bld: 119 mg/dL — ABNORMAL HIGH (ref 70–99)
Potassium: 3.4 mmol/L — ABNORMAL LOW (ref 3.5–5.1)
Sodium: 139 mmol/L (ref 135–145)

## 2020-04-18 LAB — BPAM PLATELET PHERESIS
Blood Product Expiration Date: 202110222359
ISSUE DATE / TIME: 202110212340
Unit Type and Rh: 6200

## 2020-04-18 MED ORDER — TRAMADOL HCL 50 MG PO TABS
50.0000 mg | ORAL_TABLET | Freq: Once | ORAL | Status: AC
  Administered 2020-04-18: 50 mg via ORAL
  Filled 2020-04-18: qty 1

## 2020-04-18 MED ORDER — POTASSIUM CHLORIDE 10 MEQ/100ML IV SOLN
10.0000 meq | INTRAVENOUS | Status: AC
  Administered 2020-04-18 (×3): 10 meq via INTRAVENOUS
  Filled 2020-04-18 (×3): qty 100

## 2020-04-18 MED ORDER — LEVOFLOXACIN IN D5W 750 MG/150ML IV SOLN
750.0000 mg | INTRAVENOUS | Status: DC
  Administered 2020-04-18 – 2020-04-22 (×3): 750 mg via INTRAVENOUS
  Filled 2020-04-18 (×3): qty 150

## 2020-04-18 MED ORDER — RESOURCE THICKENUP CLEAR PO POWD
ORAL | Status: DC | PRN
  Filled 2020-04-18 (×2): qty 125

## 2020-04-18 MED ORDER — LIP MEDEX EX OINT
TOPICAL_OINTMENT | CUTANEOUS | Status: AC
  Administered 2020-04-18: 1
  Filled 2020-04-18: qty 7

## 2020-04-18 NOTE — Progress Notes (Signed)
PROGRESS NOTE    Michelle Avila  QBH:[id number] DOB: 15-Jan-1946 DOA: 04/12/2020 PCP: Venetia Maxon, Sharon Mt, MD  Brief Narrative:  The patient is a 74 year old chronically ill-appearing cachectic Caucasian female with a past medical history significant for hypothyroidism who presented with altered mental status, hypoglycemia, dehydration.  From report neighbors had not seen or heard from her in several days and daughter came in from out of state and found the patient on the floor in feces and was very altered.  EMS was dispatched and upon evaluation her blood sugar was 19 and so they gave her D10.  She was brought to the ED and found to be dehydrated and given 2-1/2 L of IV fluids.  CT head was obtained without acute changes.  Blood and urine cultures were ordered but the ED felt the picture was more consistent with dehydration and hypoglycemia rather than infection so she is not started on antibiotics at that time.  Her mentation largely improved in the ED and she endorsed a recent med change and was placed on a diuretic for her bilateral lower extremity edema.  Further work-up revealed that she ended up going into septic shock and hypovolemic shock from a gram-negative bacteremia likely from being in her feces and possible colitis.  She was started on IV Zosyn and changed to ciprofloxacin but I evaluated and she is on IM ceftriaxone.  She was placed on pressors and had encephalopathy and electrolyte derangements.  They were adjusted and corrected and she was given albumin x2.  She was stabilized in the ICU and had some dysphagia so she was n.p.o. and SLP evaluated and they are now recommending a dysphagia 2 diet with nectar thick liquids.  She is transferred to [facility] service 04/18/2020 and when I evaluated her today she is very frail and cachectic and likely has failure to thrive so we will consult palliative care and dietitian for further evaluation recommendations.  During her hospitalization she had  critical illness thrombocytopenia and was given 2 packs of platelets.  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 -In the ED she is found to be dehydrated and given 2.5 L of IV fluids -Daughter found the patient on the floor in feces and very altered and patient initially had a blood glucose of 19 -Urine culture showing less than 10,000 colony-forming units of insignificant growth -We will change IM ceftriaxone to IV levofloxacin for now; IV Zosyn has now been stopped -WBC is improving and trending down and has gone from 27.9 -> 24.2 ->  17.3 -> 11.2 -Procalcitonin level went from 4.81 and trended down to 2.86  -Lactic Acid level went from 6.0 and then trended down to 1.6 -She is now off of pressors -IV fluid was resumed at 75 mL's per hour with D5 and lactated Ringer's and will discontinue now given her hypoalbuminemia and likely volume overload. -Changed ceftriaxone to IV Levofloxacin for 14 Days -We will obtain PT and OT to further evaluate and treat and have palliative care discuss goals of care given her significant failure to thrive  Acute Encephalopathy -Likely multifactorial including her severe hypoglycemia on admission, dehydration, sepsis from her colitis and bacteremia -Initial head and repeat CT done had no acute changes  -She had ICU delirium and was hyperactive and hypoactive but the critical care physician feels that the  delirium may be a sign that she may be actively dying -Delirium precautions initiated -Palliative care consulted for further goals of care discussion  Volume overload  -In the setting of her poor nutritional status and possible liver dysfunction -She appeared dry yesterday and so she was initiated on D5 in LR and she is continues with respite next -Now that she has been placed on a diet  will stop IV fluid hydration and continue to monitor closely  Dysphagia -Patient has moderately severe oral pharyngeal dysphagia; today SLP evaluated and recommended n.p.o. 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  She has had poor p.o. intake in the last 24 hours but now has been cleared for diet. -Avoid further nephrotoxic medications, contrast dyes, hypotension and renally dose medications -Repeat CMP in the a.m.  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   Hypothyroidism -Patient's TSH was 9.921 and free T4 0.67 -Her levothyroxine has been resumed at  112 mcg   Abnormal LFTs  -Still somewhat elevated but trending down from earlier on admission -patient's AST is 67 and ALT is now 65 -Liver Doppler done on 10.18 showed "Widely patent hepatic vasculature with normal directional flow. Nonspecific atrophy of the left lobe of the liver as demonstrated on preceding abdominal CT. No discrete hepatic lesions. Trace amount of perihepatic ascites and small/trace bilateral effusions as was demonstrated on preceding abdominal CT. Cholelithiasis without evidence of cholecystitis." -Continue monitor and trend and repeat CMP in a.m.  Thrombocytopenia -Patient's Platelet Count dropped to 5 and now slowly trending up as it has gone to 30 -> 11 -> 49 -In the Setting of Septic Shock and her gram-negative bacteremia -She is transfused 2 packs of platelets  -There is low suspicion for HIT given the timing but Lovenox has been discontinued -Continue to Monitor for S/Sx of Bleeding; Currently no overt bleeding noted -Repeat CBC in the AM  Hypokalemia -Patient's K+ was 3.4 this AM -Replete with IV KCl 30 mEQ given that she was n.p.o. this morning -Check Mag Level but was 2.2 -Continue to Monitor and Replete as Necessary -Repeat CMP  DVT prophylaxis: SCDs given her significant thrombocytopenia prohibiting pharmacological prophylaxis Code Status: DO  NOT RESUSCITATE Family Communication: Spoke with the daughter at bedside Disposition Plan: Pending further clinical improvement back to baseline and tolerance of p.o. diet; she will need PT OT to further evaluate and treat  Status is: Inpatient  Remains inpatient appropriate because:Unsafe d/c plan, IV treatments appropriate due to intensity of illness or inability to take PO and Inpatient level of care appropriate due to severity of illness   Dispo: The patient is from: Home              Anticipated d/c is to: TBD              Anticipated d/c date is: 2 days              Patient currently is not medically  stable to d/c.  Consultants:   PCCM Transfer  Palliative care medicine  Significant Diagnostic Tests:  CT H 10/17> chronic microvascular ischemic changes, mild atrophy related to age.  Micro Data:  10/17 SARS Cov2> neg 10/17 BCx> 1 of 2 staph species, contaminant, 2 of 2 GNRs 10/17 UCx> <10k CFU 10/17: MRSA screen negative Antimicrobials:  10/17 zosyn >10/18 10/18 cipro (GNR resistant to all but cipro, imipenem)>>  EchocardiogramIMPRESSIONS    1. Left ventricular ejection fraction, by estimation, is 55 to 60%. The  left ventricle has normal function. Left ventricular endocardial border  not optimally defined to evaluate regional wall motion. Left ventricular  diastolic parameters are consistent  with Grade II diastolic dysfunction (pseudonormalization). Elevated left  ventricular end-diastolic pressure.  2. Right ventricular systolic function is normal. The right ventricular  size is normal. There is normal pulmonary artery systolic pressure. The  estimated right ventricular systolic pressure is 69.6 mmHg.  3. The mitral valve is grossly normal. No evidence of mitral valve  regurgitation. No evidence of mitral stenosis.  4. The aortic valve is abnormal. There is moderate calcification of the  aortic valve. Aortic valve regurgitation is not visualized. Moderate  aortic valve sclerosis/calcification is present, without any evidence of  aortic stenosis.  5. The inferior vena cava is normal in size with <50% respiratory  variability, suggesting right atrial pressure of 8 mmHg.   FINDINGS  Left Ventricle: Left ventricular ejection fraction, by estimation, is 55  to 60%. The left ventricle has normal function. Left ventricular  endocardial border not optimally defined to evaluate regional wall motion.  The left ventricular internal cavity  size was normal in size. There is no left ventricular hypertrophy. Left  ventricular diastolic parameters are consistent with Grade  II diastolic  dysfunction (pseudonormalization). Elevated left ventricular end-diastolic  pressure.   Right Ventricle: The right ventricular size is normal. No increase in  right ventricular wall thickness. Right ventricular systolic function is  normal. There is normal pulmonary artery systolic pressure. The tricuspid  regurgitant velocity is 2.36 m/s, and  with an assumed right atrial pressure of 8 mmHg, the estimated right  ventricular systolic pressure is 29.5 mmHg.   Left Atrium: Left atrial size was normal in size.   Right Atrium: Right atrial size was normal in size.   Pericardium: There is no evidence of pericardial effusion.   Mitral Valve: Mild chordal SAM. The mitral valve is grossly normal. Mild  mitral annular calcification. No evidence of mitral valve regurgitation.  No evidence of mitral valve stenosis.   Tricuspid Valve: The tricuspid valve is normal in structure. Tricuspid  valve regurgitation is mild . No evidence of tricuspid stenosis.   Aortic Valve: The aortic valve  is abnormal. There is moderate  calcification of the aortic valve. Aortic valve regurgitation is not  visualized. Mild to moderate aortic valve sclerosis/calcification is  present, without any evidence of aortic stenosis.   Pulmonic Valve: The pulmonic valve was grossly normal. Pulmonic valve  regurgitation is trivial. No evidence of pulmonic stenosis.   Aorta: The aortic root is normal in size and structure.   Venous: The inferior vena cava is normal in size with less than 50%  respiratory variability, suggesting right atrial pressure of 8 mmHg.   IAS/Shunts: No atrial level shunt detected by color flow Doppler.   Additional Comments: A venous catheter is visualized in the right atrium.     LEFT VENTRICLE  PLAX 2D  LVIDd:     4.20 cm Diastology  LVIDs:     2.50 cm LV e' medial:  3.68 cm/s  LV PW:     1.00 cm LV E/e' medial: 19.0  LV IVS:    0.80 cm LV e'  lateral:  6.13 cm/s  LVOT diam:   2.00 cm LV E/e' lateral: 11.4  LV SV:     54  LV SV Index:  35  LVOT Area:   3.14 cm     RIGHT VENTRICLE  RV S prime:   11.20 cm/s  TAPSE (M-mode): 1.5 cm   LEFT ATRIUM       Index    RIGHT ATRIUM      Index  LA diam:    2.90 cm 1.88 cm/m RA Area:   15.00 cm  LA Vol (A2C):  50.7 ml 32.79 ml/m RA Volume:  34.20 ml 22.12 ml/m  LA Vol (A4C):  25.2 ml 16.30 ml/m  LA Biplane Vol: 39.6 ml 25.61 ml/m  AORTIC VALVE  LVOT Vmax:  90.50 cm/s  LVOT Vmean: 66.250 cm/s  LVOT VTI:  0.172 m    AORTA  Ao Root diam: 3.00 cm  Ao Asc diam: 2.70 cm   MITRAL VALVE        TRICUSPID VALVE  MV Area (PHT): 3.12 cm  TR Peak grad:  22.3 mmHg  MV Decel Time: 243 msec  TR Vmax:    236.00 cm/s  MV E velocity: 69.80 cm/s  MV A velocity: 69.00 cm/s SHUNTS  MV E/A ratio: 1.01    Systemic VTI: 0.17 m               Systemic Diam: 2.00 cm   LE VENOUS DUPLEX Summary:  RIGHT:  - Findings consistent with chronic deep vein thrombosis involving the  visualized segments of the right peroneal veins.  - A cystic structure is found in the popliteal fossa.    LEFT:  - There is no evidence of deep vein thrombosis in the lower extremity.  However, portions of this examination were limited- see technologist  comments above.    *See table(s) above for measurements and observations.    Antimicrobials:  Anti-infectives (From admission, onward)   Start     Dose/Rate Route Frequency Ordered Stop   04/17/20 2230  cefTRIAXone (ROCEPHIN) injection 1 g        1 g Intramuscular Every 24 hours 04/17/20 2144     04/17/20 1400  levofloxacin (LEVAQUIN) IVPB 500 mg  Status:  Discontinued        500 mg 100 mL/hr over 60 Minutes Intravenous Every 24 hours 04/17/20 1157 04/17/20 2144   04/14/20 1600  ciprofloxacin (CIPRO) IVPB 400 mg  Status:  Discontinued  400 mg 200 mL/hr over 60 Minutes  Intravenous Every 24 hours 04/14/20 1443 04/17/20 1157   04/13/20 2000  vancomycin (VANCOCIN) IVPB 1000 mg/200 mL premix        1,000 mg 200 mL/hr over 60 Minutes Intravenous  Once 04/13/20 1844 04/13/20 2111   04/13/20 1850  vancomycin variable dose per unstable renal function (pharmacist dosing)  Status:  Discontinued         Does not apply See admin instructions 04/13/20 1851 04/14/20 1003   04/13/20 1200  piperacillin-tazobactam (ZOSYN) IVPB 2.25 g  Status:  Discontinued        2.25 g 100 mL/hr over 30 Minutes Intravenous Every 8 hours 04/13/20 0639 04/14/20 1442   04/13/20 0230  piperacillin-tazobactam (ZOSYN) IVPB 3.375 g       Note to Pharmacy: Pharmacy to dose   3.375 g 100 mL/hr over 30 Minutes Intravenous  Once 04/13/20 0216 04/13/20 0342        Subjective: Seen and examined at bedside and denied any current pain.  Denies any nausea or vomiting currently and feels a little swollen.  Understand that she has been getting speech therapy evaluation today.  No chest pain, lightheadedness or dizziness.  No other concerns or complaints at this time.  Objective: Vitals:   04/18/20 0228 04/18/20 0249 04/18/20 0637 04/18/20 0659  BP: 137/71  (!) 155/66   Pulse: 92  84   Resp: 16  16   Temp: (!) 97.5 F (36.4 C)  (!) 97.5 F (36.4 C)   TempSrc: Oral  Oral   SpO2: (!) 89% 100% 100%   Weight:    55.3 kg  Height:        Intake/Output Summary (Last 24 hours) at 04/18/2020 0743 Last data filed at 04/18/2020 0630 Gross per 24 hour  Intake 1028.06 ml  Output 700 ml  Net 328.06 ml   Filed Weights   04/15/20 0500 04/16/20 0349 04/18/20 0659  Weight: 58 kg 57.5 kg 55.3 kg   Examination: Physical Exam:  Constitutional: Thin cachectic chronically ill-appearing Caucasian female currently in no acute distress appears calm but slightly uncomfortable Eyes: Lids and conjunctivae normal, sclerae anicteric  ENMT: External Ears, Nose appear normal. Grossly normal hearing.  Neck:  Appears normal, supple, no cervical masses, normal ROM, no appreciable thyromegaly; no appreciable JVD Respiratory: Diminished to auscultation bilaterally with slightly coarse breath sounds, no wheezing, rales, rhonchi or crackles. Normal respiratory effort and patient is not tachypenic. No accessory muscle use.  Cardiovascular: RRR, no murmurs / rubs / gallops. S1 and S2 auscultated.  Has 1-2+ lower extremity edema Abdomen: Soft, non-tender, mildly distended. Bowel sounds positive.  GU: Deferred. Musculoskeletal: No clubbing / cyanosis of digits/nails. No joint deformity upper and lower extremities.  Skin: Has some bruising and ecchymosis and lower extremity legs are weeping Neurologic: CN 2-12 grossly intact with no focal deficits.  Romberg sign and cerebellar reflexes not assessed.  Psychiatric: Normal judgment and insight. Alert and awake but not fully oriented. Normal mood and appropriate affect.   Data Reviewed: I have personally reviewed following labs and imaging studies  CBC: Recent Labs  Lab 04/12/20 2349 04/13/20 0310 04/14/20 2057 04/14/20 2057 04/15/20 0529 04/15/20 0529 04/15/20 0728 04/16/20 0310 04/16/20 1120 04/17/20 0614 04/18/20 0439  WBC 9.5   < > 27.4*   < > 29.9*  --   --  27.9* 24.2* 17.3* 11.2*  NEUTROABS 8.3*  --  26.0*  --   --   --   --   --  22.0*  --   --   HGB 9.4*   < > 6.1*   < > 8.2*  --   --  9.0* 8.8* 9.0* 7.3*  HCT 33.3*   < > 18.8*   < > 25.2*  --   --  28.1* 27.2* 28.6* 23.3*  MCV 80.4   < > 75.2*   < > 78.3*  --   --  80.3 78.4* 80.6 80.1  PLT 137*   < > 33*   < > 19*   < > 17* 5* 30* 11* 49*   < > = values in this interval not displayed.   Basic Metabolic Panel: Recent Labs  Lab 04/13/20 0655 04/13/20 1751 04/14/20 2057 04/15/20 0529 04/16/20 0310 04/17/20 0614 04/18/20 0439  NA 135   < > 135 132* 135 136 139  K 3.0*   < > 3.5 3.7 3.4* 3.4* 3.4*  CL 108   < > 96* 97* 100 99 101  CO2 11*   < > 24 24 22 25 27   GLUCOSE 72   < > 76  102* 81 90 119*  BUN 45*   < > 42* 40* 42* 44* 46*  CREATININE 2.41*   < > 1.98* 1.98* 1.96* 1.99* 2.01*  CALCIUM 6.9*   < > 6.5* 6.2* 6.7* 7.3* 7.2*  MG 1.6*  --   --   --   --   --   --   PHOS 5.4*  --   --   --   --   --   --    < > = values in this interval not displayed.   GFR: Estimated Creatinine Clearance: 21.1 mL/min (A) (by C-G formula based on SCr of 2.01 mg/dL (H)). Liver Function Tests: Recent Labs  Lab 04/12/20 2349 04/13/20 0655 04/14/20 2057  AST 46* 65* 145*  ALT 26 28 49*  ALKPHOS 178* 142* 126  BILITOT 0.6 0.5 1.2  PROT 4.8* 4.1* 5.0*  ALBUMIN 1.3* 1.2* 3.0*   No results for input(s): LIPASE, AMYLASE in the last 168 hours. Recent Labs  Lab 04/13/20 0043  AMMONIA 51*   Coagulation Profile: Recent Labs  Lab 04/14/20 0845 04/14/20 2316  INR 1.6* 1.6*   Cardiac Enzymes: Recent Labs  Lab 04/13/20 0205  CKTOTAL 392*   BNP (last 3 results) No results for input(s): PROBNP in the last 8760 hours. HbA1C: No results for input(s): HGBA1C in the last 72 hours. CBG: Recent Labs  Lab 04/17/20 1604 04/17/20 1945 04/17/20 2346 04/18/20 0421 04/18/20 0741  GLUCAP 95 82 92 93 113*   Lipid Profile: No results for input(s): CHOL, HDL, LDLCALC, TRIG, CHOLHDL, LDLDIRECT in the last 72 hours. Thyroid Function Tests: No results for input(s): TSH, T4TOTAL, FREET4, T3FREE, THYROIDAB in the last 72 hours. Anemia Panel: No results for input(s): VITAMINB12, FOLATE, FERRITIN, TIBC, IRON, RETICCTPCT in the last 72 hours. Sepsis Labs: Recent Labs  Lab 04/13/20 0150 04/13/20 0310 04/13/20 0655 04/13/20 1940 04/14/20 0249 04/14/20 2057 04/14/20 2316 04/15/20 0529  PROCALCITON 4.81  --   --   --  3.84  --   --  2.86  LATICACIDVEN  --    < > 6.0* 6.0*  --  2.0* 1.6  --    < > = values in this interval not displayed.    Recent Results (from the past 240 hour(s))  Culture, blood (routine x 2)     Status: Abnormal   Collection Time: 04/12/20 11:45 PM  Specimen: BLOOD RIGHT ARM  Result Value Ref Range Status   Specimen Description BLOOD RIGHT ARM  Final   Special Requests   Final    BOTTLES DRAWN AEROBIC AND ANAEROBIC Blood Culture adequate volume   Culture  Setup Time   Final    GRAM NEGATIVE RODS GRAM POSITIVE COCCI IN CLUSTERS IN BOTH AEROBIC AND ANAEROBIC BOTTLES CRITICAL RESULT CALLED TO, READ BACK BY AND VERIFIED WITH: PHARMD E WILLIAMSON [id number] [place] 1757 BY CM Performed at [facility], [place] [address], [place], [place] [address]    Culture (A)  Final    FLAVOBACTERIUM ODORATUM AEROCOCCUS VIRIDANS STAPHYLOCOCCUS XYLOSUS    Report Status 04/15/2020 FINAL  Final  Blood Culture ID Panel (Reflexed)     Status: Abnormal   Collection Time: 04/12/20 11:45 PM  Result Value Ref Range Status   Enterococcus faecalis NOT DETECTED NOT DETECTED Final   Enterococcus Faecium NOT DETECTED NOT DETECTED Final   Listeria monocytogenes NOT DETECTED NOT DETECTED Final   Staphylococcus species DETECTED (A) NOT DETECTED Final    Comment: CRITICAL RESULT CALLED TO, READ BACK BY AND VERIFIED WITH: PHARMD E WILLIAMSON [id number] AT 1757 BY CM    Staphylococcus aureus (BCID) NOT DETECTED NOT DETECTED Final   Staphylococcus epidermidis NOT DETECTED NOT DETECTED Final   Staphylococcus lugdunensis NOT DETECTED NOT DETECTED Final   Streptococcus species NOT DETECTED NOT DETECTED Final   Streptococcus agalactiae NOT DETECTED NOT DETECTED Final   Streptococcus pneumoniae NOT DETECTED NOT DETECTED Final   Streptococcus pyogenes NOT DETECTED NOT DETECTED Final   A.calcoaceticus-baumannii NOT DETECTED NOT DETECTED Final   Bacteroides fragilis NOT DETECTED NOT DETECTED Final   Enterobacterales NOT DETECTED NOT DETECTED Final   Enterobacter cloacae complex NOT DETECTED NOT DETECTED Final   Escherichia coli NOT DETECTED NOT DETECTED Final   Klebsiella aerogenes NOT DETECTED NOT DETECTED Final   Klebsiella oxytoca NOT DETECTED NOT DETECTED Final    Klebsiella pneumoniae NOT DETECTED NOT DETECTED Final   Proteus species NOT DETECTED NOT DETECTED Final   Salmonella species NOT DETECTED NOT DETECTED Final   Serratia marcescens NOT DETECTED NOT DETECTED Final   Haemophilus influenzae NOT DETECTED NOT DETECTED Final   Neisseria meningitidis NOT DETECTED NOT DETECTED Final   Pseudomonas aeruginosa NOT DETECTED NOT DETECTED Final   Stenotrophomonas maltophilia NOT DETECTED NOT DETECTED Final   Candida albicans NOT DETECTED NOT DETECTED Final   Candida auris NOT DETECTED NOT DETECTED Final   Candida glabrata NOT DETECTED NOT DETECTED Final   Candida krusei NOT DETECTED NOT DETECTED Final   Candida parapsilosis NOT DETECTED NOT DETECTED Final   Candida tropicalis NOT DETECTED NOT DETECTED Final   Cryptococcus neoformans/gattii NOT DETECTED NOT DETECTED Final    Comment: Performed at [facility], [address], [place], [place] [address]  Respiratory Panel by RT PCR (Flu A&B, Covid) - Nasopharyngeal Swab     Status: None   Collection Time: 04/13/20 12:13 AM   Specimen: Nasopharyngeal Swab  Result Value Ref Range Status   SARS Coronavirus 2 by RT PCR NEGATIVE NEGATIVE Final    Comment: (NOTE) SARS-CoV-2 target nucleic acids are NOT DETECTED.  The SARS-CoV-2 RNA is generally detectable in upper respiratoy specimens during the acute phase of infection. The lowest concentration of SARS-CoV-2 viral copies this assay can detect is 131 copies/mL. A negative result does not preclude SARS-Cov-2 infection and should not be used as the sole basis for treatment or other patient management decisions. A negative result  may occur with  improper specimen collection/handling, submission of specimen other than nasopharyngeal swab, presence of viral mutation(s) within the areas targeted by this assay, and inadequate number of viral copies (<131 copies/mL). A negative result must be combined with clinical observations, patient history, and  epidemiological information. The expected result is Negative.  Fact Sheet for Patients:  PinkCheek.be  Fact Sheet for Healthcare Providers:  GravelBags.it  This test is no t yet approved or cleared by the [place] FDA and  has been authorized for detection and/or diagnosis of SARS-CoV-2 by FDA under an Emergency Use Authorization (EUA). This EUA will remain  in effect (meaning this test can be used) for the duration of the COVID-19 declaration under Section 564(b)(1) of the Act, 21 U.S.C. section 360bbb-3(b)(1), unless the authorization is terminated or revoked sooner.     Influenza A by PCR NEGATIVE NEGATIVE Final   Influenza B by PCR NEGATIVE NEGATIVE Final    Comment: (NOTE) The Xpert Xpress SARS-CoV-2/FLU/RSV assay is intended as an aid in  the diagnosis of influenza from Nasopharyngeal swab specimens and  should not be used as a sole basis for treatment. Nasal washings and  aspirates are unacceptable for Xpert Xpress SARS-CoV-2/FLU/RSV  testing.  Fact Sheet for Patients: PinkCheek.be  Fact Sheet for Healthcare Providers: GravelBags.it  This test is not yet approved or cleared by the [place] FDA and  has been authorized for detection and/or diagnosis of SARS-CoV-2 by  FDA under an Emergency Use Authorization (EUA). This EUA will remain  in effect (meaning this test can be used) for the duration of the  Covid-19 declaration under Section 564(b)(1) of the Act, 21  U.S.C. section 360bbb-3(b)(1), unless the authorization is  terminated or revoked. Performed at [facility], [place] [address], [place], [place] [address]   Culture, blood (routine x 2)     Status: Abnormal   Collection Time: 04/13/20  1:18 AM   Specimen: BLOOD RIGHT HAND  Result Value Ref Range Status   Specimen Description BLOOD RIGHT HAND  Final   Special Requests   Final     BOTTLES DRAWN AEROBIC ONLY Blood Culture adequate volume   Culture  Setup Time   Final    GRAM NEGATIVE RODS AEROBIC BOTTLE ONLY CRITICAL RESULT CALLED TO, READ BACK BY AND VERIFIED WITH: PHARMD S CHRISTY [id number] AT 1820 BY CM Performed at [facility], [place] [address], [place], [place] [address]    Culture FLAVOBACTERIUM ODORATUM (A)  Final   Report Status 04/15/2020 FINAL  Final   Organism ID, Bacteria FLAVOBACTERIUM ODORATUM  Final      Susceptibility   Flavobacterium odoratum - MIC*    CEFAZOLIN >=64 RESISTANT Resistant     CEFTAZIDIME >=64 RESISTANT Resistant     CIPROFLOXACIN 1 SENSITIVE Sensitive     GENTAMICIN >=16 RESISTANT Resistant     IMIPENEM 4 SENSITIVE Sensitive     TRIMETH/SULFA >=320 RESISTANT Resistant     PIP/TAZO 32 INTERMEDIATE Intermediate     * FLAVOBACTERIUM ODORATUM  Urine culture     Status: Abnormal   Collection Time: 04/13/20  6:18 AM   Specimen: Urine, Catheterized  Result Value Ref Range Status   Specimen Description URINE, CATHETERIZED  Final   Special Requests NONE  Final   Culture (A)  Final    <10,000 COLONIES/mL INSIGNIFICANT GROWTH Performed at [facility], [place] [address], [place], [place] [address]    Report Status 04/14/2020 FINAL  Final  MRSA  PCR Screening     Status: None   Collection Time: 04/13/20 10:33 AM   Specimen: Nasopharyngeal  Result Value Ref Range Status   MRSA by PCR NEGATIVE NEGATIVE Final    Comment:        The GeneXpert MRSA Assay (FDA approved for NASAL specimens only), is one component of a comprehensive MRSA colonization surveillance program. It is not intended to diagnose MRSA infection nor to guide or monitor treatment for MRSA infections. Performed at [facility], [place] [address] [place], [place] [address]   Gastrointestinal Panel by PCR , Stool     Status: None   Collection Time: 04/13/20  2:36 PM   Specimen: Stool  Result Value Ref Range Status   Campylobacter species NOT  DETECTED NOT DETECTED Final   Plesimonas shigelloides NOT DETECTED NOT DETECTED Final   Salmonella species NOT DETECTED NOT DETECTED Final   Yersinia enterocolitica NOT DETECTED NOT DETECTED Final   Vibrio species NOT DETECTED NOT DETECTED Final   Vibrio cholerae NOT DETECTED NOT DETECTED Final   Enteroaggregative E coli (EAEC) NOT DETECTED NOT DETECTED Final   Enteropathogenic E coli (EPEC) NOT DETECTED NOT DETECTED Final   Enterotoxigenic E coli (ETEC) NOT DETECTED NOT DETECTED Final   Shiga like toxin producing E coli (STEC) NOT DETECTED NOT DETECTED Final   Shigella/Enteroinvasive E coli (EIEC) NOT DETECTED NOT DETECTED Final   Cryptosporidium NOT DETECTED NOT DETECTED Final   Cyclospora cayetanensis NOT DETECTED NOT DETECTED Final   Entamoeba histolytica NOT DETECTED NOT DETECTED Final   Giardia lamblia NOT DETECTED NOT DETECTED Final   Adenovirus F40/41 NOT DETECTED NOT DETECTED Final   Astrovirus NOT DETECTED NOT DETECTED Final   Norovirus GI/GII NOT DETECTED NOT DETECTED Final   Rotavirus A NOT DETECTED NOT DETECTED Final   Sapovirus (I, II, IV, and V) NOT DETECTED NOT DETECTED Final    Comment: Performed at [facility], [place] [place], [place] [address]  C Difficile Quick Screen w PCR reflex     Status: None   Collection Time: 04/14/20 11:40 AM   Specimen: Stool  Result Value Ref Range Status   C Diff antigen NEGATIVE NEGATIVE Final   C Diff toxin NEGATIVE NEGATIVE Final   C Diff interpretation No C. difficile detected.  Final    Comment: Performed at [facility], [place] [address] [place], [place] [address]     RN Pressure Injury Documentation: Pressure Injury 04/13/20 Heel Right Deep Tissue Pressure Injury - Purple or maroon localized area of discolored intact skin or blood-filled blister due to damage of underlying soft tissue from pressure and/or shear. spanning entire sole of foot (Active)  04/13/20 1030  Location: Heel  Location  Orientation: Right  Staging: Deep Tissue Pressure Injury - Purple or maroon localized area of discolored intact skin or blood-filled blister due to damage of underlying soft tissue from pressure and/or shear.  Wound Description (Comments): spanning entire sole of foot  Present on Admission: Yes     Pressure Injury 04/13/20 Heel Left Deep Tissue Pressure Injury - Purple or maroon localized area of discolored intact skin or blood-filled blister due to damage of underlying soft tissue from pressure and/or shear. (Active)  04/13/20 1030  Location: Heel  Location Orientation: Left  Staging: Deep Tissue Pressure Injury - Purple or maroon localized area of discolored intact skin or blood-filled blister due to damage of underlying soft tissue from pressure and/or shear.  Wound Description (Comments):   Present  on Admission: Yes     Pressure Injury 04/13/20 Sacrum Stage 3 -  Full thickness tissue loss. Subcutaneous fat may be visible but bone, tendon or muscle are NOT exposed. deep tissue injury surrounding stg 3 (Active)  04/13/20 1030  Location: Sacrum  Location Orientation:   Staging: Stage 3 -  Full thickness tissue loss. Subcutaneous fat may be visible but bone, tendon or muscle are NOT exposed.  Wound Description (Comments): deep tissue injury surrounding stg 3  Present on Admission: Yes     Pressure Injury 04/13/20 Knee Anterior;Left Deep Tissue Pressure Injury - Purple or maroon localized area of discolored intact skin or blood-filled blister due to damage of underlying soft tissue from pressure and/or shear. (Active)  04/13/20 1030  Location: Knee  Location Orientation: Anterior;Left  Staging: Deep Tissue Pressure Injury - Purple or maroon localized area of discolored intact skin or blood-filled blister due to damage of underlying soft tissue from pressure and/or shear.  Wound Description (Comments):   Present on Admission: Yes     Estimated body mass index is 20.3 kg/m as calculated  from the following:   Height as of this encounter: 5\' 5"  (1.651 m).   Weight as of this encounter: 55.3 kg.  Malnutrition Type:      Malnutrition Characteristics:      Nutrition Interventions:    Radiology Studies: CT HEAD WO CONTRAST  Result Date: 04/16/2020 CLINICAL DATA:  Altered mental status EXAM: CT HEAD WITHOUT CONTRAST TECHNIQUE: Contiguous axial images were obtained from the base of the skull through the vertex without intravenous contrast. COMPARISON:  04/13/2020 FINDINGS: Brain: No evidence of acute infarction, hemorrhage, hydrocephalus, extra-axial collection or mass lesion/mass effect. Scattered low-density changes within the periventricular and subcortical white matter compatible with chronic microvascular ischemic change. Mild diffuse cerebral volume loss. Vascular: Atherosclerotic calcifications involving the large vessels of the skull base. No unexpected hyperdense vessel. Skull: Normal. Negative for fracture or focal lesion. Sinuses/Orbits: No acute finding. Other: None. IMPRESSION: 1. No acute intracranial findings.  Stable exam from 04/13/2020 2. Mild chronic microvascular ischemic change and cerebral volume loss. Electronically Signed   By: Davina Poke D.O.   On: 04/16/2020 13:07   [place] EKG SITE RITE  Result Date: 04/16/2020 If Site Rite image not attached, placement could not be confirmed due to current cardiac rhythm.  Scheduled Meds: . cefTRIAXone (ROCEPHIN) IM  1 g Intramuscular Q24H  . Chlorhexidine Gluconate Cloth  6 each Topical Daily  . collagenase   Topical Daily  . levothyroxine  112 mcg Oral Q0600  . mouth rinse  15 mL Mouth Rinse BID  . sodium chloride flush  10-40 mL Intracatheter Q12H   Continuous Infusions: . sodium chloride 250 mL (04/13/20 0414)  . dextrose 5% lactated ringers Stopped (04/18/20 0220)  . potassium chloride      LOS: 5 days   Kerney Elbe, DO [facility] PAGER is on [place]  If 7PM-7AM, please contact  night-coverage [email or web address]

## 2020-04-18 NOTE — Progress Notes (Addendum)
MBS completed, full report to follow.  Pt's positioning is poor as she has a difficulty time even holding up her head.   is Pt presents with moderately severe oropharyngeal dysphagia c/b decreased oral coordination, propulsion, with premature spillage of barium into pharynx.  Pharyngeal swallow timing was impaired resulting in decreased laryngeal closure - allowing mild laryngeal penetration of nectar and thin liquids mixed with secretions and aspiration of thin.  Aspiration resulted in reflexive cough response that did not clear aspirates.  Cues for pt to clear her throat did not clear penetrates nor did a "hock" facilitate expectoration.  Chin tuck posture *which is pt's natural position* did not prevent penetration.    Pt also appears with prominent cricopharyngeus which traps some thin barium -causing SLP to question if pt may have primary esophageal dysphagia with secondary pharyngeal deficits.    Upon esophageal sweep, pt appeared with retention that she did not sense.  With more barium provided (pudding thick), retention appeared to worsen.  Pt then coughed which resulted in appearance of significant backflow/retrograde propulsion to the mid-esophagus without pt awareness.  Suspect primary aspiration occurs   from esophageal dysphagia but pt's oropharyngeal dysphagia factors into risk as well.    To mitigate aspiration risk, a modified diet may be helpful.  Would recommend pt have a palliative consult given her h/o asp pneumonitis, COPD and FTT.  Pt stated during MBS "I want to go home".  Due to overt aspiration of thin, recommend dys2/nectar diet and allow tsps of thin.     Kathleen Lime, MS [facility] SLP Acute Rehab Services Office [phone] Pager [phone]

## 2020-04-18 NOTE — Progress Notes (Signed)
Modified Barium Swallow Progress Note  Patient Details  Name: Michelle Avila MRN: [id number] Date of Birth: 1946-03-27  Today's Date: 04/18/2020  Modified Barium Swallow completed.  Full report located under Chart Review in the Imaging Section.  Brief recommendations include the following:  Clinical Impression  Pt's positioning is poor as she has a difficulty time even holding up her head.   Pt presents with moderately severe oropharyngeal dysphagia c/b decreased oral coordination, propulsion, with premature spillage of barium into pharynx.  Pharyngeal swallow timing was impaired resulting in decreased laryngeal closure - allowing mild laryngeal penetration of nectar and thin liquids mixed with secretions and aspiration of thin.  Aspiration resulted in reflexive cough response that did not clear aspirates.  Cues for pt to clear her throat did not clear penetrates nor did a "hock" facilitate expectoration.  Chin tuck posture *which is pt's natural position* did not prevent penetration but diminished it with nectar to trace.     Pt also appears with prominent cricopharyngeus which traps some thin barium -causing SLP to question if pt may have primary esophageal dysphagia with secondary pharyngeal deficits.     Upon esophageal sweep, pt appeared with retention that she did not sense.  With more barium provided (pudding thick), retention appeared to worsen.  Pt then coughed which resulted in appearance of significant backflow/retrograde propulsion to the mid-esophagus without pt awareness.    Suspect primary aspiration occurs  from esophageal dysphagia but pt's oropharyngeal dysphagia factors into risk as well.     To mitigate aspiration risk, a modified diet may be helpful.  Would recommend pt have a palliative consult given her h/o asp pneumonitis, COPD and FTT.    Pt stated during MBS "I want to go home".  Due to overt aspiration of thin, recommend dys2/nectar diet and allow tsps of  thin.  Swallow Evaluation Recommendations       SLP Diet Recommendations: Dysphagia 2 (Fine chop) solids;Nectar thick liquid (tsps of thin)   Liquid Administration via: Straw   Medication Administration: Whole meds with puree   Supervision: Full assist for feeding   Compensations: Slow rate;Small sips/bites;Chin tuck;Use straw to facilitate chin tuck (intermittent cough/throat clear) Small frequent meals   Postural Changes: Remain semi-upright after after feeds/meals (Comment);Seated upright at 90 degrees   Oral Care Recommendations: Oral care QID   Other Recommendations: Order thickener from pharmacy;Have oral suction available;Clarify dietary restrictions  Kathleen Lime, MS [facility] SLP Acute Rehab Services Office [phone] Pager [phone]    Macario Golds 04/18/2020,3:06 PM

## 2020-04-19 ENCOUNTER — Inpatient Hospital Stay (HOSPITAL_COMMUNITY): Payer: Medicare Other

## 2020-04-19 DIAGNOSIS — R579 Shock, unspecified: Secondary | ICD-10-CM | POA: Diagnosis not present

## 2020-04-19 DIAGNOSIS — D696 Thrombocytopenia, unspecified: Secondary | ICD-10-CM | POA: Diagnosis not present

## 2020-04-19 DIAGNOSIS — R7881 Bacteremia: Secondary | ICD-10-CM | POA: Diagnosis not present

## 2020-04-19 DIAGNOSIS — A419 Sepsis, unspecified organism: Secondary | ICD-10-CM | POA: Diagnosis not present

## 2020-04-19 DIAGNOSIS — R4182 Altered mental status, unspecified: Secondary | ICD-10-CM

## 2020-04-19 DIAGNOSIS — R918 Other nonspecific abnormal finding of lung field: Secondary | ICD-10-CM | POA: Diagnosis not present

## 2020-04-19 DIAGNOSIS — I5033 Acute on chronic diastolic (congestive) heart failure: Secondary | ICD-10-CM

## 2020-04-19 DIAGNOSIS — Z515 Encounter for palliative care: Secondary | ICD-10-CM

## 2020-04-19 DIAGNOSIS — E860 Dehydration: Secondary | ICD-10-CM | POA: Diagnosis not present

## 2020-04-19 DIAGNOSIS — Z7189 Other specified counseling: Secondary | ICD-10-CM

## 2020-04-19 LAB — COMPREHENSIVE METABOLIC PANEL
ALT: 65 U/L — ABNORMAL HIGH (ref 0–44)
AST: 52 U/L — ABNORMAL HIGH (ref 15–41)
Albumin: 1.9 g/dL — ABNORMAL LOW (ref 3.5–5.0)
Alkaline Phosphatase: 270 U/L — ABNORMAL HIGH (ref 38–126)
Anion gap: 13 (ref 5–15)
BUN: 40 mg/dL — ABNORMAL HIGH (ref 8–23)
CO2: 27 mmol/L (ref 22–32)
Calcium: 7.5 mg/dL — ABNORMAL LOW (ref 8.9–10.3)
Chloride: 102 mmol/L (ref 98–111)
Creatinine, Ser: 1.83 mg/dL — ABNORMAL HIGH (ref 0.44–1.00)
GFR, Estimated: 28 mL/min — ABNORMAL LOW (ref >60)
Glucose, Bld: 100 mg/dL — ABNORMAL HIGH (ref 70–99)
Potassium: 4.3 mmol/L (ref 3.5–5.1)
Sodium: 142 mmol/L (ref 135–145)
Total Bilirubin: 1 mg/dL (ref 0.3–1.2)
Total Protein: 4.8 g/dL — ABNORMAL LOW (ref 6.5–8.1)

## 2020-04-19 LAB — GLUCOSE, CAPILLARY
Glucose-Capillary: 122 mg/dL — ABNORMAL HIGH (ref 70–99)
Glucose-Capillary: 47 mg/dL — ABNORMAL LOW (ref 70–99)
Glucose-Capillary: 75 mg/dL (ref 70–99)
Glucose-Capillary: 80 mg/dL (ref 70–99)
Glucose-Capillary: 83 mg/dL (ref 70–99)
Glucose-Capillary: 92 mg/dL (ref 70–99)
Glucose-Capillary: 99 mg/dL (ref 70–99)

## 2020-04-19 LAB — CBC WITH DIFFERENTIAL/PLATELET
Abs Immature Granulocytes: 0.41 10*3/uL — ABNORMAL HIGH (ref 0.00–0.07)
Abs Immature Granulocytes: 0.16 10*3/uL — ABNORMAL HIGH (ref 0.00–0.07)
Basophils Absolute: 0 10*3/uL (ref 0.0–0.1)
Basophils Absolute: 0 10*3/uL (ref 0.0–0.1)
Basophils Relative: 0 %
Basophils Relative: 0 %
Eosinophils Absolute: 0 10*3/uL (ref 0.0–0.5)
Eosinophils Absolute: 0 10*3/uL (ref 0.0–0.5)
Eosinophils Relative: 0 %
Eosinophils Relative: 0 %
HCT: 27.4 % — ABNORMAL LOW (ref 36.0–46.0)
HCT: 26 % — ABNORMAL LOW (ref 36.0–46.0)
Hemoglobin: 8.2 g/dL — ABNORMAL LOW (ref 12.0–15.0)
Hemoglobin: 7.9 g/dL — ABNORMAL LOW (ref 12.0–15.0)
Immature Granulocytes: 5 %
Immature Granulocytes: 2 %
Lymphocytes Relative: 8 %
Lymphocytes Relative: 10 %
Lymphs Abs: 0.6 10*3/uL — ABNORMAL LOW (ref 0.7–4.0)
Lymphs Abs: 0.9 10*3/uL (ref 0.7–4.0)
MCH: 25.8 pg — ABNORMAL LOW (ref 26.0–34.0)
MCH: 25.8 pg — ABNORMAL LOW (ref 26.0–34.0)
MCHC: 29.9 g/dL — ABNORMAL LOW (ref 30.0–36.0)
MCHC: 30.4 g/dL (ref 30.0–36.0)
MCV: 86.2 fL (ref 80.0–100.0)
MCV: 85 fL (ref 80.0–100.0)
Monocytes Absolute: 0.5 10*3/uL (ref 0.1–1.0)
Monocytes Absolute: 0.6 10*3/uL (ref 0.1–1.0)
Monocytes Relative: 6 %
Monocytes Relative: 6 %
Neutro Abs: 6.4 10*3/uL (ref 1.7–7.7)
Neutro Abs: 7.5 10*3/uL (ref 1.7–7.7)
Neutrophils Relative %: 81 %
Neutrophils Relative %: 82 %
Platelets: 15 10*3/uL — CL (ref 150–400)
Platelets: 10 10*3/uL — CL (ref 150–400)
RBC: 3.18 MIL/uL — ABNORMAL LOW (ref 3.87–5.11)
RBC: 3.06 MIL/uL — ABNORMAL LOW (ref 3.87–5.11)
RDW: 25.8 % — ABNORMAL HIGH (ref 11.5–15.5)
RDW: 25.4 % — ABNORMAL HIGH (ref 11.5–15.5)
WBC: 7.9 10*3/uL (ref 4.0–10.5)
WBC: 9.2 10*3/uL (ref 4.0–10.5)
nRBC: 0 % (ref 0.0–0.2)
nRBC: 0 % (ref 0.0–0.2)

## 2020-04-19 LAB — MAGNESIUM: Magnesium: 2.2 mg/dL (ref 1.7–2.4)

## 2020-04-19 LAB — BRAIN NATRIURETIC PEPTIDE: B Natriuretic Peptide: 1113 pg/mL — ABNORMAL HIGH (ref 0.0–100.0)

## 2020-04-19 LAB — HEPARIN INDUCED PLATELET AB (HIT ANTIBODY): Heparin Induced Plt Ab: 0.14 OD (ref 0.000–0.400)

## 2020-04-19 LAB — PHOSPHORUS: Phosphorus: 6.3 mg/dL — ABNORMAL HIGH (ref 2.5–4.6)

## 2020-04-19 MED ORDER — GUAIFENESIN ER 600 MG PO TB12
1200.0000 mg | ORAL_TABLET | Freq: Two times a day (BID) | ORAL | Status: DC
  Administered 2020-04-19 – 2020-05-02 (×25): 1200 mg via ORAL
  Filled 2020-04-19 (×27): qty 2

## 2020-04-19 MED ORDER — ACETAMINOPHEN 325 MG PO TABS
650.0000 mg | ORAL_TABLET | Freq: Four times a day (QID) | ORAL | Status: DC | PRN
  Administered 2020-04-27: 650 mg via ORAL
  Filled 2020-04-19 (×2): qty 2

## 2020-04-19 MED ORDER — SODIUM CHLORIDE 0.9% IV SOLUTION: Freq: Once | INTRAVENOUS | Status: AC

## 2020-04-19 MED ORDER — FUROSEMIDE 10 MG/ML IJ SOLN
40.0000 mg | Freq: Once | INTRAMUSCULAR | Status: AC
  Administered 2020-04-19: 40 mg via INTRAVENOUS
  Filled 2020-04-19: qty 4

## 2020-04-19 MED ORDER — IPRATROPIUM BROMIDE 0.02 % IN SOLN
0.5000 mg | Freq: Four times a day (QID) | RESPIRATORY_TRACT | Status: DC
  Administered 2020-04-19: 0.5 mg via RESPIRATORY_TRACT
  Filled 2020-04-19: qty 2.5

## 2020-04-19 MED ORDER — LEVALBUTEROL HCL 0.63 MG/3ML IN NEBU
0.6300 mg | INHALATION_SOLUTION | Freq: Four times a day (QID) | RESPIRATORY_TRACT | Status: DC
  Administered 2020-04-19: 0.63 mg via RESPIRATORY_TRACT
  Filled 2020-04-19: qty 3

## 2020-04-19 NOTE — Progress Notes (Signed)
Spoke with Joellen Jersey, RN who will remove right PIV from arm with PICC line.

## 2020-04-19 NOTE — Progress Notes (Addendum)
PT requested Vest be removed after approx 4 mins of therapy- states she is in pain (RN aware). PT utilized Flutter with minimal effort. Attempted to work with PT on deep breathing and coughing with minimal effort.  Non productive cough at this time. Again explained to PT why we are attempting to perform these therapies- she states she is tired.

## 2020-04-19 NOTE — Plan of Care (Signed)

## 2020-04-19 NOTE — Progress Notes (Signed)
PT able to tolerate Vest for approximately 5 minutes and requested to stop. PT demonstrated hands on understanding of Flutter device. PT has non productive cough at this time.

## 2020-04-19 NOTE — Progress Notes (Signed)
PROGRESS NOTE    Michelle Avila  BSW:[id number] DOB: Nov 16, 1945 DOA: 04/12/2020 PCP: Venetia Maxon, Sharon Mt, MD  Brief Narrative:  The patient is a 74 year old chronically ill-appearing cachectic Caucasian female with a past medical history significant for hypothyroidism who presented with altered mental status, hypoglycemia, dehydration.  From report neighbors had not seen or heard from her in several days and daughter came in from out of state and found the patient on the floor in feces and was very altered.  EMS was dispatched and upon evaluation her blood sugar was 19 and so they gave her D10.  She was brought to the ED and found to be dehydrated and given 2-1/2 L of IV fluids.  CT head was obtained without acute changes.  Blood and urine cultures were ordered but the ED felt the picture was more consistent with dehydration and hypoglycemia rather than infection so she is not started on antibiotics at that time.  Her mentation largely improved in the ED and she endorsed a recent med change and was placed on a diuretic for her bilateral lower extremity edema.  Further work-up revealed that she ended up going into septic shock and hypovolemic shock from a gram-negative bacteremia likely from being in her feces and possible colitis.  She was started on IV Zosyn and changed to ciprofloxacin but I evaluated and she is on IM ceftriaxone.  She was placed on pressors and had encephalopathy and electrolyte derangements.  They were adjusted and corrected and she was given albumin x2.  She was stabilized in the ICU and had some dysphagia so she was n.p.o. and SLP evaluated and they are now recommending a dysphagia 2 diet with nectar thick liquids.  She is transferred to [facility] service 04/18/2020 and when I evaluated her today she is very frail and cachectic and likely has failure to thrive so we will consult palliative care and dietitian for further evaluation recommendations.  During her hospitalization she had  critical illness thrombocytopenia and was given 2 packs of platelets.  04/19/20: Palliative care was consulted and will see the patient today.  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  Platelet counts continue to drop and will be transfused 2 more units of platelets and I discussed the case with Dr. Alen Blew who feels that this is secondary to her septic shock and her bacteremia and recommend supportive care at this time and transfusions.  We will continue diuresis as tolerated and she will get Lasix twice today.  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 with likely possible concomitant pneumonia -In the ED she is found to be dehydrated and given 2.5 L of IV fluids -Daughter found the patient on the floor in feces and very altered and patient initially had a blood glucose of 19 -Urine culture showing less than 10,000 colony-forming units of insignificant growth -We will change IM ceftriaxone to IV levofloxacin for now; IV Zosyn has now been stopped -WBC is improving and trending down and has gone from 27.9 -> 24.2 ->  17.3 -> 11.2 10.9 and is repeated and 9.2 -Procalcitonin level went from 4.81 and trended down to 2.86  -Lactic Acid level went from 6.0 and then trended down to 1.6 -She is now off of pressors -IV fluid was resumed at  75 mL's per hour with D5 and lactated Ringer's and will discontinue now given her hypoalbuminemia and likely volume overload. -Changed ceftriaxone to IV Levofloxacin for 14 Days -We will obtain PT and OT to further evaluate and treat and have palliative care discuss goals of care given her significant failure to thrive -PT OT evaluation still pending  Acute Encephalopathy -Likely multifactorial including her severe hypoglycemia on admission,  dehydration, sepsis from her colitis and bacteremia -Initial head and repeat CT done had no acute changes  -She had ICU delirium and was hyperactive and hypoactive but the critical care physician feels that the delirium may be a sign that she may be actively dying -Delirium precautions initiated -Palliative care consulted for further goals of care discussion and patient was somnolent and sleepy  Volume overload in the setting of volume resuscitation as well as acute on chronic diastolic CHF -In the setting of her poor nutritional status and possible liver dysfunction -She appeared dry yesterday and so she was initiated on D5 in LR but to me she appears extremely volume overloaded so I stopped her fluid hydration -Now that she has been placed on a diet will stop IV fluid hydration and continue to monitor closely -We will give her IV Lasix and give IV Lasix this morning and again this afternoon  -Echocardiogram showed a normal EF but grade 2 diastolic dysfunction -BNP was elevated at 1113.0 -Strict I's and O's and daily weights -Patient is +23 pounds since admission weight and is 14.551 L positive -Continue with diuresis as tolerated given her blood pressure -We will reconsult pulmonary and continue diuresis and consider cardiology consultation  Acute Respiratory Failure with Hypoxia -The setting of above next-patient also has been aspirating likely -Continue Levaquin and diuresis -Chest x-ray done and showed "Extensive airspace disease with asymmetry favoring infection over edema. There are pleural effusions, greater on the left, and there may be superimposed failure." -Continue with IV Levaquin for infection next- -we will add pulmonary toileting, guaifenesin 1200 mg p.o. twice daily, flutter valve, incentive spirometer, Xopenex and Atrovent breathing treatments -She is on 4 L supplemental oxygen via nasal cannula -SpO2: 100 % O2 Flow Rate (L/min): 2 L/min -Continuous pulse oximetry  maintain O2 saturation greater than 90% -Continue supplemental oxygen via nasal cannula wean as tolerated next-we will need ambulatory home O2 screen prior to discharge -Reconsulted pulmonary for further evaluation recommendations -Repeat chest x-ray in the morning  Dysphagia -Patient has moderately severe oral pharyngeal dysphagia; today SLP evaluated and recommended n.p.o. 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  Lower extremity bilateral edema -Echocardiogram done and showed grade 2 diastolic dysfunction with  a normal EF; will check a BNP -We will extremity venous duplexes done and showed a chronic right lower extremity DVT with no role for anticoagulation at this time given her severe thrombocytopenia -We will try diuresis given her significant volume overload -We will continue to monitor  Severe Protein Calorie Malnutrition/Failure to Thrive -Now placed on a dysphagia 2 diet -We will consult nutritionist for further evaluation recommendations  Hyperbilirubinemia -Patient's T bili went from 0.5  on admission and has trended up to 1.5 and her Direct Bilirubin was 0.5 and indirect was 1.0 yesterday; now T bili is 1.0 -Liver Doppler done and as below -Continue monitor and trend and repeat CMP in the a.m.   Hypothyroidism -Patient's TSH was 9.921 and free T4 0.67 -Her levothyroxine has been resumed at 112 mcg   Abnormal LFTs  -Still somewhat elevated but trending down from earlier on admission -patient's AST is 67 and improved to 52 and ALT is now 65 and the same as yesterday -Liver Doppler done on 10.18 showed "Widely patent hepatic vasculature with normal directional flow. Nonspecific atrophy of the left lobe of the liver as demonstrated on preceding abdominal CT. No discrete hepatic lesions. Trace amount of perihepatic ascites and small/trace bilateral effusions as was demonstrated on preceding abdominal CT. Cholelithiasis without evidence of cholecystitis." -Continue monitor and trend and repeat CMP in a.m.  Thrombocytopenia -Patient's Platelet Count dropped to 5 and now slowly trending up as it has gone to 30 -> 11 -> 49 and trended back down to 15 and then 10 today.  Will transfuse 2 more packs of platelets -In the Setting of Septic Shock and her gram-negative bacteremia and I spoke with oncology who feels that this is secondary to her infection and not related to HUS TTP or ITP -She is transfused 2 packs of platelets  -There is low suspicion for HIT given the timing but Lovenox has been discontinued -Continue to Monitor for S/Sx of Bleeding; Currently no overt bleeding noted -Repeat CBC in the AM  Hypokalemia -Patient's K+ was 3.4 yesterday and this morning is 4.3 -Replete with IV KCl 30 mEQ given that she was n.p.o. this morning -Check Mag Level but was 2.2 -Continue to Monitor and Replete as Necessary -Repeat CMP  DVT prophylaxis: SCDs given her significant thrombocytopenia prohibiting pharmacological prophylaxis Code Status: DO NOT RESUSCITATE Family Communication: Spoke  with the daughter at bedside Disposition Plan: Pending further clinical improvement back to baseline and tolerance of p.o. diet; she will need PT OT to further evaluate and treat  Status is: Inpatient  Remains inpatient appropriate because:Unsafe d/c plan, IV treatments appropriate due to intensity of illness or inability to take PO and Inpatient level of care appropriate due to severity of illness   Dispo: The patient is from: Home              Anticipated d/c is to: TBD              Anticipated d/c date is: 2 days              Patient currently is not medically stable to d/c.  Consultants:   PCCM Transfer  Palliative care medicine  Significant Diagnostic Tests:  CT H 10/17> chronic microvascular ischemic changes, mild atrophy related to age.  Micro Data:  10/17 SARS Cov2> neg 10/17 BCx> 1 of 2 staph species, contaminant, 2 of 2 GNRs 10/17 UCx> <10k CFU 10/17: MRSA screen negative Antimicrobials:  10/17 zosyn >10/18 10/18 cipro (GNR resistant to  all but cipro, imipenem)>>  Echocardiogram IMPRESSIONS    1. Left ventricular ejection fraction, by estimation, is 55 to 60%. The  left ventricle has normal function. Left ventricular endocardial border  not optimally defined to evaluate regional wall motion. Left ventricular  diastolic parameters are consistent  with Grade II diastolic dysfunction (pseudonormalization). Elevated left  ventricular end-diastolic pressure.  2. Right ventricular systolic function is normal. The right ventricular  size is normal. There is normal pulmonary artery systolic pressure. The  estimated right ventricular systolic pressure is 94.8 mmHg.  3. The mitral valve is grossly normal. No evidence of mitral valve  regurgitation. No evidence of mitral stenosis.  4. The aortic valve is abnormal. There is moderate calcification of the  aortic valve. Aortic valve regurgitation is not visualized. Moderate  aortic valve sclerosis/calcification is  present, without any evidence of  aortic stenosis.  5. The inferior vena cava is normal in size with <50% respiratory  variability, suggesting right atrial pressure of 8 mmHg.   FINDINGS  Left Ventricle: Left ventricular ejection fraction, by estimation, is 55  to 60%. The left ventricle has normal function. Left ventricular  endocardial border not optimally defined to evaluate regional wall motion.  The left ventricular internal cavity  size was normal in size. There is no left ventricular hypertrophy. Left  ventricular diastolic parameters are consistent with Grade II diastolic  dysfunction (pseudonormalization). Elevated left ventricular end-diastolic  pressure.   Right Ventricle: The right ventricular size is normal. No increase in  right ventricular wall thickness. Right ventricular systolic function is  normal. There is normal pulmonary artery systolic pressure. The tricuspid  regurgitant velocity is 2.36 m/s, and  with an assumed right atrial pressure of 8 mmHg, the estimated right  ventricular systolic pressure is 54.6 mmHg.   Left Atrium: Left atrial size was normal in size.   Right Atrium: Right atrial size was normal in size.   Pericardium: There is no evidence of pericardial effusion.   Mitral Valve: Mild chordal SAM. The mitral valve is grossly normal. Mild  mitral annular calcification. No evidence of mitral valve regurgitation.  No evidence of mitral valve stenosis.   Tricuspid Valve: The tricuspid valve is normal in structure. Tricuspid  valve regurgitation is mild . No evidence of tricuspid stenosis.   Aortic Valve: The aortic valve is abnormal. There is moderate  calcification of the aortic valve. Aortic valve regurgitation is not  visualized. Mild to moderate aortic valve sclerosis/calcification is  present, without any evidence of aortic stenosis.   Pulmonic Valve: The pulmonic valve was grossly normal. Pulmonic valve  regurgitation is trivial. No  evidence of pulmonic stenosis.   Aorta: The aortic root is normal in size and structure.   Venous: The inferior vena cava is normal in size with less than 50%  respiratory variability, suggesting right atrial pressure of 8 mmHg.   IAS/Shunts: No atrial level shunt detected by color flow Doppler.   Additional Comments: A venous catheter is visualized in the right atrium.     LEFT VENTRICLE  PLAX 2D  LVIDd:     4.20 cm Diastology  LVIDs:     2.50 cm LV e' medial:  3.68 cm/s  LV PW:     1.00 cm LV E/e' medial: 19.0  LV IVS:    0.80 cm LV e' lateral:  6.13 cm/s  LVOT diam:   2.00 cm LV E/e' lateral: 11.4  LV SV:     54  LV SV Index:  35  LVOT Area:   3.14 cm     RIGHT VENTRICLE  RV S prime:   11.20 cm/s  TAPSE (M-mode): 1.5 cm   LEFT ATRIUM       Index    RIGHT ATRIUM      Index  LA diam:    2.90 cm 1.88 cm/m RA Area:   15.00 cm  LA Vol (A2C):  50.7 ml 32.79 ml/m RA Volume:  34.20 ml 22.12 ml/m  LA Vol (A4C):  25.2 ml 16.30 ml/m  LA Biplane Vol: 39.6 ml 25.61 ml/m  AORTIC VALVE  LVOT Vmax:  90.50 cm/s  LVOT Vmean: 66.250 cm/s  LVOT VTI:  0.172 m    AORTA  Ao Root diam: 3.00 cm  Ao Asc diam: 2.70 cm   MITRAL VALVE        TRICUSPID VALVE  MV Area (PHT): 3.12 cm  TR Peak grad:  22.3 mmHg  MV Decel Time: 243 msec  TR Vmax:    236.00 cm/s  MV E velocity: 69.80 cm/s  MV A velocity: 69.00 cm/s SHUNTS  MV E/A ratio: 1.01    Systemic VTI: 0.17 m               Systemic Diam: 2.00 cm   LE VENOUS DUPLEX Summary:  RIGHT:  - Findings consistent with chronic deep vein thrombosis involving the  visualized segments of the right peroneal veins.  - A cystic structure is found in the popliteal fossa.    LEFT:  - There is no evidence of deep vein thrombosis in the lower extremity.  However, portions of this examination were limited- see technologist  comments above.     *See table(s) above for measurements and observations.    Antimicrobials:  Anti-infectives (From admission, onward)   Start     Dose/Rate Route Frequency Ordered Stop   04/18/20 1600  levofloxacin (LEVAQUIN) IVPB 750 mg        750 mg 100 mL/hr over 90 Minutes Intravenous Every 48 hours 04/18/20 0951     04/17/20 2230  cefTRIAXone (ROCEPHIN) injection 1 g  Status:  Discontinued        1 g Intramuscular Every 24 hours 04/17/20 2144 04/18/20 0949   04/17/20 1400  levofloxacin (LEVAQUIN) IVPB 500 mg  Status:  Discontinued        500 mg 100 mL/hr over 60 Minutes Intravenous Every 24 hours 04/17/20 1157 04/17/20 2144   04/14/20 1600  ciprofloxacin (CIPRO) IVPB 400 mg  Status:  Discontinued        400 mg 200 mL/hr over 60 Minutes Intravenous Every 24 hours 04/14/20 1443 04/17/20 1157   04/13/20 2000  vancomycin (VANCOCIN) IVPB 1000 mg/200 mL premix        1,000 mg 200 mL/hr over 60 Minutes Intravenous  Once 04/13/20 1844 04/13/20 2111   04/13/20 1850  vancomycin variable dose per unstable renal function (pharmacist dosing)  Status:  Discontinued         Does not apply See admin instructions 04/13/20 1851 04/14/20 1003   04/13/20 1200  piperacillin-tazobactam (ZOSYN) IVPB 2.25 g  Status:  Discontinued        2.25 g 100 mL/hr over 30 Minutes Intravenous Every 8 hours 04/13/20 0639 04/14/20 1442   04/13/20 0230  piperacillin-tazobactam (ZOSYN) IVPB 3.375 g       Note to Pharmacy: Pharmacy to dose   3.375 g 100 mL/hr over 30 Minutes Intravenous  Once 04/13/20 0216 04/13/20 0342  Subjective: Seen and examined at bedside and was resting and slightly difficult to arouse.  She complained of no complaints and super head yes or no but did not really communicate that she did yesterday.  No family currently at bedside.  Appears extremely weak and deconditioned.  Legs are wrapped and weeping still.  Appears volume overloaded so we will give her some diuresis today.  Foley catheter in place.   No other concerns or complaints at this time.  Objective: Vitals:   04/18/20 1950 04/19/20 0438 04/19/20 0500 04/19/20 1347  BP: (!) 149/69 (!) 110/59  111/61  Pulse: 96 93  90  Resp:  20  18  Temp: (!) 97 F (36.1 C) (!) 97.3 F (36.3 C)    TempSrc: Rectal Oral    SpO2: 93% 95%  100%  Weight:   56.2 kg   Height:        Intake/Output Summary (Last 24 hours) at 04/19/2020 1458 Last data filed at 04/19/2020 1300 Gross per 24 hour  Intake 701.7 ml  Output 350 ml  Net 351.7 ml   Filed Weights   04/16/20 0349 04/18/20 0659 04/19/20 0500  Weight: 57.5 kg 55.3 kg 56.2 kg   Examination: Physical Exam:  Constitutional: The patient is a very thin frail cachectic chronically ill-appearing Caucasian female currently in no acute distress and she is resting but does appear a little uncomfortable on the way she is laying Eyes: Lids and conjunctivae normal, sclerae anicteric  ENMT: External Ears, Nose appear normal. Grossly normal hearing.  Neck: Appears normal, supple, no cervical masses, normal ROM, no appreciable thyromegaly no appreciable JVD Respiratory: Diminished to auscultation bilaterally with coarse breath sounds worse on the left compared to the right with some rhonchi and crackles.  She has unlabored breathing but is wearing 4 L of supplemental oxygen via nasal cannula  cardiovascular: RRR, no murmurs / rubs / gallops. S1 and S2 auscultated.  Has 1-2+ lower extremity edema and is weeping Abdomen: Soft, non-tender, non-distended.  Bowel sounds positive.  GU: Deferred.  Foley catheter is in place Musculoskeletal: No clubbing / cyanosis of digits/nails. No joint deformity upper and lower extremities.  Skin: Has some bruising and ecchymosis and lower extremities are weeping. No induration; Warm and dry.  Neurologic: CN 2-12 grossly intact with no focal deficits. Romberg sign cerebellar reflexes not assessed.  Psychiatric: Impaired judgment and insight.  She is somnolent and drowsy  today and not as alert and oriented. Normal mood and appropriate affect.   Data Reviewed: I have personally reviewed following labs and imaging studies  CBC: Recent Labs  Lab 04/12/20 2349 04/13/20 0310 04/14/20 2057 04/15/20 0529 04/16/20 1120 04/17/20 0614 04/18/20 0439 04/19/20 0430 04/19/20 1330  WBC 9.5   < > 27.4*   < > 24.2* 17.3* 11.2* 7.9 9.2  NEUTROABS 8.3*  --  26.0*  --  22.0*  --   --  6.4 7.5  HGB 9.4*   < > 6.1*   < > 8.8* 9.0* 7.3* 8.2* 7.9*  HCT 33.3*   < > 18.8*   < > 27.2* 28.6* 23.3* 27.4* 26.0*  MCV 80.4   < > 75.2*   < > 78.4* 80.6 80.1 86.2 85.0  PLT 137*   < > 33*   < > 30* 11* 49* 15* 10*   < > = values in this interval not displayed.   Basic Metabolic Panel: Recent Labs  Lab 04/13/20 0655 04/13/20 0539 04/15/20 0529 04/16/20 0310 04/17/20 7673 04/18/20  3009 04/19/20 0430  NA 135   < > 132* 135 136 139 142  K 3.0*   < > 3.7 3.4* 3.4* 3.4* 4.3  CL 108   < > 97* 100 99 101 102  CO2 11*   < > 24 22 25 27 27   GLUCOSE 72   < > 102* 81 90 119* 100*  BUN 45*   < > 40* 42* 44* 46* 40*  CREATININE 2.41*   < > 1.98* 1.96* 1.99* 2.01* 1.83*  CALCIUM 6.9*   < > 6.2* 6.7* 7.3* 7.2* 7.5*  MG 1.6*  --   --   --   --  2.2 2.2  PHOS 5.4*  --   --   --   --   --  6.3*   < > = values in this interval not displayed.   GFR: Estimated Creatinine Clearance: 23.6 mL/min (A) (by C-G formula based on SCr of 1.83 mg/dL (H)). Liver Function Tests: Recent Labs  Lab 04/12/20 2349 04/13/20 0655 04/14/20 2057 04/18/20 0439 04/19/20 0430  AST 46* 65* 145* 67* 52*  ALT 26 28 49* 65* 65*  ALKPHOS 178* 142* 126 283* 270*  BILITOT 0.6 0.5 1.2 1.5* 1.0  PROT 4.8* 4.1* 5.0* 4.5* 4.8*  ALBUMIN 1.3* 1.2* 3.0* 1.9* 1.9*   No results for input(s): LIPASE, AMYLASE in the last 168 hours. Recent Labs  Lab 04/13/20 0043  AMMONIA 51*   Coagulation Profile: Recent Labs  Lab 04/14/20 0845 04/14/20 2316  INR 1.6* 1.6*   Cardiac Enzymes: Recent Labs  Lab  04/13/20 0205  CKTOTAL 392*   BNP (last 3 results) No results for input(s): PROBNP in the last 8760 hours. HbA1C: No results for input(s): HGBA1C in the last 72 hours. CBG: Recent Labs  Lab 04/18/20 1946 04/19/20 0005 04/19/20 0406 04/19/20 0733 04/19/20 1149  GLUCAP 132* 122* 99 92 83   Lipid Profile: No results for input(s): CHOL, HDL, LDLCALC, TRIG, CHOLHDL, LDLDIRECT in the last 72 hours. Thyroid Function Tests: No results for input(s): TSH, T4TOTAL, FREET4, T3FREE, THYROIDAB in the last 72 hours. Anemia Panel: No results for input(s): VITAMINB12, FOLATE, FERRITIN, TIBC, IRON, RETICCTPCT in the last 72 hours. Sepsis Labs: Recent Labs  Lab 04/13/20 0150 04/13/20 0310 04/13/20 0655 04/13/20 1940 04/14/20 0249 04/14/20 2057 04/14/20 2316 04/15/20 0529  PROCALCITON 4.81  --   --   --  3.84  --   --  2.86  LATICACIDVEN  --    < > 6.0* 6.0*  --  2.0* 1.6  --    < > = values in this interval not displayed.    Recent Results (from the past 240 hour(s))  Culture, blood (routine x 2)     Status: Abnormal   Collection Time: 04/12/20 11:45 PM   Specimen: BLOOD RIGHT ARM  Result Value Ref Range Status   Specimen Description BLOOD RIGHT ARM  Final   Special Requests   Final    BOTTLES DRAWN AEROBIC AND ANAEROBIC Blood Culture adequate volume   Culture  Setup Time   Final    GRAM NEGATIVE RODS GRAM POSITIVE COCCI IN CLUSTERS IN BOTH AEROBIC AND ANAEROBIC BOTTLES CRITICAL RESULT CALLED TO, READ BACK BY AND VERIFIED WITH: PHARMD E WILLIAMSON [id number] [place] 1757 BY CM Performed at [facility], [place] [address], [place], [place] [address]    Culture (A)  Final    FLAVOBACTERIUM ODORATUM AEROCOCCUS VIRIDANS STAPHYLOCOCCUS XYLOSUS    Report Status 04/15/2020 FINAL  Final  Blood Culture ID Panel (Reflexed)     Status: Abnormal   Collection Time: 04/12/20 11:45 PM  Result Value Ref Range Status   Enterococcus faecalis NOT DETECTED NOT DETECTED Final   Enterococcus  Faecium NOT DETECTED NOT DETECTED Final   Listeria monocytogenes NOT DETECTED NOT DETECTED Final   Staphylococcus species DETECTED (A) NOT DETECTED Final    Comment: CRITICAL RESULT CALLED TO, READ BACK BY AND VERIFIED WITH: PHARMD E WILLIAMSON [id number] AT 1757 BY CM    Staphylococcus aureus (BCID) NOT DETECTED NOT DETECTED Final   Staphylococcus epidermidis NOT DETECTED NOT DETECTED Final   Staphylococcus lugdunensis NOT DETECTED NOT DETECTED Final   Streptococcus species NOT DETECTED NOT DETECTED Final   Streptococcus agalactiae NOT DETECTED NOT DETECTED Final   Streptococcus pneumoniae NOT DETECTED NOT DETECTED Final   Streptococcus pyogenes NOT DETECTED NOT DETECTED Final   A.calcoaceticus-baumannii NOT DETECTED NOT DETECTED Final   Bacteroides fragilis NOT DETECTED NOT DETECTED Final   Enterobacterales NOT DETECTED NOT DETECTED Final   Enterobacter cloacae complex NOT DETECTED NOT DETECTED Final   Escherichia coli NOT DETECTED NOT DETECTED Final   Klebsiella aerogenes NOT DETECTED NOT DETECTED Final   Klebsiella oxytoca NOT DETECTED NOT DETECTED Final   Klebsiella pneumoniae NOT DETECTED NOT DETECTED Final   Proteus species NOT DETECTED NOT DETECTED Final   Salmonella species NOT DETECTED NOT DETECTED Final   Serratia marcescens NOT DETECTED NOT DETECTED Final   Haemophilus influenzae NOT DETECTED NOT DETECTED Final   Neisseria meningitidis NOT DETECTED NOT DETECTED Final   Pseudomonas aeruginosa NOT DETECTED NOT DETECTED Final   Stenotrophomonas maltophilia NOT DETECTED NOT DETECTED Final   Candida albicans NOT DETECTED NOT DETECTED Final   Candida auris NOT DETECTED NOT DETECTED Final   Candida glabrata NOT DETECTED NOT DETECTED Final   Candida krusei NOT DETECTED NOT DETECTED Final   Candida parapsilosis NOT DETECTED NOT DETECTED Final   Candida tropicalis NOT DETECTED NOT DETECTED Final   Cryptococcus neoformans/gattii NOT DETECTED NOT DETECTED Final    Comment: Performed at  [facility], [address] [place], [place] [address]  Respiratory Panel by RT PCR (Flu A&B, Covid) - Nasopharyngeal Swab     Status: None   Collection Time: 04/13/20 12:13 AM   Specimen: Nasopharyngeal Swab  Result Value Ref Range Status   SARS Coronavirus 2 by RT PCR NEGATIVE NEGATIVE Final    Comment: (NOTE) SARS-CoV-2 target nucleic acids are NOT DETECTED.  The SARS-CoV-2 RNA is generally detectable in upper respiratoy specimens during the acute phase of infection. The lowest concentration of SARS-CoV-2 viral copies this assay can detect is 131 copies/mL. A negative result does not preclude SARS-Cov-2 infection and should not be used as the sole basis for treatment or other patient management decisions. A negative result may occur with  improper specimen collection/handling, submission of specimen other than nasopharyngeal swab, presence of viral mutation(s) within the areas targeted by this assay, and inadequate number of viral copies (<131 copies/mL). A negative result must be combined with clinical observations, patient history, and epidemiological information. The expected result is Negative.  Fact Sheet for Patients:  PinkCheek.be  Fact Sheet for Healthcare Providers:  GravelBags.it  This test is no t yet approved or cleared by the [place] FDA and  has been authorized for detection and/or diagnosis of SARS-CoV-2 by FDA under an Emergency Use Authorization (EUA). This EUA will remain  in effect (meaning this test can be used) for the duration of  the COVID-19 declaration under Section 564(b)(1) of the Act, 21 U.S.C. section 360bbb-3(b)(1), unless the authorization is terminated or revoked sooner.     Influenza A by PCR NEGATIVE NEGATIVE Final   Influenza B by PCR NEGATIVE NEGATIVE Final    Comment: (NOTE) The Xpert Xpress SARS-CoV-2/FLU/RSV assay is intended as an aid in  the diagnosis of influenza  from Nasopharyngeal swab specimens and  should not be used as a sole basis for treatment. Nasal washings and  aspirates are unacceptable for Xpert Xpress SARS-CoV-2/FLU/RSV  testing.  Fact Sheet for Patients: PinkCheek.be  Fact Sheet for Healthcare Providers: GravelBags.it  This test is not yet approved or cleared by the [place] FDA and  has been authorized for detection and/or diagnosis of SARS-CoV-2 by  FDA under an Emergency Use Authorization (EUA). This EUA will remain  in effect (meaning this test can be used) for the duration of the  Covid-19 declaration under Section 564(b)(1) of the Act, 21  U.S.C. section 360bbb-3(b)(1), unless the authorization is  terminated or revoked. Performed at [facility], [place] [address] [place], [place] [address]   Culture, blood (routine x 2)     Status: Abnormal   Collection Time: 04/13/20  1:18 AM   Specimen: BLOOD RIGHT HAND  Result Value Ref Range Status   Specimen Description BLOOD RIGHT HAND  Final   Special Requests   Final    BOTTLES DRAWN AEROBIC ONLY Blood Culture adequate volume   Culture  Setup Time   Final    GRAM NEGATIVE RODS AEROBIC BOTTLE ONLY CRITICAL RESULT CALLED TO, READ BACK BY AND VERIFIED WITH: PHARMD S CHRISTY [id number] AT 1820 BY CM Performed at [facility], [place] [address] [place], [place] [address]    Culture FLAVOBACTERIUM ODORATUM (A)  Final   Report Status 04/15/2020 FINAL  Final   Organism ID, Bacteria FLAVOBACTERIUM ODORATUM  Final      Susceptibility   Flavobacterium odoratum - MIC*    CEFAZOLIN >=64 RESISTANT Resistant     CEFTAZIDIME >=64 RESISTANT Resistant     CIPROFLOXACIN 1 SENSITIVE Sensitive     GENTAMICIN >=16 RESISTANT Resistant     IMIPENEM 4 SENSITIVE Sensitive     TRIMETH/SULFA >=320 RESISTANT Resistant     PIP/TAZO 32 INTERMEDIATE Intermediate     * FLAVOBACTERIUM ODORATUM  Urine culture     Status: Abnormal    Collection Time: 04/13/20  6:18 AM   Specimen: Urine, Catheterized  Result Value Ref Range Status   Specimen Description URINE, CATHETERIZED  Final   Special Requests NONE  Final   Culture (A)  Final    <10,000 COLONIES/mL INSIGNIFICANT GROWTH Performed at [facility], [place] [address], [place],  [address]    Report Status 04/14/2020 FINAL  Final  MRSA PCR Screening     Status: None   Collection Time: 04/13/20 10:33 AM   Specimen: Nasopharyngeal  Result Value Ref Range Status   MRSA by PCR NEGATIVE NEGATIVE Final    Comment:        The GeneXpert MRSA Assay (FDA approved for NASAL specimens only), is one component of a comprehensive MRSA colonization surveillance program. It is not intended to diagnose MRSA infection nor to guide or monitor treatment for MRSA infections. Performed at [facility], [place] [address] [place],  [address]   Gastrointestinal Panel by PCR , Stool     Status: None   Collection Time: 04/13/20  2:36 PM  Specimen: Stool  Result Value Ref Range Status   Campylobacter species NOT DETECTED NOT DETECTED Final   Plesimonas shigelloides NOT DETECTED NOT DETECTED Final   Salmonella species NOT DETECTED NOT DETECTED Final   Yersinia enterocolitica NOT DETECTED NOT DETECTED Final   Vibrio species NOT DETECTED NOT DETECTED Final   Vibrio cholerae NOT DETECTED NOT DETECTED Final   Enteroaggregative E coli (EAEC) NOT DETECTED NOT DETECTED Final   Enteropathogenic E coli (EPEC) NOT DETECTED NOT DETECTED Final   Enterotoxigenic E coli (ETEC) NOT DETECTED NOT DETECTED Final   Shiga like toxin producing E coli (STEC) NOT DETECTED NOT DETECTED Final   Shigella/Enteroinvasive E coli (EIEC) NOT DETECTED NOT DETECTED Final   Cryptosporidium NOT DETECTED NOT DETECTED Final   Cyclospora cayetanensis NOT DETECTED NOT DETECTED Final   Entamoeba histolytica NOT DETECTED NOT DETECTED Final   Giardia lamblia NOT DETECTED NOT DETECTED Final    Adenovirus F40/41 NOT DETECTED NOT DETECTED Final   Astrovirus NOT DETECTED NOT DETECTED Final   Norovirus GI/GII NOT DETECTED NOT DETECTED Final   Rotavirus A NOT DETECTED NOT DETECTED Final   Sapovirus (I, II, IV, and V) NOT DETECTED NOT DETECTED Final    Comment: Performed at [facility], [address] [place], [place] [address]  C Difficile Quick Screen w PCR reflex     Status: None   Collection Time: 04/14/20 11:40 AM   Specimen: Stool  Result Value Ref Range Status   C Diff antigen NEGATIVE NEGATIVE Final   C Diff toxin NEGATIVE NEGATIVE Final   C Diff interpretation No C. difficile detected.  Final    Comment: Performed at [facility], [place] [address], [place],  [address]     RN Pressure Injury Documentation: Pressure Injury 04/13/20 Heel Right Deep Tissue Pressure Injury - Purple or maroon localized area of discolored intact skin or blood-filled blister due to damage of underlying soft tissue from pressure and/or shear. spanning entire sole of foot (Active)  04/13/20 1030  Location: Heel  Location Orientation: Right  Staging: Deep Tissue Pressure Injury - Purple or maroon localized area of discolored intact skin or blood-filled blister due to damage of underlying soft tissue from pressure and/or shear.  Wound Description (Comments): spanning entire sole of foot  Present on Admission: Yes     Pressure Injury 04/13/20 Heel Left Deep Tissue Pressure Injury - Purple or maroon localized area of discolored intact skin or blood-filled blister due to damage of underlying soft tissue from pressure and/or shear. (Active)  04/13/20 1030  Location: Heel  Location Orientation: Left  Staging: Deep Tissue Pressure Injury - Purple or maroon localized area of discolored intact skin or blood-filled blister due to damage of underlying soft tissue from pressure and/or shear.  Wound Description (Comments):   Present on Admission: Yes     Pressure Injury  04/13/20 Sacrum Stage 3 -  Full thickness tissue loss. Subcutaneous fat may be visible but bone, tendon or muscle are NOT exposed. deep tissue injury surrounding stg 3 (Active)  04/13/20 1030  Location: Sacrum  Location Orientation:   Staging: Stage 3 -  Full thickness tissue loss. Subcutaneous fat may be visible but bone, tendon or muscle are NOT exposed.  Wound Description (Comments): deep tissue injury surrounding stg 3  Present on Admission: Yes     Pressure Injury 04/13/20 Knee Anterior;Left Deep Tissue Pressure Injury - Purple or maroon localized area of discolored intact skin or blood-filled blister due to damage of underlying  soft tissue from pressure and/or shear. (Active)  04/13/20 1030  Location: Knee  Location Orientation: Anterior;Left  Staging: Deep Tissue Pressure Injury - Purple or maroon localized area of discolored intact skin or blood-filled blister due to damage of underlying soft tissue from pressure and/or shear.  Wound Description (Comments):   Present on Admission: Yes    Estimated body mass index is 20.6 kg/m as calculated from the following:   Height as of this encounter: 5\' 5"  (1.651 m).   Weight as of this encounter: 56.2 kg.  Malnutrition Type:    Malnutrition Characteristics:     Nutrition Interventions:    Radiology Studies: DG CHEST PORT 1 VIEW  Result Date: 04/19/2020 CLINICAL DATA:  Dyspnea EXAM: PORTABLE CHEST 1 VIEW COMPARISON:  04/12/2020 FINDINGS: Extensive airspace opacity, confluent on the left where there is nearly no aerated lung. Pleural effusions on the left more than right. Largely obscured heart size which is likely enlarged. Right-sided PICC with tip at the upper right atrium. No pneumothorax IMPRESSION: Extensive airspace disease with asymmetry favoring infection over edema. There are pleural effusions, greater on the left, and there may be superimposed failure. Electronically Signed   By: Monte Fantasia M.D.   On: 04/19/2020 06:59    DG Swallowing Func-Speech Pathology  Result Date: 04/18/2020 Objective Swallowing Evaluation: Type of Study: Bedside Swallow Evaluation  Patient Details Name: Michelle Avila MRN: [id number] Date of Birth: 30-Sep-1945 Today's Date: 04/18/2020 Time: SLP Start Time (ACUTE ONLY): 1225 -SLP Stop Time (ACUTE ONLY): 1255 SLP Time Calculation (min) (ACUTE ONLY): 30 min Past Medical History: No past medical history on file. Past Surgical History: The histories are not reviewed yet. Please review them in the "History" navigator section and refresh this Trent. HPI: Pt presented with AMS, was found down covered in feces at home by her neighbors.  PMH for smoking, Pneumonitis due to inhalation of food and vomit in 2016, Pressure ulcer of sacral region, stage 1, Acute respiratory failure with hypoxia, protein-calorie malnutrition, bacterial pneumonia, was living alone, Diarrhea, Fluid overload, Altered mental status, Klebsiella pneumoniae (k. pneumoniae), Pure hypercholesterolemia, Essential (primary) hypertension, Hypotension, Tobacco use, bilirubin metabolism, Escherichia coli (E. coli), , Hypomagnesemia, Hypokalemia, Anemia, unspecified, Body mass index (BMI) 19.9 or less, adult malaise.  Subjective: pt awake in bed Assessment / Plan / Recommendation CHL IP CLINICAL IMPRESSIONS 04/18/2020 Clinical Impression Pt's positioning is poor as she has a difficulty time even holding up her head.   Pt presents with moderately severe oropharyngeal dysphagia c/b decreased oral coordination, propulsion, with premature spillage of barium into pharynx.  Pharyngeal swallow timing was impaired resulting in decreased laryngeal closure - allowing mild laryngeal penetration of nectar and thin liquids mixed with secretions and aspiration of thin.  Aspiration resulted in reflexive cough response that did not clear aspirates.  Cues for pt to clear her throat did not clear penetrates nor did a "hock" facilitate expectoration.  Chin  tuck posture *which is pt's natural position* did not prevent penetration but diminished it with nectar to trace.   Pt also appears with prominent cricopharyngeus which traps some thin barium -causing SLP to question if pt may have primary esophageal dysphagia with secondary pharyngeal deficits.   Upon esophageal sweep, pt appeared with retention that she did not sense.  With more barium provided (pudding thick), retention appeared to worsen.  Pt then coughed which resulted in appearance of significant backflow/retrograde propulsion to the mid-esophagus without pt awareness.  Suspect primary aspiration occurs  from esophageal dysphagia but  pt's oropharyngeal dysphagia factors into risk as well.   To mitigate aspiration risk, a modified diet may be helpful.  Would recommend pt have a palliative consult given her h/o asp pneumonitis, COPD and FTT.  Pt stated during MBS "I want to go home".  Due to overt aspiration of thin, recommend dys2/nectar diet and allow tsps of thin. SLP Visit Diagnosis Dysphagia, pharyngoesophageal phase (R13.14);Dysphagia, oropharyngeal phase (R13.12) Attention and concentration deficit following -- Frontal lobe and executive function deficit following -- Impact on safety and function Moderate aspiration risk   CHL IP TREATMENT RECOMMENDATION 04/18/2020 Treatment Recommendations Therapy as outlined in treatment plan below   Prognosis 04/18/2020 Prognosis for Safe Diet Advancement Guarded Barriers to Reach Goals Time post onset;Severity of deficits Barriers/Prognosis Comment -- CHL IP DIET RECOMMENDATION 04/18/2020 SLP Diet Recommendations Dysphagia 2 (Fine chop) solids;Nectar thick liquid Liquid Administration via Straw Medication Administration Whole meds with puree Compensations Slow rate;Small sips/bites;Chin tuck;Use straw to facilitate chin tuck Postural Changes Remain semi-upright after after feeds/meals (Comment);Seated upright at 90 degrees   CHL IP OTHER RECOMMENDATIONS 04/18/2020  Recommended Consults -- Oral Care Recommendations Oral care QID Other Recommendations Order thickener from pharmacy;Have oral suction available;Clarify dietary restrictions   CHL IP FOLLOW UP RECOMMENDATIONS 04/18/2020 Follow up Recommendations Skilled Nursing facility   [facility] IP FREQUENCY AND DURATION 04/18/2020 Speech Therapy Frequency (ACUTE ONLY) min 2x/week Treatment Duration 2 weeks      CHL IP ORAL PHASE 04/18/2020 Oral Phase Impaired Oral - Pudding Teaspoon -- Oral - Pudding Cup -- Oral - Honey Teaspoon -- Oral - Honey Cup -- Oral - Nectar Teaspoon Premature spillage Oral - Nectar Cup Premature spillage Oral - Nectar Straw Premature spillage Oral - Thin Teaspoon Decreased bolus cohesion;Premature spillage Oral - Thin Cup Premature spillage Oral - Thin Straw Premature spillage Oral - Puree Delayed oral transit Oral - Mech Soft Impaired mastication;Lingual/palatal residue;Delayed oral transit Oral - Regular -- Oral - Multi-Consistency -- Oral - Pill -- Oral Phase - Comment --  CHL IP PHARYNGEAL PHASE 04/18/2020 Pharyngeal Phase Impaired Pharyngeal- Pudding Teaspoon -- Pharyngeal -- Pharyngeal- Pudding Cup -- Pharyngeal -- Pharyngeal- Honey Teaspoon -- Pharyngeal -- Pharyngeal- Honey Cup -- Pharyngeal -- Pharyngeal- Nectar Teaspoon Delayed swallow initiation-vallecula Pharyngeal Material does not enter airway Pharyngeal- Nectar Cup -- Pharyngeal Material does not enter airway Pharyngeal- Nectar Straw Penetration/Aspiration during swallow;Reduced laryngeal elevation;Reduced airway/laryngeal closure;Pharyngeal residue - cp segment Pharyngeal Material enters airway, remains ABOVE vocal cords and not ejected out Pharyngeal- Thin Teaspoon Reduced laryngeal elevation;Reduced airway/laryngeal closure;Penetration/Aspiration during swallow Pharyngeal Material enters airway, remains ABOVE vocal cords and not ejected out Pharyngeal- Thin Cup Penetration/Aspiration during swallow;Pharyngeal residue - cp segment;Reduced  laryngeal elevation;Reduced airway/laryngeal closure Pharyngeal Material enters airway, remains ABOVE vocal cords and not ejected out Pharyngeal- Thin Straw Penetration/Aspiration during swallow;Pharyngeal residue - cp segment;Moderate aspiration Pharyngeal Material enters airway, CONTACTS cords and not ejected out;Material enters airway, remains ABOVE vocal cords and not ejected out;Material enters airway, passes BELOW cords and not ejected out despite cough attempt by patient Pharyngeal- Puree WFL;Delayed swallow initiation-vallecula Pharyngeal Material does not enter airway Pharyngeal- Mechanical Soft Delayed swallow initiation-vallecula;Pharyngeal residue - valleculae Pharyngeal Material does not enter airway Pharyngeal- Regular -- Pharyngeal -- Pharyngeal- Multi-consistency -- Pharyngeal -- Pharyngeal- Pill -- Pharyngeal -- Pharyngeal Comment --  CHL IP CERVICAL ESOPHAGEAL PHASE 04/18/2020 Cervical Esophageal Phase Impaired Pudding Teaspoon -- Pudding Cup -- Honey Teaspoon -- Honey Cup -- Nectar Teaspoon Reduced cricopharyngeal relaxation;Prominent cricopharyngeal segment Nectar Cup Reduced cricopharyngeal relaxation;Prominent cricopharyngeal segment Nectar Straw  Reduced cricopharyngeal relaxation;Prominent cricopharyngeal segment Thin Teaspoon Reduced cricopharyngeal relaxation;Prominent cricopharyngeal segment Thin Cup Reduced cricopharyngeal relaxation;Prominent cricopharyngeal segment;Esophageal backflow into the pharynx Thin Straw Prominent cricopharyngeal segment;Reduced cricopharyngeal relaxation;Esophageal backflow into the pharynx Puree Prominent cricopharyngeal segment Mechanical Soft Prominent cricopharyngeal segment Regular -- Multi-consistency -- Pill -- Cervical Esophageal Comment minimal backflow of nectar and thin into pharynx due to decreased relaxation of CP, CP did not impair barium flow of pudding nor cracker bolus Kathleen Lime, MS [facility] SLP Acute Rehab Services Office [phone] Pager  [phone] Macario Golds 04/18/2020, 3:03 PM              Scheduled Meds: . sodium chloride   Intravenous Once  . Chlorhexidine Gluconate Cloth  6 each Topical Daily  . collagenase   Topical Daily  . furosemide  40 mg Intravenous Once  . guaiFENesin  1,200 mg Oral BID  . levothyroxine  112 mcg Oral Q0600  . mouth rinse  15 mL Mouth Rinse BID  . sodium chloride flush  10-40 mL Intracatheter Q12H   Continuous Infusions: . sodium chloride 250 mL (04/13/20 0414)  . levofloxacin (LEVAQUIN) IV 750 mg (04/18/20 1705)    LOS: 6 days   Kerney Elbe, DO [facility] PAGER is on [place]  If 7PM-7AM, please contact night-coverage [email or web address]

## 2020-04-19 NOTE — Progress Notes (Signed)
NAME:  Michelle Avila, MRN:  [id number], DOB:  [birth year], LOS: 6 ADMISSION DATE:  04/12/2020, CONSULTATION DATE:  04/13/20 REFERRING MD:  Roxanne Mins - EM, CHIEF COMPLAINT:  AMS  Brief History   74 yo F with AMS, hypoglycemia dehydration AKI   History of present illness   74 yo F with hx hypothyroidism, presenting with AMS. Neighbors hadn't heard from pt x several days. Daughter came in from out of state and found pt on floor, in feces and very altered. EMS dispatched an gave pt D10 for finding of glucose 19.  In ED, pt felt to be dehydrated and  given 2.5L IVF  CT H obtained >> without acute changes. BCx and Ucx ordered but picture in ED more consistent with dehydration, hypoglycemia than infection and pt not started on abx.  Pt mentation largely improved in ED. She endorsed recent med change -- is now on a diuretic (unknown which) for BLE edema.   Labs significant for Na 134 K 3.2 CO2 12 Glu 159 BUN 46 Cr 2.68  LA 5.4 WBC 9.5   Past Medical History  Hypothyroid  Significant Hospital Events   Swallowing eval 10/22 moderate risk rec  Dysphagia 2 (Fine chop) solids;Nectar thick liquid  Liquid Administration via Straw  Medication Administration Whole meds with puree  Compensations Slow rate;Small sips/bites;Chin tuck;Use straw to facilitate chin tuck  Postural Changes Remain semi-upright after after feeds/meals (Comment);Seated upright at 90 degrees     Consults:  PCCM reconsulted  10/23   Procedures:    Significant Diagnostic Tests:  CT H 10/17> chronic microvascular ischemic changes, mild atrophy related to age.  Micro Data:  10/17 SARS Cov2> neg 10/17 BCx> 1 of 2 staph species, contaminant, 2 of 2 GNRs = flavobacterium  10/17 UCx> <10k CFU 10/17: MRSA screen negative  Antimicrobials:  10/17 zosyn >10/18 10/18 cipro (GNR resistant to all but cipro, imipenem)>> 10/20 Levaquin 10/21 >>>   Scheduled Meds: . Chlorhexidine Gluconate Cloth  6 each Topical Daily  .  collagenase   Topical Daily  . guaiFENesin  1,200 mg Oral BID  . levothyroxine  112 mcg Oral Q0600  . mouth rinse  15 mL Mouth Rinse BID  . sodium chloride flush  10-40 mL Intracatheter Q12H   Continuous Infusions: . sodium chloride 250 mL (04/13/20 0414)  . levofloxacin (LEVAQUIN) IV 750 mg (04/18/20 1705)   PRN Meds:.acetaminophen, docusate sodium, ondansetron (ZOFRAN) IV, polyethylene glycol, Resource ThickenUp Clear, sodium chloride flush    Interim history/subjective:   tolerated vest only 6 min / not really bringing much up  Objective   Blood pressure (!) 110/59, pulse 93, temperature (!) 97.3 F (36.3 C), temperature source Oral, resp. rate 20, height 5\' 5"  (1.651 m), weight 56.2 kg, SpO2 95 %.        Intake/Output Summary (Last 24 hours) at 04/19/2020 1341 Last data filed at 04/19/2020 1300 Gross per 24 hour  Intake 701.7 ml  Output 350 ml  Net 351.7 ml   Filed Weights   04/16/20 0349 04/18/20 0659 04/19/20 0500  Weight: 57.5 kg 55.3 kg 56.2 kg    Examination: Tmax   97.3  sats 100% on 2lpm  Pt extremely weak and debilitated appearing and much older than stated age  No jvd Oropharynx clear,  Mucosa dry Neck supple Lungs with a few scattered rhonchi on R /  Marked decreased bs on L  RRR no s3 or or sign murmur Abd soft with nl excursion though chest wall caves  in a bit with inspiration  Neuro  Sensorium appears intact,  no apparent motor deficits     I personally reviewed images and agree with radiology impression as follows:  CXR:   Portable 10/23  atx L lung with cutoff sign LMB Diffuse infiltrates on R   Resolved Hospital Problem list     Assessment & Plan:   Acute encephalopathy: multifactorial including hypoglycemia, dehydration, sepsis from presumed colitis and bacteremia. CTH without acute changes. Intermittent delirium, hyperactive and hypoactive.  - delirium precautions per [facility]  GNR bacteremia. Septic shock: resolved. hypovolemic shock:  resolved. - abx per micro flowsheet above  AKI: suspected-- unknown baseline. Lab Results  Component Value Date   CREATININE 1.83 (H) 04/19/2020   CREATININE 2.01 (H) 04/18/2020   CREATININE 1.99 (H) 04/17/2020  > back near baseline    Dysarthria: suspect due to dry mouth, critical illness. CT head negative x2. No other focal deficits on face or elsewhere. -SLP eval, consider MRI to eval for small focal stroke  Volume overload: suspected due to hypoalbuminemia, possible liver dysfunction.     Thrombocytopenia:  Lab Results  Component Value Date   PLT 15 (LL) 04/19/2020   PLT 49 (L) 04/18/2020   PLT 11 (LL) 04/17/2020   Due to septic shock, GNR bacteremia. S/p 1u plts  10/20. No acute clot on dopplers, low suspicion for HIT given timing.      Severe Protein calorie malnutrition: Appears to be wasting on exam. 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  The findings are c/w asp pna in pt with above swallowing eval and extremely weak and ineffective cough mechanics.  Explained this to daughter at bedside  rec >>> continue efforts to help with cough = vest/ flutter/ keep hydrated  No escalation of pulmonary /ccm care likely to be helpful longterm/ DNR status is approp here       Labs   CBC: Recent Labs  Lab 04/12/20 2349 04/13/20 0310 04/14/20 2057 04/15/20 0529 04/16/20 0310 04/16/20 1120 04/17/20 0614 04/18/20 0439  04/19/20 0430  WBC 9.5   < > 27.4*   < > 27.9* 24.2* 17.3* 11.2* 7.9  NEUTROABS 8.3*  --  26.0*  --   --  22.0*  --   --  6.4  HGB 9.4*   < > 6.1*   < > 9.0* 8.8* 9.0* 7.3* 8.2*  HCT 33.3*   < > 18.8*   < > 28.1* 27.2* 28.6* 23.3* 27.4*  MCV 80.4   < > 75.2*   < > 80.3 78.4* 80.6 80.1 86.2  PLT 137*   < > 33*   < > 5* 30* 11* 49* 15*   < > = values in this interval not displayed.    Basic Metabolic Panel: Recent Labs  Lab 04/13/20 5456 04/13/20 2563 04/15/20 0529 04/16/20 0310 04/17/20 8937 04/18/20 3428  04/19/20 0430  NA 135   < > 132* 135 136 139 142  K 3.0*   < > 3.7 3.4* 3.4* 3.4* 4.3  CL 108   < > 97* 100 99 101 102  CO2 11*   < > 24 22 25 27 27   GLUCOSE 72   < > 102* 81 90 119* 100*  BUN 45*   < > 40* 42* 44* 46* 40*  CREATININE 2.41*   < > 1.98* 1.96* 1.99* 2.01* 1.83*  CALCIUM 6.9*   < > 6.2* 6.7* 7.3* 7.2* 7.5*  MG 1.6*  --   --   --   --  2.2 2.2  PHOS 5.4*  --   --   --   --   --  6.3*   < > = values in this interval not displayed.   GFR: Estimated Creatinine Clearance: 23.6 mL/min (A) (by C-G formula based on SCr of 1.83 mg/dL (H)). Recent Labs  Lab 04/13/20 0150 04/13/20 0310 04/13/20 0655 04/13/20 0655 04/13/20 1940 04/14/20 0249 04/14/20 0249 04/14/20 2057 04/14/20 2057 04/14/20 2316 04/15/20 0529 04/16/20 0310 04/16/20 1120 04/17/20 0614 04/18/20 0439 04/19/20 0430  PROCALCITON 4.81  --   --   --   --  3.84  --   --   --   --  2.86  --   --   --   --   --   WBC  --    < > 4.6   < >  --  28.0*   < > 27.4*   < >  --  29.9*   < > 24.2* 17.3* 11.2* 7.9  LATICACIDVEN  --    < > 6.0*  --  6.0*  --   --  2.0*  --  1.6  --   --   --   --   --   --    < > = values in this interval not displayed.    Liver Function Tests: Recent Labs  Lab 04/12/20 2349 04/13/20 0655 04/14/20 2057 04/18/20 0439 04/19/20 0430  AST 46* 65* 145* 67* 52*  ALT 26 28 49* 65* 65*  ALKPHOS 178* 142* 126 283* 270*  BILITOT 0.6 0.5 1.2 1.5* 1.0  PROT 4.8* 4.1* 5.0*  4.5* 4.8*  ALBUMIN 1.3* 1.2* 3.0* 1.9* 1.9*   No results for input(s): LIPASE, AMYLASE in the last 168 hours. Recent Labs  Lab 04/13/20 0043  AMMONIA 51*    ABG    Component Value Date/Time   PHART 7.388 04/15/2020 1035   PCO2ART 42.8 04/15/2020 1035   PO2ART 107 04/15/2020 1035   HCO3 25.2 04/15/2020 1035   TCO2 12 (L) 04/13/2020 0832   ACIDBASEDEF 12.0 (H) 04/13/2020 0832   O2SAT 97.1 04/15/2020 1035     Coagulation Profile: Recent Labs  Lab 04/14/20 0845 04/14/20 2316  INR 1.6* 1.6*    Cardiac Enzymes: Recent Labs  Lab 04/13/20 0205  CKTOTAL 392*    HbA1C: No results found for: HGBA1C  CBG: Recent Labs  Lab 04/18/20 1946 04/19/20 0005 04/19/20 0406 04/19/20 0733 04/19/20 1149  GLUCAP 132* 122* 99 92 83      Christinia Gully, MD Pulmonary and [place] [phone]   After 7:00 pm call Elink  [phone]

## 2020-04-19 NOTE — Progress Notes (Signed)
CRITICAL VALUE ALERT  Critical Value: Platelets   Date & Time Notied:  10/23 @ 9144  Provider Notified: Alfredia Ferguson  Orders Received/Actions taken: 2 units of platelets ordered

## 2020-04-19 NOTE — Progress Notes (Signed)
CRITICAL VALUE ALERT  Critical Value:  Plt 15  Date & Time Notied: 04/19/2020 5:53 AM   Provider Notified: NP Blount  Orders Received/Actions taken: None at this time.

## 2020-04-20 ENCOUNTER — Inpatient Hospital Stay (HOSPITAL_COMMUNITY): Payer: Medicare Other

## 2020-04-20 DIAGNOSIS — Z7189 Other specified counseling: Secondary | ICD-10-CM | POA: Diagnosis not present

## 2020-04-20 DIAGNOSIS — R7881 Bacteremia: Secondary | ICD-10-CM | POA: Diagnosis not present

## 2020-04-20 DIAGNOSIS — R579 Shock, unspecified: Secondary | ICD-10-CM | POA: Diagnosis not present

## 2020-04-20 DIAGNOSIS — R4182 Altered mental status, unspecified: Secondary | ICD-10-CM | POA: Diagnosis not present

## 2020-04-20 DIAGNOSIS — D649 Anemia, unspecified: Secondary | ICD-10-CM

## 2020-04-20 DIAGNOSIS — E860 Dehydration: Secondary | ICD-10-CM | POA: Diagnosis not present

## 2020-04-20 DIAGNOSIS — R918 Other nonspecific abnormal finding of lung field: Secondary | ICD-10-CM | POA: Diagnosis not present

## 2020-04-20 DIAGNOSIS — A419 Sepsis, unspecified organism: Secondary | ICD-10-CM | POA: Diagnosis not present

## 2020-04-20 LAB — CBC WITH DIFFERENTIAL/PLATELET
Abs Immature Granulocytes: 0.15 10*3/uL — ABNORMAL HIGH (ref 0.00–0.07)
Basophils Absolute: 0 10*3/uL (ref 0.0–0.1)
Basophils Relative: 0 %
Eosinophils Absolute: 0 10*3/uL (ref 0.0–0.5)
Eosinophils Relative: 0 %
HCT: 22.9 % — ABNORMAL LOW (ref 36.0–46.0)
Hemoglobin: 7 g/dL — ABNORMAL LOW (ref 12.0–15.0)
Immature Granulocytes: 1 %
Lymphocytes Relative: 8 %
Lymphs Abs: 0.8 10*3/uL (ref 0.7–4.0)
MCH: 25.2 pg — ABNORMAL LOW (ref 26.0–34.0)
MCHC: 30.6 g/dL (ref 30.0–36.0)
MCV: 82.4 fL (ref 80.0–100.0)
Monocytes Absolute: 0.6 10*3/uL (ref 0.1–1.0)
Monocytes Relative: 6 %
Neutro Abs: 9 10*3/uL — ABNORMAL HIGH (ref 1.7–7.7)
Neutrophils Relative %: 85 %
Platelets: 70 10*3/uL — ABNORMAL LOW (ref 150–400)
RBC: 2.78 MIL/uL — ABNORMAL LOW (ref 3.87–5.11)
RDW: 25.3 % — ABNORMAL HIGH (ref 11.5–15.5)
WBC: 10.6 10*3/uL — ABNORMAL HIGH (ref 4.0–10.5)
nRBC: 0 % (ref 0.0–0.2)

## 2020-04-20 LAB — COMPREHENSIVE METABOLIC PANEL
ALT: 52 U/L — ABNORMAL HIGH (ref 0–44)
AST: 41 U/L (ref 15–41)
Albumin: 2 g/dL — ABNORMAL LOW (ref 3.5–5.0)
Alkaline Phosphatase: 380 U/L — ABNORMAL HIGH (ref 38–126)
Anion gap: 12 (ref 5–15)
BUN: 51 mg/dL — ABNORMAL HIGH (ref 8–23)
CO2: 27 mmol/L (ref 22–32)
Calcium: 7.3 mg/dL — ABNORMAL LOW (ref 8.9–10.3)
Chloride: 101 mmol/L (ref 98–111)
Creatinine, Ser: 1.9 mg/dL — ABNORMAL HIGH (ref 0.44–1.00)
GFR, Estimated: 27 mL/min — ABNORMAL LOW (ref >60)
Glucose, Bld: 100 mg/dL — ABNORMAL HIGH (ref 70–99)
Potassium: 3.4 mmol/L — ABNORMAL LOW (ref 3.5–5.1)
Sodium: 140 mmol/L (ref 135–145)
Total Bilirubin: 1.2 mg/dL (ref 0.3–1.2)
Total Protein: 5 g/dL — ABNORMAL LOW (ref 6.5–8.1)

## 2020-04-20 LAB — PREPARE PLATELET PHERESIS
Unit division: 0
Unit division: 0

## 2020-04-20 LAB — BPAM PLATELET PHERESIS
Blood Product Expiration Date: 202110232359
Blood Product Expiration Date: 202110232359
ISSUE DATE / TIME: 202110231627
ISSUE DATE / TIME: 202110232319
Unit Type and Rh: 600
Unit Type and Rh: 6200

## 2020-04-20 LAB — GLUCOSE, CAPILLARY
Glucose-Capillary: 108 mg/dL — ABNORMAL HIGH (ref 70–99)
Glucose-Capillary: 120 mg/dL — ABNORMAL HIGH (ref 70–99)
Glucose-Capillary: 57 mg/dL — ABNORMAL LOW (ref 70–99)
Glucose-Capillary: 65 mg/dL — ABNORMAL LOW (ref 70–99)
Glucose-Capillary: 65 mg/dL — ABNORMAL LOW (ref 70–99)
Glucose-Capillary: 85 mg/dL (ref 70–99)
Glucose-Capillary: 89 mg/dL (ref 70–99)
Glucose-Capillary: 99 mg/dL (ref 70–99)

## 2020-04-20 LAB — MAGNESIUM: Magnesium: 1.9 mg/dL (ref 1.7–2.4)

## 2020-04-20 LAB — PHOSPHORUS: Phosphorus: 5.2 mg/dL — ABNORMAL HIGH (ref 2.5–4.6)

## 2020-04-20 LAB — PREPARE RBC (CROSSMATCH)

## 2020-04-20 MED ORDER — POTASSIUM CHLORIDE 20 MEQ/15ML (10%) PO SOLN
40.0000 meq | Freq: Once | ORAL | Status: AC
  Administered 2020-04-20: 40 meq via ORAL
  Filled 2020-04-20: qty 30

## 2020-04-20 MED ORDER — LEVALBUTEROL HCL 0.63 MG/3ML IN NEBU
0.6300 mg | INHALATION_SOLUTION | Freq: Three times a day (TID) | RESPIRATORY_TRACT | Status: DC
  Administered 2020-04-20 – 2020-04-21 (×4): 0.63 mg via RESPIRATORY_TRACT
  Filled 2020-04-20 (×5): qty 3

## 2020-04-20 MED ORDER — ENSURE ENLIVE PO LIQD
237.0000 mL | Freq: Two times a day (BID) | ORAL | Status: DC
  Administered 2020-04-20 – 2020-04-25 (×5): 237 mL via ORAL
  Administered 2020-04-29: 327 mL via ORAL
  Administered 2020-04-29 – 2020-05-02 (×5): 237 mL via ORAL

## 2020-04-20 MED ORDER — SODIUM CHLORIDE 0.9% IV SOLUTION: Freq: Once | INTRAVENOUS | Status: AC

## 2020-04-20 MED ORDER — IPRATROPIUM BROMIDE 0.02 % IN SOLN
0.5000 mg | Freq: Three times a day (TID) | RESPIRATORY_TRACT | Status: DC
  Administered 2020-04-20 – 2020-04-21 (×4): 0.5 mg via RESPIRATORY_TRACT
  Filled 2020-04-20 (×5): qty 2.5

## 2020-04-20 MED ORDER — FUROSEMIDE 10 MG/ML IJ SOLN
40.0000 mg | Freq: Once | INTRAMUSCULAR | Status: AC
  Administered 2020-04-20: 40 mg via INTRAVENOUS
  Filled 2020-04-20: qty 4

## 2020-04-20 NOTE — Progress Notes (Addendum)
Initial Nutrition Assessment  INTERVENTION:   -Ensure Enlive po BID, each supplement provides 350 kcal and 20 grams of protein (thickened)  If poor PO continues and within [place], recommend placement of small bore feeding tube for temporary nutrition support.  TF recommendations if needed: Osmolite 1.2 @ 20 ml/hr, advance by 10 ml every 4 hours to goal rate of 55 ml/hr.  NUTRITION DIAGNOSIS:   Inadequate oral intake related to lethargy/confusion as evidenced by meal completion < 25%.  GOAL:   Patient will meet greater than or equal to 90% of their needs  MONITOR:   PO intake, Supplement acceptance, Labs, Weight trends, I & O's, Skin  REASON FOR ASSESSMENT:   Consult Assessment of nutrition requirement/status, Poor PO  ASSESSMENT:   74 year old chronically ill-appearing cachectic Caucasian female with a past medical history significant for hypothyroidism who presented with altered mental status, hypoglycemia, dehydration.  From report neighbors had not seen or heard from her in several days and daughter came in from out of state and found the patient on the floor in feces and was very altered.  10/16: admitted for septic shock and acute encephalopathy 10/22: ICU ->4E, s/p MBS  Unable to speak with pt at this time, currently alert/oriented x 2.   Per SLP note on 10/22 following MBS,, SLP recommends D2 diet with nectar thick liquids. Pt requires full assist with meals.    Pt has been refusing meals, and consumed 5% of breakfast yesterday. With her diet she receives [employer] with lunch/dinner. Will order Ensure supplements as well but if mentation doesn't improve, most likely isn't going to take this in either.  Noted that Palliative care to have family meeting to discuss [place] this afternoon. If within [place], pt may need small bore feeding tube placed for temporary nutrition support.  Per weight records, pt's weight has increased since admission but this is likely d/t fluid. No  weight records available PTA. Per nursing documentation, pt with moderate UE and LE edema. Suspect some degree of malnutrition based on MD documentation and pt's poor PO and low body weight.   Medications: IV Lasix, KCl  Labs reviewed: CBGs: 65-108 Low K Elevated Phos Mg WNL  NUTRITION - FOCUSED PHYSICAL EXAM:  Unable to complete, will attempt at follow-up  Diet Order:   Diet Order            DIET DYS 2 Room service appropriate? Yes; Fluid consistency: Nectar Thick  Diet effective now                 EDUCATION NEEDS:   Not appropriate for education at this time  Skin:  Skin Assessment: Skin Integrity Issues: Skin Integrity Issues:: DTI, Stage III, Other (Comment) DTI: right, left heels, left knee Stage III: sacrum Other: non-pressure wound on right thigh  Last BM:  10/24 -type 6  Height:   Ht Readings from Last 1 Encounters:  04/13/20 5\' 5"  (1.651 m)    Weight:   Wt Readings from Last 1 Encounters:  04/20/20 54 kg   BMI:  Body mass index is 19.82 kg/m.  Estimated Nutritional Needs:   Kcal:  1550-1750  Protein:  70-85g  Fluid:  1.8L/day  Clayton Bibles, MS, RD, LDN Inpatient Clinical Dietitian Contact information available via Amion

## 2020-04-20 NOTE — Progress Notes (Signed)
After several attempts at Q4 CPT, it appears PTs best option at this time is nebulizer's in line  with Flutter device.

## 2020-04-20 NOTE — Consult Note (Signed)
Consultation Note Date: 04/20/2020   Patient Name: Michelle Avila  DOB: October 13, 1945  MRN: [id number]  Age / Sex: 74 y.o., female  PCP: Street, Michelle Mt, MD Referring Physician: Kerney Elbe, DO  Reason for Consultation: Establishing goals of care  HPI/Patient Profile: 74 y.o. female  with past medical history of hypothyroidism admitted on 04/12/2020 with altered mental status, hyperglycemia, dehydration.  She was reportedly not seen or heard from by neighbors in several days her daughter came from [place] and found her in feces on the floor in an altered state.  Her blood sugar was found to be 19 on arrival by EMS and she was brought to the ED and found to be dehydrated and given fluids.  CT head did not show any acute abnormality.  Cultures were more consistent with dehydration and hyperglycemia rather than infection.  Her mentation did improve initially but she then had declined and developed shock from gram-negative bacteremia.  She did require brief time on pressors and continues to have encephalopathy and electrolyte derangements.  She was stabilized in the ICU and restarted on dysphagia 2 diet after speech evaluation.  She remains frail and cachectic and today was noted to have chest x-ray with essential white out of the left side.  Her platelets continue to be severely impaired and plan for platelet transfusion as well.  Palliative consulted for goals of care.  Clinical Assessment and Goals of Care: I saw and examined patient today.  She is sitting in bed in no distress, but she remains confused.  Attempted to talk with her about her situation and clarify her understanding of current condition.  She is not really able to tell me much about what has been going on and does not have insight into her current condition.  I called and was able to reach patient's daughter, Michelle Avila.  I introduced  palliative care as specialized medical care for people living with serious illness. It focuses on providing relief from the symptoms and stress of a serious illness. The goal is to improve quality of life for both the patient and the family.  We had brief discussion regarding her mother's clinical course this admission.  Michelle Avila thinks will be helpful to have a meeting this exam discuss goals moving forward.  We are planning to meet tomorrow after lunch for family meeting to discuss.  SUMMARY OF RECOMMENDATIONS   -DNR -Michelle Avila is confused and not really able to participate in conversation regarding goals of care.   - I called and was able to reach her daughter, Michelle Avila, today.  We are planning on meeting for a family meeting tomorrow between 44 and 1 PM.   Palliative Prophylaxis:   Bowel Regimen and Frequent Pain Assessment   Psycho-social/Spiritual:   Desire for further Chaplaincy support: Did not address today  Additional Recommendations: Caregiving  Support/Resources  Prognosis:   Guarded  Discharge Planning: To Be Determined      Primary Diagnoses: Present on Admission: . Dehydration . Shock ([place]) . Septic shock ([place])  I have reviewed the medical record, interviewed the patient and family, and examined the patient. The following aspects are pertinent.  History reviewed. No pertinent past medical history. Social History   Socioeconomic History  . Marital status: Widowed    Spouse name: Not on file  . Number of children: Not on file  . Years of education: Not on file  . Highest education level: Not on file  Occupational History  . Not on file  Tobacco Use  . Smoking status: Not on file  Substance and Sexual Activity  . Alcohol use: Not on file  . Drug use: Not on file  . Sexual activity: Not on file  Other Topics Concern  . Not on file  Social History Narrative  . Not on file   Social Determinants of Health   Financial Resource Strain:   .  Difficulty of Paying Living Expenses: Not on file  Food Insecurity:   . Worried About Charity fundraiser in the Last Year: Not on file  . Ran Out of Food in the Last Year: Not on file  Transportation Needs:   . Lack of Transportation (Medical): Not on file  . Lack of Transportation (Non-Medical): Not on file  Physical Activity:   . Days of Exercise per Week: Not on file  . Minutes of Exercise per Session: Not on file  Stress:   . Feeling of Stress : Not on file  Social Connections:   . Frequency of Communication with Friends and Family: Not on file  . Frequency of Social Gatherings with Friends and Family: Not on file  . Attends Religious Services: Not on file  . Active Member of Clubs or Organizations: Not on file  . Attends Archivist Meetings: Not on file  . Marital Status: Not on file   No family history on file. Scheduled Meds: . Chlorhexidine Gluconate Cloth  6 each Topical Daily  . collagenase   Topical Daily  . guaiFENesin  1,200 mg Oral BID  . ipratropium  0.5 mg Nebulization TID  . levalbuterol  0.63 mg Nebulization TID  . levothyroxine  112 mcg Oral Q0600  . mouth rinse  15 mL Mouth Rinse BID  . sodium chloride flush  10-40 mL Intracatheter Q12H   Continuous Infusions: . sodium chloride 250 mL (04/13/20 0414)  . levofloxacin (LEVAQUIN) IV 750 mg (04/18/20 1705)   PRN Meds:.acetaminophen, docusate sodium, ondansetron (ZOFRAN) IV, polyethylene glycol, Resource ThickenUp Clear, sodium chloride flush Medications Prior to Admission:  Prior to Admission medications   Medication Sig Start Date End Date Taking? Authorizing Provider  acetaminophen (TYLENOL) 325 MG tablet Take 650 mg by mouth every 6 (six) hours as needed for mild pain or fever.   Yes [provider]  levothyroxine (SYNTHROID) 112 MCG tablet Take 112 mcg by mouth every morning. 03/27/20  Yes [provider]  torsemide (DEMADEX) 10 MG tablet Take 5-10 mg by mouth daily as needed  (leg swelling).  01/30/20  Yes [provider]  VIRT-GARD 2.2-25-1 MG TABS Take 1 tablet by mouth at bedtime. 03/27/20  Yes [provider]   Not on File Review of Systems  Unreliable historian, but currently denies complaints  Physical Exam  General: Alert, awake, in no acute distress.  Mildly confused, frail and cachectic HEENT: No bruits, no goiter, no JVD Heart: Regular rate and rhythm. No murmur appreciated. Lungs: Decreased with coarse sounds scattered throughout Abdomen: Soft, nontender, nondistended, positive bowel sounds.  Ext: Lower extremity edema  Skin: Warm and dry  Vital Signs: BP (!) 141/82 (BP Location: Left Arm)   Pulse 96   Temp (!) 97.5 F (36.4 C) (Oral)   Resp 18   Ht 5\' 5"  (1.651 m)   Wt 54 kg   SpO2 96%   BMI 19.82 kg/m  Pain Scale: 0-10   Pain Score: 0-No pain   SpO2: SpO2: 96 % O2 Device:SpO2: 96 % O2 Flow Rate: .O2 Flow Rate (L/min): 6 L/min (decreased to 5 lpm post neb)  IO: Intake/output summary:   Intake/Output Summary (Last 24 hours) at 04/20/2020 0836 Last data filed at 04/20/2020 0600 Gross per 24 hour  Intake 855 ml  Output 650 ml  Net 205 ml    LBM: Last BM Date: 04/15/20 Baseline Weight: Weight: 45.4 kg Most recent weight: Weight: 54 kg     Palliative Assessment/Data:    Time Total: 55 minutes Greater than 50%  of this time was spent counseling and coordinating care related to the above assessment and plan.  Signed by: [name], MD   Please contact Palliative Medicine Team phone at [phone] for questions and concerns.  For individual provider: See Shea Evans

## 2020-04-20 NOTE — Progress Notes (Signed)
Daily Progress Note   Patient Name: Michelle Avila       Date: 04/20/2020 DOB: July 07, 1945  Age: 74 y.o. MRN#: [id number] Attending Physician: Kerney Elbe, DO Primary Care Physician: Street, Sharon Mt, MD Admit Date: 04/12/2020  Reason for Consultation/Follow-up: Establishing goals of care  Subjective: I met today with Michelle Avila and her daughter.  She is confused and asks multiple times to go home.  Discussed need to remain in the hospital with patient and also further reviewed clinical course over the last 24 hours with her daughter.  We discussed pertinent labs and reviewed imaging together.  Discussed possible pathways forward for care and daughters hope that she continues to improve with continue medical interventions.  Patient is frustrated as she reports being very independent but not able to manage her day to day activities as she wants to in the hospital.  Daughter expressed thanks for care her mother has received as well as for time to review her care plan.  Length of Stay: 7  Current Medications: Scheduled Meds:  . Chlorhexidine Gluconate Cloth  6 each Topical Daily  . collagenase   Topical Daily  . feeding supplement  237 mL Oral BID BM  . guaiFENesin  1,200 mg Oral BID  . ipratropium  0.5 mg Nebulization TID  . levalbuterol  0.63 mg Nebulization TID  . levothyroxine  112 mcg Oral Q0600  . mouth rinse  15 mL Mouth Rinse BID  . sodium chloride flush  10-40 mL Intracatheter Q12H    Continuous Infusions: . sodium chloride 250 mL (04/13/20 0414)  . levofloxacin (LEVAQUIN) IV 750 mg (04/20/20 1706)    PRN Meds: acetaminophen, docusate sodium, ondansetron (ZOFRAN) IV, polyethylene glycol, Resource ThickenUp Clear, sodium chloride flush  Physical  Exam    General: Alert, awake, in no acute distress. Confused.  Frail and chronically ill appearing.  HEENT: No bruits, no goiter, no JVD Heart: Regular rate and rhythm. No murmur appreciated. Lungs: decreased air movement, coarse throughout Abdomen: Soft, nontender, nondistended, positive bowel sounds.  Ext: + edema Skin: Warm and dry Neuro: Grossly intact, nonfocal.        Vital Signs: BP 125/79 (BP Location: Left Arm)   Pulse 86   Temp 97.7 F (36.5 C) (Oral)   Resp 18   Ht _0  (1.651  m)   Wt 54 kg   SpO2 99%   BMI 19.82 kg/m  SpO2: SpO2: 99 % O2 Device: O2 Device: Nasal Cannula O2 Flow Rate: O2 Flow Rate (L/min): 5 L/min  Intake/output summary:   Intake/Output Summary (Last 24 hours) at 04/20/2020 2205 Last data filed at 04/20/2020 1700 Gross per 24 hour  Intake 1123.46 ml  Output 1425 ml  Net -301.54 ml   LBM: Last BM Date: (P) 04/20/20 Baseline Weight: Weight: 45.4 kg Most recent weight: Weight: 54 kg       Palliative Assessment/Data:      Patient Active Problem List   Diagnosis Date Noted  . Pulmonary infiltrates on CXR 04/19/2020  . Altered mental status   . Thrombocytopenia ([place])   . Dehydration 04/13/2020  . Shock ([place]) 04/13/2020  . Septic shock ([place]) 04/13/2020  . Pressure injury of skin 04/13/2020    Palliative Care Assessment & Plan  Recommendations/Plan: DNR/DNI Continue current interventions.  Plan to continue to follow and progress conversation regarding [place] based on clinical course over the next few days.  Code Status:    Code Status Orders  (From admission, onward)         Start     Ordered   04/16/20 1544  Do not attempt resuscitation (DNR)  Continuous       Question Answer Comment  In the event of cardiac or respiratory ARREST Do not call a "code blue"   In the event of cardiac or respiratory ARREST Do not perform Intubation, CPR, defibrillation or ACLS   In the event of cardiac or respiratory ARREST Use medication by  any route, position, wound care, and other measures to relive pain and suffering. May use oxygen, suction and manual treatment of airway obstruction as needed for comfort.      04/16/20 1543        Code Status History    Date Active Date Inactive Code Status Order ID Comments User Context   04/13/2020 0213 04/16/2020 1543 Full Code [id number]  Shellia Cleverly, MD ED   Advance Care Planning Activity      Prognosis:  Guarded  Discharge Planning: To Be Determined  Care plan was discussed with daughter  Thank you for allowing the Palliative Medicine Team to assist in the care of this patient.   Time In: 5366 Time Out: 1320 Total Time 35 Prolonged Time Billed No      Greater than 50%  of this time was spent counseling and coordinating care related to the above assessment and plan.  [name], MD  Please contact Palliative Medicine Team phone at [phone] for questions and concerns.

## 2020-04-20 NOTE — Plan of Care (Signed)

## 2020-04-20 NOTE — Progress Notes (Signed)
PROGRESS NOTE    Michelle Avila  FWY:[id number] DOB: Nov 08, 1945 DOA: 04/12/2020 PCP: Venetia Maxon, Sharon Mt, MD  Brief Narrative:  The patient is a 74 year old chronically ill-appearing cachectic Caucasian female with a past medical history significant for hypothyroidism who presented with altered mental status, hypoglycemia, dehydration.  From report neighbors had not seen or heard from her in several days and daughter came in from out of state and found the patient on the floor in feces and was very altered.  EMS was dispatched and upon evaluation her blood sugar was 19 and so they gave her D10.  She was brought to the ED and found to be dehydrated and given 2-1/2 L of IV fluids.  CT head was obtained without acute changes.  Blood and urine cultures were ordered but the ED felt the picture was more consistent with dehydration and hypoglycemia rather than infection so she is not started on antibiotics at that time.  Her mentation largely improved in the ED and she endorsed a recent med change and was placed on a diuretic for her bilateral lower extremity edema.  Further work-up revealed that she ended up going into septic shock and hypovolemic shock from a gram-negative bacteremia likely from being in her feces and possible colitis.  She was started on IV Zosyn and changed to ciprofloxacin but I evaluated and she is on IM ceftriaxone.  She was placed on pressors and had encephalopathy and electrolyte derangements.  They were adjusted and corrected and she was given albumin x2.  She was stabilized in the ICU and had some dysphagia so she was n.p.o. and SLP evaluated and they are now recommending a dysphagia 2 diet with nectar thick liquids.  She is transferred to [facility] service 04/18/2020 and when I evaluated her today she is very frail and cachectic and likely has failure to thrive so we will consult palliative care and dietitian for further evaluation recommendations.  During her hospitalization she had  critical illness thrombocytopenia and was given 2 packs of platelets.  04/19/20: Palliative care was consulted and will see the patient today.  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  Platelet counts continue to drop and will be transfused 2 more units of platelets and I discussed the case with Dr. Alen Blew who feels that this is secondary to her septic shock and her bacteremia and recommend supportive care at this time and transfusions.  We will continue diuresis as tolerated and she will get Lasix twice today.  04/20/20: Today but still confused and wanting to go home.  Blood count has dropped so we will transfuse 1 unit of PRBCs.  WBC is trending upwards.  Potassium is on the lower side.  Renal function is relatively stable compared to yesterday.  6 twice daily again today.  Palliative care to have a goals of care discussion.  Continue on Levaquin.  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 with likely possible concomitant pneumonia -In the ED she is found to be dehydrated and given 2.5 L of IV fluids -Daughter found the patient on the floor in feces and very altered and patient initially had a blood glucose of 19 -Urine culture showing less than  10,000 colony-forming units of insignificant growth -We will change IM ceftriaxone to IV levofloxacin for now; IV Zosyn has now been stopped -WBC is improving and trending down and has gone from 27.9 -> 24.2 ->  17.3 -> 11.2 and has trended down and now trending back up slowly and gone from 9.2 is now 10.6 -Procalcitonin level went from 4.81 and trended down to 2.86  -Lactic Acid level went from 6.0 and then trended down to 1.6 -She is now off of  pressors -IV fluid was resumed at 75 mL's per hour with D5 and lactated Ringer's and will discontinue now given her hypoalbuminemia and likely volume overload. -Changed ceftriaxone to IV Levofloxacin for 14 Days -We will obtain PT and OT to further evaluate and treat and have palliative care discuss goals of care given her significant failure to thrive -PT OT evaluation still pending  Acute Encephalopathy, waxing and waning -Likely multifactorial including her severe hypoglycemia on admission, dehydration, sepsis from her colitis and bacteremia -Initial head and repeat CT done had no acute changes  -She had ICU delirium and was hyperactive and hypoactive but the critical care physician feels that the delirium may be a sign that she may be actively dying -Delirium precautions initiated -Palliative care consulted for further goals of care discussion and patient was somnolent and sleepy yesterday but awake and alert today and agitated and wanting to go home.  Volume overload in the setting of volume resuscitation as well as acute on chronic diastolic CHF -In the setting of her poor nutritional status and possible liver dysfunction -She appeared dry yesterday and so she was initiated on D5 in LR but to me she appears extremely volume overloaded so I stopped her fluid hydration -Now that she has been placed on a diet will stop IV fluid hydration and continue to monitor closely -We will give her IV Lasix and give IV Lasix this morning and again this afternoon  -Echocardiogram showed a normal EF but grade 2 diastolic dysfunction -BNP was elevated at 1,113.0 -Strict I's and O's and daily weights -Patient is +23 pounds since admission and is now trending back down and she is +19 pounds since admission and down 4 pounds from yesterday weight and is 14.84 L positive -Continue with diuresis as tolerated given her blood pressure -We will reconsult pulmonary and continue diuresis and consider cardiology  consultation -Pulmonary had no further recommendations  Acute Respiratory Failure with Hypoxia -The setting of above next-patient also has been aspirating likely -Continue Levaquin and diuresis -Chest x-ray done and showed "Extensive airspace disease with asymmetry favoring infection over edema. There are pleural effusions, greater on the left, and there may be superimposed failure." -Continue with IV Levaquin for infectio -we will add pulmonary toileting, guaifenesin 1200 mg p.o. twice daily, flutter valve, incentive spirometer, Xopenex and Atrovent breathing treatments -She is on 4 L supplemental oxygen via nasal cannula -SpO2: 100 % O2 Flow Rate (L/min): 5 L/min -Continuous pulse oximetry maintain O2 saturation greater than 90% -Continue supplemental oxygen via nasal cannula wean as tolerated next-we will need ambulatory home O2 screen prior to discharge -Reconsulted pulmonary for further evaluation recommendations -Repeat chest x-ray this AM showed "Patchy interstitial/ground-glass opacities bilaterally, edema versus multifocal pneumonia. Improved aeration of the LEFT upper lung. Persistent layering pleural effusion on the LEFT, moderate to large in size. Probable small RIGHT pleural effusion." -Continue chest PT and flutter valve and incentive spirometry  Dysphagia -Patient has moderately severe oral pharyngeal dysphagia; today SLP evaluated and recommended  n.p.o. 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  Lower extremity bilateral edema -Echocardiogram done and showed grade 2 diastolic dysfunction with a normal EF; will check a BNP -We will extremity venous duplexes done and showed a chronic right lower extremity DVT with no role for anticoagulation at this time given her severe thrombocytopenia -We will try diuresis given her significant volume overload in the setting of heart failure and hypoalbuminemia -We will continue to monitor  Severe Protein Calorie Malnutrition/Failure to Thrive -Now placed on a dysphagia 2 diet -We will consult nutritionist for further evaluation recommendations  Hyperbilirubinemia -Patient's T bili went from 0.5 on admission and has trended up to 1.5 and her Direct Bilirubin was 0.5 and indirect was 1.0 the day before yesterday; now T bili is 1.0 yesterday and today is 1.2 -Liver Doppler done and as below -Continue monitor and trend and repeat CMP in the a.m.   Hypothyroidism -Patient's TSH was 9.921 and free T4 0.67 -Her levothyroxine has been resumed at 112 mcg   Abnormal LFTs, improving -Still somewhat elevated but trending down from earlier on admission -patient's AST is 67 and improved to 52 in 1-41 and ALT is now 65 is gone to 52 -Liver Doppler done on 10.18 showed "Widely patent hepatic vasculature with normal directional flow. Nonspecific atrophy of the left lobe of the liver as demonstrated on preceding  abdominal CT. No discrete hepatic lesions. Trace amount of perihepatic ascites and small/trace bilateral effusions as was demonstrated on preceding abdominal CT. Cholelithiasis without evidence of cholecystitis." -Continue monitor and trend and repeat CMP in a.m.  Thrombocytopenia -Patient's Platelet Count dropped to 5 and now slowly trending up as it has gone to 30 -> 11 -> 49 and trended back down to 15 and then 10 yesterday.  Will transfuse 2 more packs of platelets and is improved to 70 -In the Setting of Septic Shock and her gram-negative bacteremia and I spoke with oncology who feels that this is secondary to her infection and not related to HUS TTP or ITP -She is transfused 2 packs of platelets  -There is low suspicion for HIT given the timing but Lovenox has been discontinued -Continue to Monitor for S/Sx of Bleeding; Currently no overt bleeding noted -Repeat CBC in the AM  Hypokalemia -Patient's K+ was 3.4 this a.m. -Replete with p.o. KCl 40 mg x 1 -Check Mag Level but was 2.2 -Continue to Monitor and Replete as Necessary -Repeat CMP  Normocytic anemia/anemia of critical  illness -Patient's hemoglobin/mother went from 8.2/20.4 trended down to 7.9/26.0 today 7.0/22.9 and we will transfuse 1 unit PRBCs next-we will give Lasix before and after next-continue to monitor for signs and symptoms of bleeding; currently no overt bleeding noted Repeat CBC in a.m.  DVT prophylaxis: SCDs given her significant thrombocytopenia prohibiting pharmacological prophylaxis Code Status: DO NOT RESUSCITATE Family Communication: Spoke with the daughter at bedside Disposition Plan: Pending further clinical improvement back to baseline and tolerance of p.o. diet; she will need PT OT to further evaluate and treat  Status is: Inpatient  Remains inpatient appropriate because:Unsafe d/c plan, IV treatments appropriate due to intensity of illness or inability to take PO and Inpatient level of care appropriate  due to severity of illness   Dispo: The patient is from: Home              Anticipated d/c is to: TBD              Anticipated d/c date is: 3 days              Patient currently is not medically stable to d/c.  Consultants:   PCCM Transfer  Palliative care medicine  Significant Diagnostic Tests:  CT H 10/17> chronic microvascular ischemic changes, mild atrophy related to age.  Micro Data:  10/17 SARS Cov2> neg 10/17 BCx> 1 of 2 staph species, contaminant, 2 of 2 GNRs 10/17 UCx> <10k CFU 10/17: MRSA screen negative Antimicrobials:  10/17 zosyn >10/18 10/18 cipro (GNR resistant to all but cipro, imipenem)>>  Echocardiogram IMPRESSIONS    1. Left ventricular ejection fraction, by estimation, is 55 to 60%. The  left ventricle has normal function. Left ventricular endocardial border  not optimally defined to evaluate regional wall motion. Left ventricular  diastolic parameters are consistent  with Grade II diastolic dysfunction (pseudonormalization). Elevated left  ventricular end-diastolic pressure.  2. Right ventricular systolic function is normal. The right ventricular  size is normal. There is normal pulmonary artery systolic pressure. The  estimated right ventricular systolic pressure is 03.4 mmHg.  3. The mitral valve is grossly normal. No evidence of mitral valve  regurgitation. No evidence of mitral stenosis.  4. The aortic valve is abnormal. There is moderate calcification of the  aortic valve. Aortic valve regurgitation is not visualized. Moderate  aortic valve sclerosis/calcification is present, without any evidence of  aortic stenosis.  5. The inferior vena cava is normal in size with <50% respiratory  variability, suggesting right atrial pressure of 8 mmHg.   FINDINGS  Left Ventricle: Left ventricular ejection fraction, by estimation, is 55  to 60%. The left ventricle has normal function. Left ventricular  endocardial border not optimally defined to  evaluate regional wall motion.  The left ventricular internal cavity  size was normal in size. There is no left ventricular hypertrophy. Left  ventricular diastolic parameters are consistent with Grade II diastolic  dysfunction (pseudonormalization). Elevated left ventricular end-diastolic  pressure.   Right Ventricle: The right ventricular size is normal. No increase in  right ventricular wall thickness. Right ventricular systolic function is  normal. There is normal pulmonary artery systolic pressure. The tricuspid  regurgitant velocity is 2.36 m/s, and  with an assumed right atrial pressure of 8 mmHg, the estimated right  ventricular systolic pressure is 74.2 mmHg.   Left Atrium: Left atrial size was normal in size.   Right Atrium: Right atrial size was normal in size.   Pericardium: There is no evidence of pericardial effusion.  Mitral Valve: Mild chordal SAM. The mitral valve is grossly normal. Mild  mitral annular calcification. No evidence of mitral valve regurgitation.  No evidence of mitral valve stenosis.   Tricuspid Valve: The tricuspid valve is normal in structure. Tricuspid  valve regurgitation is mild . No evidence of tricuspid stenosis.   Aortic Valve: The aortic valve is abnormal. There is moderate  calcification of the aortic valve. Aortic valve regurgitation is not  visualized. Mild to moderate aortic valve sclerosis/calcification is  present, without any evidence of aortic stenosis.   Pulmonic Valve: The pulmonic valve was grossly normal. Pulmonic valve  regurgitation is trivial. No evidence of pulmonic stenosis.   Aorta: The aortic root is normal in size and structure.   Venous: The inferior vena cava is normal in size with less than 50%  respiratory variability, suggesting right atrial pressure of 8 mmHg.   IAS/Shunts: No atrial level shunt detected by color flow Doppler.   Additional Comments: A venous catheter is visualized in the right atrium.      LEFT VENTRICLE  PLAX 2D  LVIDd:     4.20 cm Diastology  LVIDs:     2.50 cm LV e' medial:  3.68 cm/s  LV PW:     1.00 cm LV E/e' medial: 19.0  LV IVS:    0.80 cm LV e' lateral:  6.13 cm/s  LVOT diam:   2.00 cm LV E/e' lateral: 11.4  LV SV:     54  LV SV Index:  35  LVOT Area:   3.14 cm     RIGHT VENTRICLE  RV S prime:   11.20 cm/s  TAPSE (M-mode): 1.5 cm   LEFT ATRIUM       Index    RIGHT ATRIUM      Index  LA diam:    2.90 cm 1.88 cm/m RA Area:   15.00 cm  LA Vol (A2C):  50.7 ml 32.79 ml/m RA Volume:  34.20 ml 22.12 ml/m  LA Vol (A4C):  25.2 ml 16.30 ml/m  LA Biplane Vol: 39.6 ml 25.61 ml/m  AORTIC VALVE  LVOT Vmax:  90.50 cm/s  LVOT Vmean: 66.250 cm/s  LVOT VTI:  0.172 m    AORTA  Ao Root diam: 3.00 cm  Ao Asc diam: 2.70 cm   MITRAL VALVE        TRICUSPID VALVE  MV Area (PHT): 3.12 cm  TR Peak grad:  22.3 mmHg  MV Decel Time: 243 msec  TR Vmax:    236.00 cm/s  MV E velocity: 69.80 cm/s  MV A velocity: 69.00 cm/s SHUNTS  MV E/A ratio: 1.01    Systemic VTI: 0.17 m               Systemic Diam: 2.00 cm   LE VENOUS DUPLEX Summary:  RIGHT:  - Findings consistent with chronic deep vein thrombosis involving the  visualized segments of the right peroneal veins.  - A cystic structure is found in the popliteal fossa.    LEFT:  - There is no evidence of deep vein thrombosis in the lower extremity.  However, portions of this examination were limited- see technologist  comments above.    *See table(s) above for measurements and observations.    Antimicrobials:  Anti-infectives (From admission, onward)   Start     Dose/Rate Route Frequency Ordered Stop   04/18/20 1600  levofloxacin (LEVAQUIN) IVPB 750 mg        750 mg 100 mL/hr over 90 Minutes  Intravenous Every 48 hours 04/18/20 0951     04/17/20 2230  cefTRIAXone (ROCEPHIN) injection 1 g  Status:   Discontinued        1 g Intramuscular Every 24 hours 04/17/20 2144 04/18/20 0949   04/17/20 1400  levofloxacin (LEVAQUIN) IVPB 500 mg  Status:  Discontinued        500 mg 100 mL/hr over 60 Minutes Intravenous Every 24 hours 04/17/20 1157 04/17/20 2144   04/14/20 1600  ciprofloxacin (CIPRO) IVPB 400 mg  Status:  Discontinued        400 mg 200 mL/hr over 60 Minutes Intravenous Every 24 hours 04/14/20 1443 04/17/20 1157   04/13/20 2000  vancomycin (VANCOCIN) IVPB 1000 mg/200 mL premix        1,000 mg 200 mL/hr over 60 Minutes Intravenous  Once 04/13/20 1844 04/13/20 2111   04/13/20 1850  vancomycin variable dose per unstable renal function (pharmacist dosing)  Status:  Discontinued         Does not apply See admin instructions 04/13/20 1851 04/14/20 1003   04/13/20 1200  piperacillin-tazobactam (ZOSYN) IVPB 2.25 g  Status:  Discontinued        2.25 g 100 mL/hr over 30 Minutes Intravenous Every 8 hours 04/13/20 0639 04/14/20 1442   04/13/20 0230  piperacillin-tazobactam (ZOSYN) IVPB 3.375 g       Note to Pharmacy: Pharmacy to dose   3.375 g 100 mL/hr over 30 Minutes Intravenous  Once 04/13/20 0216 04/13/20 0342        Subjective: Seen and examined at bedside and was confused but much more awake today.  Wanting to go home and is adamant that she wanted to go home.  Denies any complaints and still has a weak cough.  No nausea or vomiting.  Continues to have leg extremity weeping.  No family at bedside.  No other concerns or complaints at this time.  Objective: Vitals:   04/20/20 1252 04/20/20 1305 04/20/20 1335 04/20/20 1431  BP: 135/71 135/71 (!) 142/69 137/60  Pulse: 94 98 90 92  Resp: 20  18 20   Temp: 97.7 F (36.5 C) 97.7 F (36.5 C) 98.3 F (36.8 C) 98.2 F (36.8 C)  TempSrc: Oral Oral Oral Oral  SpO2: 100% 100% 100% 100%  Weight:      Height:        Intake/Output Summary (Last 24 hours) at 04/20/2020 1525 Last data filed at 04/20/2020 1500 Gross per 24 hour  Intake  1123.46 ml  Output 1125 ml  Net -1.54 ml   Filed Weights   04/18/20 0659 04/19/20 0500 04/20/20 0500  Weight: 55.3 kg 56.2 kg 54 kg   Examination: Physical Exam:  Constitutional: The patient is a thin very frail cachectic chronically ill appearing Caucasian female in NAD and is awake and agitated this AM  Eyes: Lids and conjunctivae normal, sclerae anicteric  ENMT: External Ears, Nose appear normal. Grossly normal hearing.  Neck: Appears normal, supple, no cervical masses, normal ROM, no appreciable thyromegaly, mild appreciable JVD Respiratory: Diminished to auscultation bilaterally with coarse breath sounds and some crackles and some rhonchi.  No appreciable wheezing.  Wearing supplemental oxygen via nasal cannula, Cardiovascular: RRR, no murmurs / rubs / gallops. S1 and S2 auscultated.  Has 1-2+ extremity edema with some weeping Abdomen: Soft, non-tender, non-distended. N Bowel sounds positive.  GU: Deferred. Musculoskeletal: No clubbing / cyanosis of digits/nails. No joint deformity upper and lower extremities.  Skin: Has petechiae and and ecchymosis diffusely Dr. Helene Kelp from his.  No induration; Warm and dry.  Neurologic: CN 2-12 grossly intact no appreciable focal deficits.  Romberg sign and cerebellar reflexes not assessed.  Psychiatric: Impaired judgment and insight.  He is awake and alert but not fully oriented x 3.  Anxious and agitated mood and appropriate affect.   Data Reviewed: I have personally reviewed following labs and imaging studies  CBC: Recent Labs  Lab 04/14/20 2057 04/15/20 0529 04/16/20 1120 04/16/20 1120 04/17/20 0614 04/18/20 0439 04/19/20 0430 04/19/20 1330 04/20/20 0446  WBC 27.4*   < > 24.2*   < > 17.3* 11.2* 7.9 9.2 10.6*  NEUTROABS 26.0*  --  22.0*  --   --   --  6.4 7.5 9.0*  HGB 6.1*   < > 8.8*   < > 9.0* 7.3* 8.2* 7.9* 7.0*  HCT 18.8*   < > 27.2*   < > 28.6* 23.3* 27.4* 26.0* 22.9*  MCV 75.2*   < > 78.4*   < > 80.6 80.1 86.2 85.0 82.4  PLT  33*   < > 30*   < > 11* 49* 15* 10* 70*   < > = values in this interval not displayed.   Basic Metabolic Panel: Recent Labs  Lab 04/16/20 0310 04/17/20 0614 04/18/20 0439 04/19/20 0430 04/20/20 0446  NA 135 136 139 142 140  K 3.4* 3.4* 3.4* 4.3 3.4*  CL 100 99 101 102 101  CO2 22 25 27 27 27   GLUCOSE 81 90 119* 100* 100*  BUN 42* 44* 46* 40* 51*  CREATININE 1.96* 1.99* 2.01* 1.83* 1.90*  CALCIUM 6.7* 7.3* 7.2* 7.5* 7.3*  MG  --   --  2.2 2.2 1.9  PHOS  --   --   --  6.3* 5.2*   GFR: Estimated Creatinine Clearance: 21.8 mL/min (A) (by C-G formula based on SCr of 1.9 mg/dL (H)). Liver Function Tests: Recent Labs  Lab 04/14/20 2057 04/18/20 0439 04/19/20 0430 04/20/20 0446  AST 145* 67* 52* 41  ALT 49* 65* 65* 52*  ALKPHOS 126 283* 270* 380*  BILITOT 1.2 1.5* 1.0 1.2  PROT 5.0* 4.5* 4.8* 5.0*  ALBUMIN 3.0* 1.9* 1.9* 2.0*   No results for input(s): LIPASE, AMYLASE in the last 168 hours. No results for input(s): AMMONIA in the last 168 hours. Coagulation Profile: Recent Labs  Lab 04/14/20 0845 04/14/20 2316  INR 1.6* 1.6*   Cardiac Enzymes: No results for input(s): CKTOTAL, CKMB, CKMBINDEX, TROPONINI in the last 168 hours. BNP (last 3 results) No results for input(s): PROBNP in the last 8760 hours. HbA1C: No results for input(s): HGBA1C in the last 72 hours. CBG: Recent Labs  Lab 04/20/20 0413 04/20/20 0452 04/20/20 0546 04/20/20 0759 04/20/20 1120  GLUCAP 57* 65* 65* 108* 120*   Lipid Profile: No results for input(s): CHOL, HDL, LDLCALC, TRIG, CHOLHDL, LDLDIRECT in the last 72 hours. Thyroid Function Tests: No results for input(s): TSH, T4TOTAL, FREET4, T3FREE, THYROIDAB in the last 72 hours. Anemia Panel: No results for input(s): VITAMINB12, FOLATE, FERRITIN, TIBC, IRON, RETICCTPCT in the last 72 hours. Sepsis Labs: Recent Labs  Lab 04/13/20 1940 04/14/20 0249 04/14/20 2057 04/14/20 2316 04/15/20 0529  PROCALCITON  --  3.84  --   --  2.86   LATICACIDVEN 6.0*  --  2.0* 1.6  --     Recent Results (from the past 240 hour(s))  Culture, blood (routine x 2)     Status: Abnormal   Collection Time: 04/12/20 11:45 PM   Specimen: BLOOD RIGHT  ARM  Result Value Ref Range Status   Specimen Description BLOOD RIGHT ARM  Final   Special Requests   Final    BOTTLES DRAWN AEROBIC AND ANAEROBIC Blood Culture adequate volume   Culture  Setup Time   Final    GRAM NEGATIVE RODS GRAM POSITIVE COCCI IN CLUSTERS IN BOTH AEROBIC AND ANAEROBIC BOTTLES CRITICAL RESULT CALLED TO, READ BACK BY AND VERIFIED WITH: PHARMD E WILLIAMSON [id number] [place] 1757 BY CM Performed at [facility], [place] [address] [place], [place] [address]    Culture (A)  Final    FLAVOBACTERIUM ODORATUM AEROCOCCUS VIRIDANS STAPHYLOCOCCUS XYLOSUS    Report Status 04/15/2020 FINAL  Final  Blood Culture ID Panel (Reflexed)     Status: Abnormal   Collection Time: 04/12/20 11:45 PM  Result Value Ref Range Status   Enterococcus faecalis NOT DETECTED NOT DETECTED Final   Enterococcus Faecium NOT DETECTED NOT DETECTED Final   Listeria monocytogenes NOT DETECTED NOT DETECTED Final   Staphylococcus species DETECTED (A) NOT DETECTED Final    Comment: CRITICAL RESULT CALLED TO, READ BACK BY AND VERIFIED WITH: PHARMD E WILLIAMSON [id number] AT 1757 BY CM    Staphylococcus aureus (BCID) NOT DETECTED NOT DETECTED Final   Staphylococcus epidermidis NOT DETECTED NOT DETECTED Final   Staphylococcus lugdunensis NOT DETECTED NOT DETECTED Final   Streptococcus species NOT DETECTED NOT DETECTED Final   Streptococcus agalactiae NOT DETECTED NOT DETECTED Final   Streptococcus pneumoniae NOT DETECTED NOT DETECTED Final   Streptococcus pyogenes NOT DETECTED NOT DETECTED Final   A.calcoaceticus-baumannii NOT DETECTED NOT DETECTED Final   Bacteroides fragilis NOT DETECTED NOT DETECTED Final   Enterobacterales NOT DETECTED NOT DETECTED Final   Enterobacter cloacae complex NOT DETECTED NOT DETECTED  Final   Escherichia coli NOT DETECTED NOT DETECTED Final   Klebsiella aerogenes NOT DETECTED NOT DETECTED Final   Klebsiella oxytoca NOT DETECTED NOT DETECTED Final   Klebsiella pneumoniae NOT DETECTED NOT DETECTED Final   Proteus species NOT DETECTED NOT DETECTED Final   Salmonella species NOT DETECTED NOT DETECTED Final   Serratia marcescens NOT DETECTED NOT DETECTED Final   Haemophilus influenzae NOT DETECTED NOT DETECTED Final   Neisseria meningitidis NOT DETECTED NOT DETECTED Final   Pseudomonas aeruginosa NOT DETECTED NOT DETECTED Final   Stenotrophomonas maltophilia NOT DETECTED NOT DETECTED Final   Candida albicans NOT DETECTED NOT DETECTED Final   Candida auris NOT DETECTED NOT DETECTED Final   Candida glabrata NOT DETECTED NOT DETECTED Final   Candida krusei NOT DETECTED NOT DETECTED Final   Candida parapsilosis NOT DETECTED NOT DETECTED Final   Candida tropicalis NOT DETECTED NOT DETECTED Final   Cryptococcus neoformans/gattii NOT DETECTED NOT DETECTED Final    Comment: Performed at [facility], [address], [place], [place] [address]  Respiratory Panel by RT PCR (Flu A&B, Covid) - Nasopharyngeal Swab     Status: None   Collection Time: 04/13/20 12:13 AM   Specimen: Nasopharyngeal Swab  Result Value Ref Range Status   SARS Coronavirus 2 by RT PCR NEGATIVE NEGATIVE Final    Comment: (NOTE) SARS-CoV-2 target nucleic acids are NOT DETECTED.  The SARS-CoV-2 RNA is generally detectable in upper respiratoy specimens during the acute phase of infection. The lowest concentration of SARS-CoV-2 viral copies this assay can detect is 131 copies/mL. A negative result does not preclude SARS-Cov-2 infection and should not be used as the sole basis for treatment or other patient management decisions. A negative result may occur with  improper specimen collection/handling, submission of specimen other than nasopharyngeal swab, presence of viral mutation(s) within the areas  targeted by this assay, and inadequate number of viral copies (<131 copies/mL). A negative result must be combined with clinical observations, patient history, and epidemiological information. The expected result is Negative.  Fact Sheet for Patients:  PinkCheek.be  Fact Sheet for Healthcare Providers:  GravelBags.it  This test is no t yet approved or cleared by the [place] FDA and  has been authorized for detection and/or diagnosis of SARS-CoV-2 by FDA under an Emergency Use Authorization (EUA). This EUA will remain  in effect (meaning this test can be used) for the duration of the COVID-19 declaration under Section 564(b)(1) of the Act, 21 U.S.C. section 360bbb-3(b)(1), unless the authorization is terminated or revoked sooner.     Influenza A by PCR NEGATIVE NEGATIVE Final   Influenza B by PCR NEGATIVE NEGATIVE Final    Comment: (NOTE) The Xpert Xpress SARS-CoV-2/FLU/RSV assay is intended as an aid in  the diagnosis of influenza from Nasopharyngeal swab specimens and  should not be used as a sole basis for treatment. Nasal washings and  aspirates are unacceptable for Xpert Xpress SARS-CoV-2/FLU/RSV  testing.  Fact Sheet for Patients: PinkCheek.be  Fact Sheet for Healthcare Providers: GravelBags.it  This test is not yet approved or cleared by the [place] FDA and  has been authorized for detection and/or diagnosis of SARS-CoV-2 by  FDA under an Emergency Use Authorization (EUA). This EUA will remain  in effect (meaning this test can be used) for the duration of the  Covid-19 declaration under Section 564(b)(1) of the Act, 21  U.S.C. section 360bbb-3(b)(1), unless the authorization is  terminated or revoked. Performed at [facility], [place] [address], [place], [place] [address]   Culture, blood (routine x 2)     Status: Abnormal   Collection  Time: 04/13/20  1:18 AM   Specimen: BLOOD RIGHT HAND  Result Value Ref Range Status   Specimen Description BLOOD RIGHT HAND  Final   Special Requests   Final    BOTTLES DRAWN AEROBIC ONLY Blood Culture adequate volume   Culture  Setup Time   Final    GRAM NEGATIVE RODS AEROBIC BOTTLE ONLY CRITICAL RESULT CALLED TO, READ BACK BY AND VERIFIED WITH: PHARMD S CHRISTY [id number] AT 1820 BY CM Performed at [facility], [place] [address], [place], [place] [address]    Culture FLAVOBACTERIUM ODORATUM (A)  Final   Report Status 04/15/2020 FINAL  Final   Organism ID, Bacteria FLAVOBACTERIUM ODORATUM  Final      Susceptibility   Flavobacterium odoratum - MIC*    CEFAZOLIN >=64 RESISTANT Resistant     CEFTAZIDIME >=64 RESISTANT Resistant     CIPROFLOXACIN 1 SENSITIVE Sensitive     GENTAMICIN >=16 RESISTANT Resistant     IMIPENEM 4 SENSITIVE Sensitive     TRIMETH/SULFA >=320 RESISTANT Resistant     PIP/TAZO 32 INTERMEDIATE Intermediate     * FLAVOBACTERIUM ODORATUM  Urine culture     Status: Abnormal   Collection Time: 04/13/20  6:18 AM   Specimen: Urine, Catheterized  Result Value Ref Range Status   Specimen Description URINE, CATHETERIZED  Final   Special Requests NONE  Final   Culture (A)  Final    <10,000 COLONIES/mL INSIGNIFICANT GROWTH Performed at [facility], [place] [address], [place], [place] [address]    Report Status 04/14/2020 FINAL  Final  MRSA PCR Screening  Status: None   Collection Time: 04/13/20 10:33 AM   Specimen: Nasopharyngeal  Result Value Ref Range Status   MRSA by PCR NEGATIVE NEGATIVE Final    Comment:        The GeneXpert MRSA Assay (FDA approved for NASAL specimens only), is one component of a comprehensive MRSA colonization surveillance program. It is not intended to diagnose MRSA infection nor to guide or monitor treatment for MRSA infections. Performed at [facility], [place] [address], [place], [place] [address]    Gastrointestinal Panel by PCR , Stool     Status: None   Collection Time: 04/13/20  2:36 PM   Specimen: Stool  Result Value Ref Range Status   Campylobacter species NOT DETECTED NOT DETECTED Final   Plesimonas shigelloides NOT DETECTED NOT DETECTED Final   Salmonella species NOT DETECTED NOT DETECTED Final   Yersinia enterocolitica NOT DETECTED NOT DETECTED Final   Vibrio species NOT DETECTED NOT DETECTED Final   Vibrio cholerae NOT DETECTED NOT DETECTED Final   Enteroaggregative E coli (EAEC) NOT DETECTED NOT DETECTED Final   Enteropathogenic E coli (EPEC) NOT DETECTED NOT DETECTED Final   Enterotoxigenic E coli (ETEC) NOT DETECTED NOT DETECTED Final   Shiga like toxin producing E coli (STEC) NOT DETECTED NOT DETECTED Final   Shigella/Enteroinvasive E coli (EIEC) NOT DETECTED NOT DETECTED Final   Cryptosporidium NOT DETECTED NOT DETECTED Final   Cyclospora cayetanensis NOT DETECTED NOT DETECTED Final   Entamoeba histolytica NOT DETECTED NOT DETECTED Final   Giardia lamblia NOT DETECTED NOT DETECTED Final   Adenovirus F40/41 NOT DETECTED NOT DETECTED Final   Astrovirus NOT DETECTED NOT DETECTED Final   Norovirus GI/GII NOT DETECTED NOT DETECTED Final   Rotavirus A NOT DETECTED NOT DETECTED Final   Sapovirus (I, II, IV, and V) NOT DETECTED NOT DETECTED Final    Comment: Performed at [facility], [address] [place], [place] [address]  C Difficile Quick Screen w PCR reflex     Status: None   Collection Time: 04/14/20 11:40 AM   Specimen: Stool  Result Value Ref Range Status   C Diff antigen NEGATIVE NEGATIVE Final   C Diff toxin NEGATIVE NEGATIVE Final   C Diff interpretation No C. difficile detected.  Final    Comment: Performed at [facility], [place] [address], [place], [place] [address]     RN Pressure Injury Documentation: Pressure Injury 04/13/20 Heel Right Deep Tissue Pressure Injury - Purple or maroon localized area of discolored intact skin  or blood-filled blister due to damage of underlying soft tissue from pressure and/or shear. spanning entire sole of foot (Active)  04/13/20 1030  Location: Heel  Location Orientation: Right  Staging: Deep Tissue Pressure Injury - Purple or maroon localized area of discolored intact skin or blood-filled blister due to damage of underlying soft tissue from pressure and/or shear.  Wound Description (Comments): spanning entire sole of foot  Present on Admission: Yes     Pressure Injury 04/13/20 Heel Left Deep Tissue Pressure Injury - Purple or maroon localized area of discolored intact skin or blood-filled blister due to damage of underlying soft tissue from pressure and/or shear. (Active)  04/13/20 1030  Location: Heel  Location Orientation: Left  Staging: Deep Tissue Pressure Injury - Purple or maroon localized area of discolored intact skin or blood-filled blister due to damage of underlying soft tissue from pressure and/or shear.  Wound Description (Comments):   Present on Admission: Yes  Pressure Injury 04/13/20 Sacrum Stage 3 -  Full thickness tissue loss. Subcutaneous fat may be visible but bone, tendon or muscle are NOT exposed. deep tissue injury surrounding stg 3 (Active)  04/13/20 1030  Location: Sacrum  Location Orientation:   Staging: Stage 3 -  Full thickness tissue loss. Subcutaneous fat may be visible but bone, tendon or muscle are NOT exposed.  Wound Description (Comments): deep tissue injury surrounding stg 3  Present on Admission: Yes     Pressure Injury 04/13/20 Knee Anterior;Left Deep Tissue Pressure Injury - Purple or maroon localized area of discolored intact skin or blood-filled blister due to damage of underlying soft tissue from pressure and/or shear. (Active)  04/13/20 1030  Location: Knee  Location Orientation: Anterior;Left  Staging: Deep Tissue Pressure Injury - Purple or maroon localized area of discolored intact skin or blood-filled blister due to damage of  underlying soft tissue from pressure and/or shear.  Wound Description (Comments):   Present on Admission: Yes    Estimated body mass index is 19.82 kg/m as calculated from the following:   Height as of this encounter: 5\' 5"  (1.651 m).   Weight as of this encounter: 54 kg.  Malnutrition Type:  Nutrition Problem: Inadequate oral intake Etiology: lethargy/confusion Malnutrition Characteristics:  Signs/Symptoms: meal completion < 25%  Nutrition Interventions:  Interventions: Ensure Enlive (each supplement provides 350kcal and 20 grams of protein), MVI, Magic cup Radiology Studies: DG CHEST PORT 1 VIEW  Result Date: 04/20/2020 CLINICAL DATA:  Shortness of breath EXAM: PORTABLE CHEST 1 VIEW COMPARISON:  Chest x-ray dated 04/19/2020. FINDINGS: Improved aeration of the LEFT upper lung. Persistent layering pleural effusion on the LEFT, moderate to large in size. Stable interstitial opacities within the upper and lower zones of the RIGHT lung. Probable small RIGHT pleural effusion. No pneumothorax is seen. Heart size and mediastinal contours are grossly stable. IMPRESSION: 1. Patchy interstitial/ground-glass opacities bilaterally, edema versus multifocal pneumonia. 2. Improved aeration of the LEFT upper lung. 3. Persistent layering pleural effusion on the LEFT, moderate to large in size. 4. Probable small RIGHT pleural effusion. Electronically Signed   By: Franki Cabot M.D.   On: 04/20/2020 06:27   DG CHEST PORT 1 VIEW  Result Date: 04/19/2020 CLINICAL DATA:  Dyspnea EXAM: PORTABLE CHEST 1 VIEW COMPARISON:  04/12/2020 FINDINGS: Extensive airspace opacity, confluent on the left where there is nearly no aerated lung. Pleural effusions on the left more than right. Largely obscured heart size which is likely enlarged. Right-sided PICC with tip at the upper right atrium. No pneumothorax IMPRESSION: Extensive airspace disease with asymmetry favoring infection over edema. There are pleural effusions,  greater on the left, and there may be superimposed failure. Electronically Signed   By: Monte Fantasia M.D.   On: 04/19/2020 06:59   Scheduled Meds: . Chlorhexidine Gluconate Cloth  6 each Topical Daily  . collagenase   Topical Daily  . feeding supplement  237 mL Oral BID BM  . guaiFENesin  1,200 mg Oral BID  . ipratropium  0.5 mg Nebulization TID  . levalbuterol  0.63 mg Nebulization TID  . levothyroxine  112 mcg Oral Q0600  . mouth rinse  15 mL Mouth Rinse BID  . sodium chloride flush  10-40 mL Intracatheter Q12H   Continuous Infusions: . sodium chloride 250 mL (04/13/20 0414)  . levofloxacin (LEVAQUIN) IV 750 mg (04/18/20 1705)    LOS: 7 days   Kerney Elbe, DO [facility] PAGER is on [place]  If 7PM-7AM, please contact  night-coverage [email or web address]

## 2020-04-20 NOTE — Progress Notes (Signed)
PT demonstrated hands on understanding of [place].

## 2020-04-20 NOTE — Progress Notes (Signed)
PT tolerated Vest better this morning- will attempt again for 1200 visit.

## 2020-04-21 ENCOUNTER — Inpatient Hospital Stay (HOSPITAL_COMMUNITY): Payer: Medicare Other

## 2020-04-21 DIAGNOSIS — A419 Sepsis, unspecified organism: Secondary | ICD-10-CM | POA: Diagnosis not present

## 2020-04-21 DIAGNOSIS — E860 Dehydration: Secondary | ICD-10-CM | POA: Diagnosis not present

## 2020-04-21 DIAGNOSIS — R4182 Altered mental status, unspecified: Secondary | ICD-10-CM | POA: Diagnosis not present

## 2020-04-21 DIAGNOSIS — Z7189 Other specified counseling: Secondary | ICD-10-CM | POA: Diagnosis not present

## 2020-04-21 DIAGNOSIS — R7989 Other specified abnormal findings of blood chemistry: Secondary | ICD-10-CM | POA: Diagnosis not present

## 2020-04-21 DIAGNOSIS — R7881 Bacteremia: Secondary | ICD-10-CM | POA: Diagnosis not present

## 2020-04-21 DIAGNOSIS — R579 Shock, unspecified: Secondary | ICD-10-CM | POA: Diagnosis not present

## 2020-04-21 LAB — TYPE AND SCREEN
ABO/RH(D): O POS
Antibody Screen: NEGATIVE
Unit division: 0

## 2020-04-21 LAB — CBC WITH DIFFERENTIAL/PLATELET
Abs Immature Granulocytes: 0.12 10*3/uL — ABNORMAL HIGH (ref 0.00–0.07)
Basophils Absolute: 0 10*3/uL (ref 0.0–0.1)
Basophils Relative: 0 %
Eosinophils Absolute: 0 10*3/uL (ref 0.0–0.5)
Eosinophils Relative: 0 %
HCT: 27.1 % — ABNORMAL LOW (ref 36.0–46.0)
Hemoglobin: 8.5 g/dL — ABNORMAL LOW (ref 12.0–15.0)
Immature Granulocytes: 1 %
Lymphocytes Relative: 6 %
Lymphs Abs: 0.7 10*3/uL (ref 0.7–4.0)
MCH: 26.7 pg (ref 26.0–34.0)
MCHC: 31.4 g/dL (ref 30.0–36.0)
MCV: 85.2 fL (ref 80.0–100.0)
Monocytes Absolute: 0.5 10*3/uL (ref 0.1–1.0)
Monocytes Relative: 4 %
Neutro Abs: 9.3 10*3/uL — ABNORMAL HIGH (ref 1.7–7.7)
Neutrophils Relative %: 89 %
Platelets: 18 10*3/uL — CL (ref 150–400)
RBC: 3.18 MIL/uL — ABNORMAL LOW (ref 3.87–5.11)
RDW: 24.1 % — ABNORMAL HIGH (ref 11.5–15.5)
WBC: 10.6 10*3/uL — ABNORMAL HIGH (ref 4.0–10.5)
nRBC: 0 % (ref 0.0–0.2)

## 2020-04-21 LAB — COMPREHENSIVE METABOLIC PANEL
ALT: 46 U/L — ABNORMAL HIGH (ref 0–44)
AST: 38 U/L (ref 15–41)
Albumin: 1.9 g/dL — ABNORMAL LOW (ref 3.5–5.0)
Alkaline Phosphatase: 398 U/L — ABNORMAL HIGH (ref 38–126)
Anion gap: 13 (ref 5–15)
BUN: 45 mg/dL — ABNORMAL HIGH (ref 8–23)
CO2: 27 mmol/L (ref 22–32)
Calcium: 7 mg/dL — ABNORMAL LOW (ref 8.9–10.3)
Chloride: 104 mmol/L (ref 98–111)
Creatinine, Ser: 1.55 mg/dL — ABNORMAL HIGH (ref 0.44–1.00)
GFR, Estimated: 35 mL/min — ABNORMAL LOW (ref >60)
Glucose, Bld: 109 mg/dL — ABNORMAL HIGH (ref 70–99)
Potassium: 3 mmol/L — ABNORMAL LOW (ref 3.5–5.1)
Sodium: 144 mmol/L (ref 135–145)
Total Bilirubin: 0.9 mg/dL (ref 0.3–1.2)
Total Protein: 4.9 g/dL — ABNORMAL LOW (ref 6.5–8.1)

## 2020-04-21 LAB — GLUCOSE, CAPILLARY
Glucose-Capillary: 101 mg/dL — ABNORMAL HIGH (ref 70–99)
Glucose-Capillary: 104 mg/dL — ABNORMAL HIGH (ref 70–99)
Glucose-Capillary: 110 mg/dL — ABNORMAL HIGH (ref 70–99)
Glucose-Capillary: 139 mg/dL — ABNORMAL HIGH (ref 70–99)
Glucose-Capillary: 157 mg/dL — ABNORMAL HIGH (ref 70–99)
Glucose-Capillary: 85 mg/dL (ref 70–99)
Glucose-Capillary: 86 mg/dL (ref 70–99)

## 2020-04-21 LAB — PHOSPHORUS: Phosphorus: 3.7 mg/dL (ref 2.5–4.6)

## 2020-04-21 LAB — BPAM RBC
Blood Product Expiration Date: 202111242359
ISSUE DATE / TIME: 202110241312
Unit Type and Rh: 5100

## 2020-04-21 LAB — MAGNESIUM: Magnesium: 1.8 mg/dL (ref 1.7–2.4)

## 2020-04-21 LAB — BRAIN NATRIURETIC PEPTIDE: B Natriuretic Peptide: 437.2 pg/mL — ABNORMAL HIGH (ref 0.0–100.0)

## 2020-04-21 MED ORDER — SODIUM CHLORIDE 0.9% IV SOLUTION: Freq: Once | INTRAVENOUS | Status: DC

## 2020-04-21 MED ORDER — POTASSIUM CHLORIDE 10 MEQ/100ML IV SOLN
10.0000 meq | INTRAVENOUS | Status: AC
  Administered 2020-04-21 (×4): 10 meq via INTRAVENOUS
  Filled 2020-04-21 (×4): qty 100

## 2020-04-21 MED ORDER — FUROSEMIDE 10 MG/ML IJ SOLN
40.0000 mg | Freq: Two times a day (BID) | INTRAMUSCULAR | Status: DC
  Administered 2020-04-21 – 2020-04-27 (×13): 40 mg via INTRAVENOUS
  Filled 2020-04-21 (×13): qty 4

## 2020-04-21 MED ORDER — MAGNESIUM SULFATE 2 GM/50ML IV SOLN
2.0000 g | Freq: Once | INTRAVENOUS | Status: AC
  Administered 2020-04-21: 2 g via INTRAVENOUS
  Filled 2020-04-21: qty 50

## 2020-04-21 MED ORDER — POTASSIUM CHLORIDE 20 MEQ/15ML (10%) PO SOLN
40.0000 meq | Freq: Two times a day (BID) | ORAL | Status: DC
  Administered 2020-04-21 – 2020-05-02 (×18): 40 meq via ORAL
  Filled 2020-04-21 (×25): qty 30

## 2020-04-21 MED ORDER — CALCIUM GLUCONATE-NACL 1-0.675 GM/50ML-% IV SOLN
1.0000 g | Freq: Once | INTRAVENOUS | Status: AC
  Administered 2020-04-21: 1000 mg via INTRAVENOUS
  Filled 2020-04-21: qty 50

## 2020-04-21 NOTE — Plan of Care (Signed)

## 2020-04-21 NOTE — Progress Notes (Signed)
No further pulmonary recommendations at this time. See Dr. Gustavus Bryant prior note for plan of care.  PCCM will be available PRN.   Noe Gens, MSN, NP-C, AGACNP-BC [facility] 04/21/2020, 12:14 PM   Please see [email or web address] for pager details.

## 2020-04-21 NOTE — Evaluation (Signed)
Physical Therapy Evaluation Patient Details Name: Michelle Avila MRN: [id number] DOB: 1945/10/16 Today's Date: 04/21/2020   History of Present Illness  74 y.o. female  admitted after her dtr found her in feces on the floor in an altered state, septic, required brief time on pressors and continues to have encephalopathy and electrolyte derangements.  She was stabilized in the ICU, transferred the medical floor however continues to be confused. past medical history of hypothyroidism admitted on 04/12/2020 with altered mental status, hyperglycemia, dehydration.  Clinical Impression  Pt admitted with above diagnosis.  Pt continues to be confused and easily agitated. Able to get pt to sit EOB (overall min assist). Further activity not attempted d/t pt incontinent of stool and also agitated/not agreeable. Recommend SNF post acute. If dtr chooses to take pt home she will most certainly need 24 hour care.  Will continue to follow in acute setting.  Pt currently with functional limitations due to the deficits listed below (see PT Problem List). Pt will benefit from skilled PT to increase their independence and safety with mobility to allow discharge to the venue listed below.       Follow Up Recommendations SNF;Supervision/Assistance - 24 hour    Equipment Recommendations  None recommended by PT (defer to SNF)    Recommendations for Other Services       Precautions / Restrictions Precautions Precautions: Fall Restrictions Weight Bearing Restrictions: No      Mobility  Bed Mobility Overal bed mobility: Needs Assistance Bed Mobility: Supine to Sit;Sit to Supine     Supine to sit: Min assist Sit to supine: Min assist   General bed mobility comments: assist to elevate trunk to upright and to lift LEs on to bed for return to supine    Transfers                 General transfer comment: pt not agreeable, also incontinent of stool  Ambulation/Gait              General Gait Details: NT  Stairs            Wheelchair Mobility    Modified Rankin (Stroke Patients Only)       Balance Overall balance assessment: Needs assistance Sitting-balance support: No upper extremity supported;Feet supported Sitting balance-Leahy Scale: Fair         Standing balance comment: NT                             Pertinent Vitals/Pain Pain Assessment: No/denies pain    Home Living Family/patient expects to be discharged to:: Unsure Living Arrangements: Alone               Additional Comments: pt lived alone and was apparently fairly independent at her baseline according to the chart. no family present at time of eval and pt is unablt to provide info    Prior Function                 Hand Dominance        Extremity/Trunk Assessment   Upper Extremity Assessment Upper Extremity Assessment: Generalized weakness;Difficult to assess due to impaired cognition    Lower Extremity Assessment Lower Extremity Assessment: Generalized weakness;Difficult to assess due to impaired cognition       Communication   Communication: No difficulties  Cognition Arousal/Alertness: Awake/alert Behavior During Therapy:  (easily agitated) Overall Cognitive Status: No family/caregiver present to determine baseline cognitive functioning Area  of Impairment: Orientation;Following commands;Attention;Problem solving                   Current Attention Level: Sustained   Following Commands: Follows one step commands inconsistently;Follows multi-step commands inconsistently     Problem Solving: Slow processing;Decreased initiation;Requires verbal cues;Requires tactile cues        General Comments      Exercises     Assessment/Plan    PT Assessment Patient needs continued PT services  PT Problem List Decreased strength;Decreased safety awareness;Decreased mobility;Decreased activity tolerance;Decreased balance;Decreased  knowledge of use of DME;Decreased cognition       PT Treatment Interventions DME instruction;Therapeutic exercise;Gait training;Functional mobility training;Therapeutic activities;Patient/family education    PT Goals (Current goals can be found in the Care Plan section)  Acute Rehab PT Goals Patient Stated Goal: pt unable to state PT Goal Formulation: With patient Time For Goal Achievement: 05/05/20 Potential to Achieve Goals: Fair    Frequency Min 2X/week   Barriers to discharge        Co-evaluation               AM-PAC PT "6 Clicks" Mobility  Outcome Measure Help needed turning from your back to your side while in a flat bed without using bedrails?: A Little Help needed moving from lying on your back to sitting on the side of a flat bed without using bedrails?: A Little Help needed moving to and from a bed to a chair (including a wheelchair)?: A Lot Help needed standing up from a chair using your arms (e.g., wheelchair or bedside chair)?: A Lot Help needed to walk in hospital room?: Total Help needed climbing 3-5 steps with a railing? : Total 6 Click Score: 12    End of Session Equipment Utilized During Treatment: Gait belt Activity Tolerance: Treatment limited secondary to agitation;Patient limited by fatigue Patient left: in bed;with call bell/phone within reach;with bed alarm set Nurse Communication: Mobility status PT Visit Diagnosis: Difficulty in walking, not elsewhere classified (R26.2);Muscle weakness (generalized) (M62.81)    Time: 3846-6599 PT Time Calculation (min) (ACUTE ONLY): 10 min   Charges:   PT Evaluation $PT Eval Moderate Complexity: [place], PT  Acute Rehab Dept ([place]) [phone] Pager [phone]  04/21/2020   [facility] 04/21/2020, 1:02 PM

## 2020-04-21 NOTE — Progress Notes (Signed)
Daily Progress Note   Patient Name: Michelle Avila       Date: 04/21/2020 DOB: 04-Sep-1945  Age: 74 y.o. MRN#: [id number] Attending Physician: Kerney Elbe, DO Primary Care Physician: Street, Sharon [place], MD Admit Date: 04/12/2020  Reason for Consultation/Follow-up: Establishing goals of care  Subjective: I met today with Michelle Avila.  Her daughter is not at the bedside today.  She is confused and agitated.  She reports being "trapped" and that she is angry that she is not allowed to go home and do what she wants to do.  She became frustrated and refused to talk further.  Length of Stay: 8  Current Medications: Scheduled Meds:  . sodium chloride   Intravenous Once  . Chlorhexidine Gluconate Cloth  6 each Topical Daily  . collagenase   Topical Daily  . feeding supplement  237 mL Oral BID BM  . furosemide  40 mg Intravenous BID  . guaiFENesin  1,200 mg Oral BID  . ipratropium  0.5 mg Nebulization TID  . levalbuterol  0.63 mg Nebulization TID  . levothyroxine  112 mcg Oral Q0600  . mouth rinse  15 mL Mouth Rinse BID  . potassium chloride  40 mEq Oral BID  . sodium chloride flush  10-40 mL Intracatheter Q12H    Continuous Infusions: . sodium chloride 250 mL (04/13/20 0414)  . levofloxacin (LEVAQUIN) IV 750 mg (04/20/20 1706)    PRN Meds: acetaminophen, docusate sodium, ondansetron (ZOFRAN) IV, polyethylene glycol, Resource ThickenUp Clear, sodium chloride flush  Physical Exam    General: Alert, awake, in no acute distress. Confused and agitated.  Frail and chronically ill appearing.  HEENT: No bruits, no goiter, no JVD Heart: Regular rate and rhythm. No murmur appreciated. Lungs: decreased air movement, coarse throughout Abdomen: Soft, nontender,  nondistended, positive bowel sounds.  Ext: + edema Skin: Warm and dry Neuro: Grossly intact, nonfocal.        Vital Signs: BP (!) 153/83 (BP Location: Left Arm)   Pulse 92   Temp (!) 97.5 F (36.4 C) (Oral)   Resp 15   Ht 5' 5"  (1.651 m)   Wt 54 kg   SpO2 96%   BMI 19.82 kg/m  SpO2: SpO2: 96 % O2 Device: O2 Device: Nasal Cannula O2 Flow Rate: O2 Flow Rate (L/min): 5 L/min  Intake/output summary:   Intake/Output Summary (Last 24 hours) at 04/21/2020 2340 Last data filed at 04/21/2020 1800 Gross per 24 hour  Intake 718.93 ml  Output 2050 ml  Net -1331.07 ml   LBM: Last BM Date: 04/21/20 Baseline Weight: Weight: 45.4 kg Most recent weight: Weight: 54 kg       Palliative Assessment/Data:      Patient Active Problem List   Diagnosis Date Noted  . Pulmonary infiltrates on CXR 04/19/2020  . Altered mental status   . Thrombocytopenia ([place])   . Dehydration 04/13/2020  . Shock ([place]) 04/13/2020  . Septic shock ([place]) 04/13/2020  . Pressure injury of skin 04/13/2020    Palliative Care Assessment & Plan  Recommendations/Plan: DNR/DNI Continue current interventions.  Patient confused and not able to participate in conversation today.  Daughter not at the bedside but reported that she will return tomorrow. Plan to continue to follow and progress conversation regarding [facility] based on clinical course over the next few days.  Code Status:    Code Status Orders  (From admission, onward)         Start     Ordered   04/16/20 1544  Do not attempt resuscitation (DNR)  Continuous       Question Answer Comment  In the event of cardiac or respiratory ARREST Do not call a "code blue"   In the event of cardiac or respiratory ARREST Do not perform Intubation, CPR, defibrillation or ACLS   In the event of cardiac or respiratory ARREST Use medication by any route, position, wound care, and other measures to relive pain and suffering. May use oxygen, suction and manual treatment  of airway obstruction as needed for comfort.      04/16/20 1543        Code Status History    Date Active Date Inactive Code Status Order ID Comments User Context   04/13/2020 0213 04/16/2020 1543 Full Code [id number]  Shellia Cleverly, MD ED   Advance Care Planning Activity      Prognosis:  Guarded  Discharge Planning: To Be Determined  Care plan was discussed with patient  Thank you for allowing the Palliative Medicine Team to assist in the care of this patient.   Time In: 1500 Time Out: 1520 Total Time 20 Prolonged Time Billed No   Greater than 50%  of this time was spent counseling and coordinating care related to the above assessment and plan.  [name], MD  Please contact Palliative Medicine Team phone at [phone] for questions and concerns.

## 2020-04-21 NOTE — Progress Notes (Signed)
PROGRESS NOTE    Michelle Avila  EXB:[id number] DOB: 03-20-1946 DOA: 04/12/2020 PCP: Venetia Maxon, Sharon Mt, MD  Brief Narrative:  The patient is a 74 year old chronically ill-appearing cachectic Caucasian female with a past medical history significant for hypothyroidism who presented with altered mental status, hypoglycemia, dehydration.  From report neighbors had not seen or heard from her in several days and daughter came in from out of state and found the patient on the floor in feces and was very altered.  EMS was dispatched and upon evaluation her blood sugar was 19 and so they gave her D10.  She was brought to the ED and found to be dehydrated and given 2-1/2 L of IV fluids.  CT head was obtained without acute changes.  Blood and urine cultures were ordered but the ED felt the picture was more consistent with dehydration and hypoglycemia rather than infection so she is not started on antibiotics at that time.  Her mentation largely improved in the ED and she endorsed a recent med change and was placed on a diuretic for her bilateral lower extremity edema.  Further work-up revealed that she ended up going into septic shock and hypovolemic shock from a gram-negative bacteremia likely from being in her feces and possible colitis.  She was started on IV Zosyn and changed to ciprofloxacin but I evaluated and she is on IM ceftriaxone.  She was placed on pressors and had encephalopathy and electrolyte derangements.  They were adjusted and corrected and she was given albumin x2.  She was stabilized in the ICU and had some dysphagia so she was n.p.o. and SLP evaluated and they are now recommending a dysphagia 2 diet with nectar thick liquids.  She is transferred to [facility] service 04/18/2020 and when I evaluated her today she is very frail and cachectic and likely has failure to thrive so we will consult palliative care and dietitian for further evaluation recommendations.  During her hospitalization she had  critical illness thrombocytopenia and was given 2 packs of platelets.  04/19/20: Palliative care was consulted and will see the patient today.  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  Platelet counts continue to drop and will be transfused 2 more units of platelets and I discussed the case with Dr. Alen Blew who feels that this is secondary to her septic shock and her bacteremia and recommend supportive care at this time and transfusions.  We will continue diuresis as tolerated and she will get Lasix twice today.  04/20/20: Today but still confused and wanting to go home.  Blood count has dropped so we will transfuse 1 unit of PRBCs.  WBC is trending upwards.  Potassium is on the lower side.  Renal function is relatively stable compared to yesterday.  6 twice daily again today.  Palliative care to have a goals of care discussion.  Continue on Levaquin.  04/21/2020: She remains significantly volume overloaded still is still positive over 13 L.  We'll continue IV diuresis.  Platelet count dropped again to 18 so we'll type and screen transfuse 1 unit PRBCs.  Electrolytes are abnormal and will be repeated in the setting of her diuresis.  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 with likely possible concomitant pneumonia -In the ED she is found to be dehydrated and given 2.5 L of IV fluids -Daughter found the patient on the floor in feces and very altered and patient initially had a blood glucose of 19 -Urine culture showing less than 10,000 colony-forming units of insignificant growth -We will change IM ceftriaxone to IV levofloxacin for  now; IV Zosyn has now been stopped -WBC is improving and trending down and has gone from 27.9 -> 24.2 ->  17.3 -> 11.2 and has trended down and now trending back up slowly and gone from 9.2 is now 10.6 yesterday and again today -Procalcitonin level went from 4.81 and trended down to 2.86  -Lactic Acid level went from 6.0 and then trended down to 1.6 -She is now off of pressors -IV fluid was resumed at 75 mL's per hour with D5 and lactated Ringer's and will discontinue now given her hypoalbuminemia and likely volume overload. -Changed ceftriaxone to IV Levofloxacin for 14 Days -We will obtain PT and OT to further evaluate and treat and have palliative care discuss goals of care given her significant failure to thrive -PT OT evaluation done and recommending skilled nursing facility post acute hospitalization discharge  Acute Encephalopathy, waxing and waning -Likely multifactorial including her severe hypoglycemia on admission, dehydration, sepsis from her colitis and bacteremia -Initial head and repeat CT done had no acute changes  -She had ICU delirium and was hyperactive and hypoactive but the critical care physician feels that the delirium may be a sign that she may be actively dying -Delirium precautions initiated -Palliative care consulted for further goals of care discussion and patient was somnolent and sleepy the day before yesterday but is again awake and alert today and agitated and wanting to go home.  Volume overload in the setting of volume resuscitation as well as acute on chronic diastolic CHF -In the setting of her poor nutritional status and possible liver dysfunction -She appeared dry yesterday and so she was initiated on D5 in LR but to me she appears extremely volume overloaded so I stopped her fluid hydration -Now that she has been placed on a diet will stop IV fluid hydration and continue to monitor closely -We will give her IV Lasix and give IV Lasix this morning and again  this afternoon  -Echocardiogram showed a normal EF but grade 2 diastolic dysfunction -BNP was elevated at 1,113.0 and is now improving to 437.2 -Strict I's and O's and daily weights -Patient was +20 pound since admission and is now improving with diuresis and is down 4 pounds from the day before yesterday and is now down to 119.  Repeat weight has not been done today. -Now +12.879 L and will continue diuresis.  He was positive almost 15 L a few days ago -Continue with diuresis as tolerated given her blood pressure and will get IV Lasix 40 mg twice daily -We will reconsult pulmonary and continue diuresis and consider cardiology consultation -Pulmonary had no further recommendations  Acute Respiratory Failure with Hypoxia, stable -The setting of above next-patient also has been aspirating likely -Continue Levaquin and diuresis -Chest x-ray done yesterday and showed "Extensive airspace disease with asymmetry favoring infection over edema. There are pleural effusions, greater on the left, and there may be superimposed failure." -Chest x-ray 04/21/2020 showed "Stable bilateral pulmonary edema and pleural effusions." -Continue with IV Levaquin for infection -we will add pulmonary toileting, guaifenesin 1200 mg  p.o. twice daily, flutter valve, incentive spirometer, Xopenex and Atrovent breathing treatments -She is on 4 L supplemental oxygen via nasal cannula -SpO2: 94 % O2 Flow Rate (L/min): 4 L/min -Continuous pulse oximetry maintain O2 saturation greater than 90% -Continue supplemental oxygen via nasal cannula wean as tolerated next-we will need ambulatory home O2 screen prior to discharge -Reconsulted pulmonary for further evaluation recommendations -Repeat chest x-ray this AM showed "Patchy interstitial/ground-glass opacities bilaterally, edema versus multifocal pneumonia. Improved aeration of the LEFT upper lung. Persistent layering pleural effusion on the LEFT, moderate to large in size.  Probable small RIGHT pleural effusion." -Continue chest PT and flutter valve and incentive spirometry -Appreciate pulmonary recommendations and now they have signed off the case  Dysphagia -Patient has moderately severe oral pharyngeal dysphagia; today SLP evaluated and recommended n.p.o. 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 -Have an Unknown Baseline -BUN/Cr slowly improved from admission but is relatively stable the last few days and is now improving finally; BUN/creatinine has been ranging around 2 and is now improved with diuresis of this 45/1.55 -IV fluids were going when I pick the patient up and will stop them given her extreme volume overload and have started diuresis -Avoid further nephrotoxic medications, contrast dyes, hypotension and renally dose medications but will give her diuretics given her volume overload and had gotten them twice daily today and will give him IV again twice daily tomorrow -Repeat CMP in the a.m.  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   Hypothyroidism -Patient's TSH was 9.921 and free T4 0.67 -Her levothyroxine has been resumed at 112 mcg   Abnormal LFTs, improving -Still somewhat elevated but trending down from earlier on admission -LFTs are improving and trending down and AST has normalized and ALT is now 46;  -Liver Doppler done on 10.18 showed "Widely patent hepatic vasculature with normal directional flow. Nonspecific atrophy of the left lobe of the liver as demonstrated on preceding abdominal CT. No discrete hepatic lesions. Trace amount of perihepatic ascites and small/trace bilateral effusions as was demonstrated on preceding abdominal CT. Cholelithiasis without evidence of cholecystitis." -Continue monitor and trend and repeat CMP in a.m.  Thrombocytopenia -Patient's Platelet Count dropped to 5 and now slowly trending up as it has gone to 30 -> 11 -> 49 and trended back down to 15 and then 10 yesterday.  Will transfuse 2 more packs of platelets and is improved to 70 yesterday but dropped again so we'll transfuse 1 L pack of platelets today given that her platelet count was 80 -In the Setting of Septic Shock and her gram-negative bacteremia and I spoke with oncology who feels that this is secondary to her infection and not related to HUS TTP or ITP -She is transfused 5  packs of platelets total -There is low suspicion for HIT given the timing but Lovenox has been discontinued -Continue to Monitor for S/Sx of Bleeding; Currently no overt bleeding noted -Repeat CBC in the AM  Hypokalemia -Patient's K+ was 3.0 -Replete with p.o. KCl 40 mg x 2 and IV KCl -Check Mag Level but was 1.8 -Continue to Monitor and Replete as Necessary -Repeat  CMP  Normocytic anemia/anemia of critical illness -Patient's hemoglobin/mother went from 8.2/20.4 trended down to 7.9/26.0 today 7.0/22.9 and we will transfuse 1 unit PRBCs  -Now posttransfusion her hemoglobin/hematocrit is stable at 8.5/27.1 -we will give Lasix before and after next-continue to monitor for signs and symptoms of bleeding; currently no overt bleeding noted -Repeat CBC in a.m.  DVT prophylaxis: SCDs given her significant thrombocytopenia prohibiting pharmacological prophylaxis Code Status: DO NOT RESUSCITATE Family Communication: Spoke with the daughter at bedside Disposition Plan: Pending further clinical improvement back to baseline and tolerance of p.o. diet; she will need PT OT to further evaluate and treat  Status is: Inpatient  Remains inpatient appropriate because:Unsafe d/c plan, IV treatments appropriate due to intensity of illness or inability to take PO and Inpatient level of care appropriate due to severity of illness   Dispo: The patient is from: Home              Anticipated d/c is to: SNF              Anticipated d/c date is: 3 days              Patient currently is not medically stable to d/c.  Consultants:   PCCM Transfer  Palliative care medicine  Significant Diagnostic Tests:  CT H 10/17> chronic microvascular ischemic changes, mild atrophy related to age.  Micro Data:  10/17 SARS Cov2> neg 10/17 BCx> 1 of 2 staph species, contaminant, 2 of 2 GNRs 10/17 UCx> <10k CFU 10/17: MRSA screen negative Antimicrobials:  10/17 zosyn >10/18 10/18 cipro (GNR resistant to all but cipro, imipenem)>>  Echocardiogram IMPRESSIONS    1. Left ventricular ejection fraction, by estimation, is 55 to 60%. The  left ventricle has normal function. Left ventricular endocardial border  not optimally defined to evaluate regional wall motion. Left ventricular  diastolic parameters are consistent  with Grade II diastolic dysfunction (pseudonormalization).  Elevated left  ventricular end-diastolic pressure.  2. Right ventricular systolic function is normal. The right ventricular  size is normal. There is normal pulmonary artery systolic pressure. The  estimated right ventricular systolic pressure is 45.4 mmHg.  3. The mitral valve is grossly normal. No evidence of mitral valve  regurgitation. No evidence of mitral stenosis.  4. The aortic valve is abnormal. There is moderate calcification of the  aortic valve. Aortic valve regurgitation is not visualized. Moderate  aortic valve sclerosis/calcification is present, without any evidence of  aortic stenosis.  5. The inferior vena cava is normal in size with <50% respiratory  variability, suggesting right atrial pressure of 8 mmHg.   FINDINGS  Left Ventricle: Left ventricular ejection fraction, by estimation, is 55  to 60%. The left  ventricle has normal function. Left ventricular  endocardial border not optimally defined to evaluate regional wall motion.  The left ventricular internal cavity  size was normal in size. There is no left ventricular hypertrophy. Left  ventricular diastolic parameters are consistent with Grade II diastolic  dysfunction (pseudonormalization). Elevated left ventricular end-diastolic  pressure.   Right Ventricle: The right ventricular size is normal. No increase in  right ventricular wall thickness. Right ventricular systolic function is  normal. There is normal pulmonary artery systolic pressure. The tricuspid  regurgitant velocity is 2.36 m/s, and  with an assumed right atrial pressure of 8 mmHg, the estimated right  ventricular systolic pressure is 16.1 mmHg.   Left Atrium: Left atrial size was normal in size.   Right Atrium: Right atrial size was normal in size.   Pericardium: There is no evidence of pericardial effusion.   Mitral Valve: Mild chordal SAM. The mitral valve is grossly normal. Mild  mitral annular calcification. No evidence of mitral  valve regurgitation.  No evidence of mitral valve stenosis.   Tricuspid Valve: The tricuspid valve is normal in structure. Tricuspid  valve regurgitation is mild . No evidence of tricuspid stenosis.   Aortic Valve: The aortic valve is abnormal. There is moderate  calcification of the aortic valve. Aortic valve regurgitation is not  visualized. Mild to moderate aortic valve sclerosis/calcification is  present, without any evidence of aortic stenosis.   Pulmonic Valve: The pulmonic valve was grossly normal. Pulmonic valve  regurgitation is trivial. No evidence of pulmonic stenosis.   Aorta: The aortic root is normal in size and structure.   Venous: The inferior vena cava is normal in size with less than 50%  respiratory variability, suggesting right atrial pressure of 8 mmHg.   IAS/Shunts: No atrial level shunt detected by color flow Doppler.   Additional Comments: A venous catheter is visualized in the right atrium.     LEFT VENTRICLE  PLAX 2D  LVIDd:     4.20 cm Diastology  LVIDs:     2.50 cm LV e' medial:  3.68 cm/s  LV PW:     1.00 cm LV E/e' medial: 19.0  LV IVS:    0.80 cm LV e' lateral:  6.13 cm/s  LVOT diam:   2.00 cm LV E/e' lateral: 11.4  LV SV:     54  LV SV Index:  35  LVOT Area:   3.14 cm     RIGHT VENTRICLE  RV S prime:   11.20 cm/s  TAPSE (M-mode): 1.5 cm   LEFT ATRIUM       Index    RIGHT ATRIUM      Index  LA diam:    2.90 cm 1.88 cm/m RA Area:   15.00 cm  LA Vol (A2C):  50.7 ml 32.79 ml/m RA Volume:  34.20 ml 22.12 ml/m  LA Vol (A4C):  25.2 ml 16.30 ml/m  LA Biplane Vol: 39.6 ml 25.61 ml/m  AORTIC VALVE  LVOT Vmax:  90.50 cm/s  LVOT Vmean: 66.250 cm/s  LVOT VTI:  0.172 m    AORTA  Ao Root diam: 3.00 cm  Ao Asc diam: 2.70 cm   MITRAL VALVE        TRICUSPID VALVE  MV Area (PHT): 3.12 cm  TR Peak grad:  22.3 mmHg  MV Decel Time: 243 msec  TR Vmax:     236.00 cm/s  MV E velocity: 69.80 cm/s  MV A velocity: 69.00 cm/s SHUNTS  MV E/A  ratio: 1.01    Systemic VTI: 0.17 m               Systemic Diam: 2.00 cm   LE VENOUS DUPLEX Summary:  RIGHT:  - Findings consistent with chronic deep vein thrombosis involving the  visualized segments of the right peroneal veins.  - A cystic structure is found in the popliteal fossa.    LEFT:  - There is no evidence of deep vein thrombosis in the lower extremity.  However, portions of this examination were limited- see technologist  comments above.    *See table(s) above for measurements and observations.    Antimicrobials:  Anti-infectives (From admission, onward)   Start     Dose/Rate Route Frequency Ordered Stop   04/18/20 1600  levofloxacin (LEVAQUIN) IVPB 750 mg        750 mg 100 mL/hr over 90 Minutes Intravenous Every 48 hours 04/18/20 0951     04/17/20 2230  cefTRIAXone (ROCEPHIN) injection 1 g  Status:  Discontinued        1 g Intramuscular Every 24 hours 04/17/20 2144 04/18/20 0949   04/17/20 1400  levofloxacin (LEVAQUIN) IVPB 500 mg  Status:  Discontinued        500 mg 100 mL/hr over 60 Minutes Intravenous Every 24 hours 04/17/20 1157 04/17/20 2144   04/14/20 1600  ciprofloxacin (CIPRO) IVPB 400 mg  Status:  Discontinued        400 mg 200 mL/hr over 60 Minutes Intravenous Every 24 hours 04/14/20 1443 04/17/20 1157   04/13/20 2000  vancomycin (VANCOCIN) IVPB 1000 mg/200 mL premix        1,000 mg 200 mL/hr over 60 Minutes Intravenous  Once 04/13/20 1844 04/13/20 2111   04/13/20 1850  vancomycin variable dose per unstable renal function (pharmacist dosing)  Status:  Discontinued         Does not apply See admin instructions 04/13/20 1851 04/14/20 1003   04/13/20 1200  piperacillin-tazobactam (ZOSYN) IVPB 2.25 g  Status:  Discontinued        2.25 g 100 mL/hr over 30 Minutes Intravenous Every 8 hours 04/13/20 0639 04/14/20 1442   04/13/20 0230   piperacillin-tazobactam (ZOSYN) IVPB 3.375 g       Note to Pharmacy: Pharmacy to dose   3.375 g 100 mL/hr over 30 Minutes Intravenous  Once 04/13/20 0216 04/13/20 0342        Subjective: Seen and examined at bedside and again was confused and awake today but she was extremely agitated and wanted to go home and upset.  Denies any nausea or vomiting.  Wanted to be left alone.  Denies other concerns or complaints at this time and no family currently at bedside  Objective: Vitals:   04/21/20 0359 04/21/20 0545 04/21/20 0811 04/21/20 1323  BP: (!) 145/71 (!) 143/70  (!) 152/85  Pulse: 80 82  96  Resp: 18 18  16   Temp: 97.8 F (36.6 C) 97.9 F (36.6 C)  (!) 97.5 F (36.4 C)  TempSrc: Oral Oral  Oral  SpO2: 97% 95% 94%   Weight:      Height:        Intake/Output Summary (Last 24 hours) at 04/21/2020 1424 Last data filed at 04/21/2020 1029 Gross per 24 hour  Intake 708.46 ml  Output 2050 ml  Net -1341.54 ml   Filed Weights   04/18/20 0659 04/19/20 0500 04/20/20 0500  Weight: 55.3 kg 56.2 kg 54 kg   Examination: Physical Exam:  Constitutional: Patient  is a very thin and frail cachectic chronically ill-appearing Caucasian female currently in no acute distress but she is extremely agitated and is awake this morning Eyes: Lids and conjunctivae normal, sclerae anicteric  ENMT: External Ears, Nose appear normal. Grossly normal hearing.  Neck: Appears normal, supple, no cervical masses, normal ROM, no appreciable thyromegaly; mild JVD Respiratory: Diminished to auscultation bilaterally with coarse breath sounds and some rhonchi and crackles.  No appreciable wheezing but she is wearing supplemental oxygen via nasal cannula Cardiovascular: RRR, no murmurs / rubs / gallops. S1 and S2 auscultated.  Legs are wrapped bilaterally but does have some pitting edema with some weeping Abdomen: Soft, non-tender, non-distended. Bowel sounds positive.  GU: Deferred. Musculoskeletal: No clubbing /  cyanosis of digits/nails. No joint deformity upper and lower extremities.  Skin: Does have some petechiae and some ecchymosis diffusely noted.  Her right arm is erythematous and has some bruising. Neurologic: CN 2-12 grossly intact with no focal deficits. Romberg sign and cerebellar reflexes not assessed.  Psychiatric: Impaired judgment and insight.  She is awake and alert but not fully oriented and she is agitated and anxious. Normal mood and appropriate affect.   Data Reviewed: I have personally reviewed following labs and imaging studies  CBC: Recent Labs  Lab 04/16/20 1120 04/17/20 0614 04/18/20 0439 04/19/20 0430 04/19/20 1330 04/20/20 0446 04/21/20 0850  WBC 24.2*   < > 11.2* 7.9 9.2 10.6* 10.6*  NEUTROABS 22.0*  --   --  6.4 7.5 9.0* 9.3*  HGB 8.8*   < > 7.3* 8.2* 7.9* 7.0* 8.5*  HCT 27.2*   < > 23.3* 27.4* 26.0* 22.9* 27.1*  MCV 78.4*   < > 80.1 86.2 85.0 82.4 85.2  PLT 30*   < > 49* 15* 10* 70* 18*   < > = values in this interval not displayed.   Basic Metabolic Panel: Recent Labs  Lab 04/17/20 0614 04/18/20 0439 04/19/20 0430 04/20/20 0446 04/21/20 0850  NA 136 139 142 140 144  K 3.4* 3.4* 4.3 3.4* 3.0*  CL 99 101 102 101 104  CO2 25 27 27 27 27   GLUCOSE 90 119* 100* 100* 109*  BUN 44* 46* 40* 51* 45*  CREATININE 1.99* 2.01* 1.83* 1.90* 1.55*  CALCIUM 7.3* 7.2* 7.5* 7.3* 7.0*  MG  --  2.2 2.2 1.9 1.8  PHOS  --   --  6.3* 5.2* 3.7   GFR: Estimated Creatinine Clearance: 26.7 mL/min (A) (by C-G formula based on SCr of 1.55 mg/dL (H)). Liver Function Tests: Recent Labs  Lab 04/14/20 2057 04/18/20 0439 04/19/20 0430 04/20/20 0446 04/21/20 0850  AST 145* 67* 52* 41 38  ALT 49* 65* 65* 52* 46*  ALKPHOS 126 283* 270* 380* 398*  BILITOT 1.2 1.5* 1.0 1.2 0.9  PROT 5.0* 4.5* 4.8* 5.0* 4.9*  ALBUMIN 3.0* 1.9* 1.9* 2.0* 1.9*   No results for input(s): LIPASE, AMYLASE in the last 168 hours. No results for input(s): AMMONIA in the last 168 hours. Coagulation  Profile: Recent Labs  Lab 04/14/20 2316  INR 1.6*   Cardiac Enzymes: No results for input(s): CKTOTAL, CKMB, CKMBINDEX, TROPONINI in the last 168 hours. BNP (last 3 results) No results for input(s): PROBNP in the last 8760 hours. HbA1C: No results for input(s): HGBA1C in the last 72 hours. CBG: Recent Labs  Lab 04/20/20 2020 04/21/20 0121 04/21/20 0400 04/21/20 0739 04/21/20 1136  GLUCAP 85 86 139* 110* 101*   Lipid Profile: No results for input(s): CHOL, HDL, LDLCALC,  TRIG, CHOLHDL, LDLDIRECT in the last 72 hours. Thyroid Function Tests: No results for input(s): TSH, T4TOTAL, FREET4, T3FREE, THYROIDAB in the last 72 hours. Anemia Panel: No results for input(s): VITAMINB12, FOLATE, FERRITIN, TIBC, IRON, RETICCTPCT in the last 72 hours. Sepsis Labs: Recent Labs  Lab 04/14/20 2057 04/14/20 2316 04/15/20 0529  PROCALCITON  --   --  2.86  LATICACIDVEN 2.0* 1.6  --     Recent Results (from the past 240 hour(s))  Culture, blood (routine x 2)     Status: Abnormal   Collection Time: 04/12/20 11:45 PM   Specimen: BLOOD RIGHT ARM  Result Value Ref Range Status   Specimen Description BLOOD RIGHT ARM  Final   Special Requests   Final    BOTTLES DRAWN AEROBIC AND ANAEROBIC Blood Culture adequate volume   Culture  Setup Time   Final    GRAM NEGATIVE RODS GRAM POSITIVE COCCI IN CLUSTERS IN BOTH AEROBIC AND ANAEROBIC BOTTLES CRITICAL RESULT CALLED TO, READ BACK BY AND VERIFIED WITH: PHARMD E WILLIAMSON [id number] AR 1757 BY CM Performed at [facility], [place] [address], [place], [place] [address]    Culture (A)  Final    FLAVOBACTERIUM ODORATUM AEROCOCCUS VIRIDANS STAPHYLOCOCCUS XYLOSUS    Report Status 04/15/2020 FINAL  Final  Blood Culture ID Panel (Reflexed)     Status: Abnormal   Collection Time: 04/12/20 11:45 PM  Result Value Ref Range Status   Enterococcus faecalis NOT DETECTED NOT DETECTED Final   Enterococcus Faecium NOT DETECTED NOT DETECTED Final   Listeria  monocytogenes NOT DETECTED NOT DETECTED Final   Staphylococcus species DETECTED (A) NOT DETECTED Final    Comment: CRITICAL RESULT CALLED TO, READ BACK BY AND VERIFIED WITH: PHARMD E WILLIAMSON [id number] AT 1757 BY CM    Staphylococcus aureus (BCID) NOT DETECTED NOT DETECTED Final   Staphylococcus epidermidis NOT DETECTED NOT DETECTED Final   Staphylococcus lugdunensis NOT DETECTED NOT DETECTED Final   Streptococcus species NOT DETECTED NOT DETECTED Final   Streptococcus agalactiae NOT DETECTED NOT DETECTED Final   Streptococcus pneumoniae NOT DETECTED NOT DETECTED Final   Streptococcus pyogenes NOT DETECTED NOT DETECTED Final   A.calcoaceticus-baumannii NOT DETECTED NOT DETECTED Final   Bacteroides fragilis NOT DETECTED NOT DETECTED Final   Enterobacterales NOT DETECTED NOT DETECTED Final   Enterobacter cloacae complex NOT DETECTED NOT DETECTED Final   Escherichia coli NOT DETECTED NOT DETECTED Final   Klebsiella aerogenes NOT DETECTED NOT DETECTED Final   Klebsiella oxytoca NOT DETECTED NOT DETECTED Final   Klebsiella pneumoniae NOT DETECTED NOT DETECTED Final   Proteus species NOT DETECTED NOT DETECTED Final   Salmonella species NOT DETECTED NOT DETECTED Final   Serratia marcescens NOT DETECTED NOT DETECTED Final   Haemophilus influenzae NOT DETECTED NOT DETECTED Final   Neisseria meningitidis NOT DETECTED NOT DETECTED Final   Pseudomonas aeruginosa NOT DETECTED NOT DETECTED Final   Stenotrophomonas maltophilia NOT DETECTED NOT DETECTED Final   Candida albicans NOT DETECTED NOT DETECTED Final   Candida auris NOT DETECTED NOT DETECTED Final   Candida glabrata NOT DETECTED NOT DETECTED Final   Candida krusei NOT DETECTED NOT DETECTED Final   Candida parapsilosis NOT DETECTED NOT DETECTED Final   Candida tropicalis NOT DETECTED NOT DETECTED Final   Cryptococcus neoformans/gattii NOT DETECTED NOT DETECTED Final    Comment: Performed at [facility], [address],  [place], [place] [address]  Respiratory Panel by RT PCR (Flu A&B, Covid) - Nasopharyngeal Swab  Status: None   Collection Time: 04/13/20 12:13 AM   Specimen: Nasopharyngeal Swab  Result Value Ref Range Status   SARS Coronavirus 2 by RT PCR NEGATIVE NEGATIVE Final    Comment: (NOTE) SARS-CoV-2 target nucleic acids are NOT DETECTED.  The SARS-CoV-2 RNA is generally detectable in upper respiratoy specimens during the acute phase of infection. The lowest concentration of SARS-CoV-2 viral copies this assay can detect is 131 copies/mL. A negative result does not preclude SARS-Cov-2 infection and should not be used as the sole basis for treatment or other patient management decisions. A negative result may occur with  improper specimen collection/handling, submission of specimen other than nasopharyngeal swab, presence of viral mutation(s) within the areas targeted by this assay, and inadequate number of viral copies (<131 copies/mL). A negative result must be combined with clinical observations, patient history, and epidemiological information. The expected result is Negative.  Fact Sheet for Patients:  PinkCheek.be  Fact Sheet for Healthcare Providers:  GravelBags.it  This test is no t yet approved or cleared by the [place] FDA and  has been authorized for detection and/or diagnosis of SARS-CoV-2 by FDA under an Emergency Use Authorization (EUA). This EUA will remain  in effect (meaning this test can be used) for the duration of the COVID-19 declaration under Section 564(b)(1) of the Act, 21 U.S.C. section 360bbb-3(b)(1), unless the authorization is terminated or revoked sooner.     Influenza A by PCR NEGATIVE NEGATIVE Final   Influenza B by PCR NEGATIVE NEGATIVE Final    Comment: (NOTE) The Xpert Xpress SARS-CoV-2/FLU/RSV assay is intended as an aid in  the diagnosis of influenza from Nasopharyngeal swab specimens and   should not be used as a sole basis for treatment. Nasal washings and  aspirates are unacceptable for Xpert Xpress SARS-CoV-2/FLU/RSV  testing.  Fact Sheet for Patients: PinkCheek.be  Fact Sheet for Healthcare Providers: GravelBags.it  This test is not yet approved or cleared by the [place] FDA and  has been authorized for detection and/or diagnosis of SARS-CoV-2 by  FDA under an Emergency Use Authorization (EUA). This EUA will remain  in effect (meaning this test can be used) for the duration of the  Covid-19 declaration under Section 564(b)(1) of the Act, 21  U.S.C. section 360bbb-3(b)(1), unless the authorization is  terminated or revoked. Performed at [facility], [place] [address] [place], [place] [address]   Culture, blood (routine x 2)     Status: Abnormal   Collection Time: 04/13/20  1:18 AM   Specimen: BLOOD RIGHT HAND  Result Value Ref Range Status   Specimen Description BLOOD RIGHT HAND  Final   Special Requests   Final    BOTTLES DRAWN AEROBIC ONLY Blood Culture adequate volume   Culture  Setup Time   Final    GRAM NEGATIVE RODS AEROBIC BOTTLE ONLY CRITICAL RESULT CALLED TO, READ BACK BY AND VERIFIED WITH: PHARMD S CHRISTY [id number] AT 1820 BY CM Performed at [facility], [place] [address] [place], [place] [address]    Culture FLAVOBACTERIUM ODORATUM (A)  Final   Report Status 04/15/2020 FINAL  Final   Organism ID, Bacteria FLAVOBACTERIUM ODORATUM  Final      Susceptibility   Flavobacterium odoratum - MIC*    CEFAZOLIN >=64 RESISTANT Resistant     CEFTAZIDIME >=64 RESISTANT Resistant     CIPROFLOXACIN 1 SENSITIVE Sensitive     GENTAMICIN >=16 RESISTANT Resistant     IMIPENEM 4 SENSITIVE Sensitive  TRIMETH/SULFA >=320 RESISTANT Resistant     PIP/TAZO 32 INTERMEDIATE Intermediate     * FLAVOBACTERIUM ODORATUM  Urine culture     Status: Abnormal   Collection Time: 04/13/20  6:18 AM    Specimen: Urine, Catheterized  Result Value Ref Range Status   Specimen Description URINE, CATHETERIZED  Final   Special Requests NONE  Final   Culture (A)  Final    <10,000 COLONIES/mL INSIGNIFICANT GROWTH Performed at [facility], [place] [address] [place], [place] [address]    Report Status 04/14/2020 FINAL  Final  MRSA PCR Screening     Status: None   Collection Time: 04/13/20 10:33 AM   Specimen: Nasopharyngeal  Result Value Ref Range Status   MRSA by PCR NEGATIVE NEGATIVE Final    Comment:        The GeneXpert MRSA Assay (FDA approved for NASAL specimens only), is one component of a comprehensive MRSA colonization surveillance program. It is not intended to diagnose MRSA infection nor to guide or monitor treatment for MRSA infections. Performed at [facility], [place] [address] [place], [place] [address]   Gastrointestinal Panel by PCR , Stool     Status: None   Collection Time: 04/13/20  2:36 PM   Specimen: Stool  Result Value Ref Range Status   Campylobacter species NOT DETECTED NOT DETECTED Final   Plesimonas shigelloides NOT DETECTED NOT DETECTED Final   Salmonella species NOT DETECTED NOT DETECTED Final   Yersinia enterocolitica NOT DETECTED NOT DETECTED Final   Vibrio species NOT DETECTED NOT DETECTED Final   Vibrio cholerae NOT DETECTED NOT DETECTED Final   Enteroaggregative E coli (EAEC) NOT DETECTED NOT DETECTED Final   Enteropathogenic E coli (EPEC) NOT DETECTED NOT DETECTED Final   Enterotoxigenic E coli (ETEC) NOT DETECTED NOT DETECTED Final   Shiga like toxin producing E coli (STEC) NOT DETECTED NOT DETECTED Final   Shigella/Enteroinvasive E coli (EIEC) NOT DETECTED NOT DETECTED Final   Cryptosporidium NOT DETECTED NOT DETECTED Final   Cyclospora cayetanensis NOT DETECTED NOT DETECTED Final   Entamoeba histolytica NOT DETECTED NOT DETECTED Final   Giardia lamblia NOT DETECTED NOT DETECTED Final   Adenovirus F40/41 NOT DETECTED NOT  DETECTED Final   Astrovirus NOT DETECTED NOT DETECTED Final   Norovirus GI/GII NOT DETECTED NOT DETECTED Final   Rotavirus A NOT DETECTED NOT DETECTED Final   Sapovirus (I, II, IV, and V) NOT DETECTED NOT DETECTED Final    Comment: Performed at [facility], [place] [place], [place] [address]  C Difficile Quick Screen w PCR reflex     Status: None   Collection Time: 04/14/20 11:40 AM   Specimen: Stool  Result Value Ref Range Status   C Diff antigen NEGATIVE NEGATIVE Final   C Diff toxin NEGATIVE NEGATIVE Final   C Diff interpretation No C. difficile detected.  Final    Comment: Performed at [facility], [place] [address], [place],  [address]     RN Pressure Injury Documentation: Pressure Injury 04/13/20 Heel Right Deep Tissue Pressure Injury - Purple or maroon localized area of discolored intact skin or blood-filled blister due to damage of underlying soft tissue from pressure and/or shear. spanning entire sole of foot (Active)  04/13/20 1030  Location: Heel  Location Orientation: Right  Staging: Deep Tissue Pressure Injury - Purple or maroon localized area of discolored intact skin or blood-filled blister due to damage of underlying soft tissue from pressure and/or shear.  Wound Description (Comments): spanning entire sole of foot  Present on Admission: Yes     Pressure Injury 04/13/20 Heel Left Deep Tissue Pressure Injury - Purple or maroon localized area of discolored intact skin or blood-filled blister due to damage of underlying soft tissue from pressure and/or shear. (Active)  04/13/20 1030  Location: Heel  Location Orientation: Left  Staging: Deep Tissue Pressure Injury - Purple or maroon localized area of discolored intact skin or blood-filled blister due to damage of underlying soft tissue from pressure and/or shear.  Wound Description (Comments):   Present on Admission: Yes     Pressure Injury 04/13/20 Sacrum Stage 3 -  Full  thickness tissue loss. Subcutaneous fat may be visible but bone, tendon or muscle are NOT exposed. deep tissue injury surrounding stg 3 (Active)  04/13/20 1030  Location: Sacrum  Location Orientation:   Staging: Stage 3 -  Full thickness tissue loss. Subcutaneous fat may be visible but bone, tendon or muscle are NOT exposed.  Wound Description (Comments): deep tissue injury surrounding stg 3  Present on Admission: Yes     Pressure Injury 04/13/20 Knee Anterior;Left Deep Tissue Pressure Injury - Purple or maroon localized area of discolored intact skin or blood-filled blister due to damage of underlying soft tissue from pressure and/or shear. (Active)  04/13/20 1030  Location: Knee  Location Orientation: Anterior;Left  Staging: Deep Tissue Pressure Injury - Purple or maroon localized area of discolored intact skin or blood-filled blister due to damage of underlying soft tissue from pressure and/or shear.  Wound Description (Comments):   Present on Admission: Yes    Estimated body mass index is 19.82 kg/m as calculated from the following:   Height as of this encounter: 5\' 5"  (1.651 m).   Weight as of this encounter: 54 kg.  Malnutrition Type:  Nutrition Problem: Inadequate oral intake Etiology: lethargy/confusion Malnutrition Characteristics:  Signs/Symptoms: meal completion < 25%  Nutrition Interventions:  Interventions: Ensure Enlive (each supplement provides 350kcal and 20 grams of protein), MVI, Magic cup Radiology Studies: DG CHEST PORT 1 VIEW  Result Date: 04/21/2020 CLINICAL DATA:  Shortness of breath. EXAM: PORTABLE CHEST 1 VIEW COMPARISON:  April 20, 2020. FINDINGS: Stable cardiomediastinal silhouette. Stable bilateral interstitial densities are noted concerning for pulmonary edema. Stable bilateral pleural effusions are noted, left greater than right. No pneumothorax is noted. Right-sided PICC line is unchanged in position. Bony thorax is unremarkable. IMPRESSION: Stable  bilateral pulmonary edema and pleural effusions. Electronically Signed   By: Marijo Conception M.D.   On: 04/21/2020 09:07   DG CHEST PORT 1 VIEW  Result Date: 04/20/2020 CLINICAL DATA:  Shortness of breath EXAM: PORTABLE CHEST 1 VIEW COMPARISON:  Chest x-ray dated 04/19/2020. FINDINGS: Improved aeration of the LEFT upper lung. Persistent layering pleural effusion on the LEFT, moderate to large in size. Stable interstitial opacities within the upper and lower zones of the RIGHT lung. Probable small RIGHT pleural effusion. No pneumothorax is seen. Heart size and mediastinal contours are grossly stable. IMPRESSION: 1. Patchy interstitial/ground-glass opacities bilaterally, edema versus multifocal pneumonia. 2. Improved aeration of the LEFT upper lung. 3. Persistent layering pleural effusion on the LEFT, moderate to large in size. 4. Probable small RIGHT pleural effusion. Electronically Signed   By: Franki Cabot M.D.   On: 04/20/2020 06:27   Scheduled Meds: . sodium chloride   Intravenous Once  . Chlorhexidine Gluconate Cloth  6 each Topical Daily  . collagenase   Topical Daily  . feeding supplement  237  mL Oral BID BM  . furosemide  40 mg Intravenous BID  . guaiFENesin  1,200 mg Oral BID  . ipratropium  0.5 mg Nebulization TID  . levalbuterol  0.63 mg Nebulization TID  . levothyroxine  112 mcg Oral Q0600  . mouth rinse  15 mL Mouth Rinse BID  . potassium chloride  40 mEq Oral BID  . sodium chloride flush  10-40 mL Intracatheter Q12H   Continuous Infusions: . sodium chloride 250 mL (04/13/20 0414)  . levofloxacin (LEVAQUIN) IV 750 mg (04/20/20 1706)  . potassium chloride 10 mEq (04/21/20 1322)    LOS: 8 days   Kerney Elbe, DO [facility] PAGER is on [place]  If 7PM-7AM, please contact night-coverage [email or web address]

## 2020-04-21 NOTE — Progress Notes (Signed)
CRITICAL VALUE ALERT  Critical Value:  PLT 18  Date & Time Notied:  04/21/2020 1001  Provider Notified: Dr. Alfredia Ferguson  Orders Received/Actions taken: new orders received for 1 unit of platelets to be transfused

## 2020-04-21 NOTE — Progress Notes (Signed)
  Speech Language Pathology Treatment: Dysphagia  Patient Details Name: Michelle Avila MRN: [id number] DOB: 1945-08-04 Today's Date: 04/21/2020 Time: 4098-1191 SLP Time Calculation (min) (ACUTE ONLY): 20 min  Assessment / Plan / Recommendation Clinical Impression  Pt seen at bedside for follow up after MBS completed Friday 04/18/20. Session was limited due to pt's lack of cooperation. RN reports pt accepted approximately half of her magic cup, and has refused additional PO trials. SLP encouraged pt to eat some of her lunch, but pt adamantly refused. When asked what she could like, pt stated "a diet coke". SLP thickened diet cola and observed pt drinking it. No overt s/s aspiration observed during trials. Safe swallow precautions posted in pt room. SLP will continue to follow for education, hopefully when pt's daughter is present.   HPI HPI: Pt presented with AMS, was found down covered in feces at home by her neighbors.  PMH for smoking, Pneumonitis due to inhalation of food and vomit in 2016, Pressure ulcer of sacral region, stage 1, Acute respiratory failure with hypoxia, protein-calorie malnutrition, bacterial pneumonia, was living alone, Diarrhea, Fluid overload, Altered mental status, Klebsiella pneumoniae (k. pneumoniae), Pure hypercholesterolemia, Essential (primary) hypertension, Hypotension, Tobacco use, bilirubin metabolism, Escherichia coli (E. coli), , Hypomagnesemia, Hypokalemia, Anemia, unspecified, Body mass index (BMI) 19.9 or less, adult malaise.      SLP Plan  Continue with current plan of care       Recommendations  Diet recommendations: Dysphagia 2 (fine chop);Nectar-thick liquid Liquids provided via: Cup;Straw Medication Administration: Whole meds with puree Supervision: Patient able to self feed;Staff to assist with self feeding Compensations: Slow rate;Small sips/bites;Chin tuck;Use straw to facilitate chin tuck Postural Changes and/or Swallow Maneuvers: Seated  upright 90 degrees;Upright 30-60 min after meal                Oral Care Recommendations: Oral care QID Follow up Recommendations: Skilled Nursing facility;24 hour supervision/assistance SLP Visit Diagnosis: Dysphagia, pharyngoesophageal phase (R13.14);Dysphagia, oropharyngeal phase (R13.12) Plan: Continue with current plan of care       Havana B. Quentin Ore, [facility], [place] Speech Language Pathologist Office: [phone] Pager: [phone]  [name] 04/21/2020, 2:41 PM

## 2020-04-21 NOTE — Care Management Important Message (Signed)
Important Message  Patient Details IM Letter given to the Patient Name: Michelle Avila MRN: [id number] Date of Birth: 03/11/1946   Medicare Important Message Given:  Yes     Kerin Salen 04/21/2020, 12:41 PM

## 2020-04-22 ENCOUNTER — Inpatient Hospital Stay (HOSPITAL_COMMUNITY): Payer: Medicare Other

## 2020-04-22 DIAGNOSIS — Z7189 Other specified counseling: Secondary | ICD-10-CM | POA: Diagnosis not present

## 2020-04-22 DIAGNOSIS — R531 Weakness: Secondary | ICD-10-CM | POA: Diagnosis not present

## 2020-04-22 DIAGNOSIS — A419 Sepsis, unspecified organism: Secondary | ICD-10-CM | POA: Diagnosis not present

## 2020-04-22 DIAGNOSIS — R7881 Bacteremia: Secondary | ICD-10-CM | POA: Diagnosis not present

## 2020-04-22 DIAGNOSIS — Z515 Encounter for palliative care: Secondary | ICD-10-CM | POA: Diagnosis not present

## 2020-04-22 DIAGNOSIS — E860 Dehydration: Secondary | ICD-10-CM | POA: Diagnosis not present

## 2020-04-22 DIAGNOSIS — R579 Shock, unspecified: Secondary | ICD-10-CM | POA: Diagnosis not present

## 2020-04-22 LAB — BPAM PLATELET PHERESIS
Blood Product Expiration Date: 202110262359
ISSUE DATE / TIME: 202110251446
Unit Type and Rh: 5100

## 2020-04-22 LAB — CBC WITH DIFFERENTIAL/PLATELET
Abs Immature Granulocytes: 0.09 10*3/uL — ABNORMAL HIGH (ref 0.00–0.07)
Basophils Absolute: 0 10*3/uL (ref 0.0–0.1)
Basophils Relative: 0 %
Eosinophils Absolute: 0 10*3/uL (ref 0.0–0.5)
Eosinophils Relative: 0 %
HCT: 26 % — ABNORMAL LOW (ref 36.0–46.0)
Hemoglobin: 8.1 g/dL — ABNORMAL LOW (ref 12.0–15.0)
Immature Granulocytes: 1 %
Lymphocytes Relative: 6 %
Lymphs Abs: 0.6 10*3/uL — ABNORMAL LOW (ref 0.7–4.0)
MCH: 27 pg (ref 26.0–34.0)
MCHC: 31.2 g/dL (ref 30.0–36.0)
MCV: 86.7 fL (ref 80.0–100.0)
Monocytes Absolute: 0.5 10*3/uL (ref 0.1–1.0)
Monocytes Relative: 4 %
Neutro Abs: 9.6 10*3/uL — ABNORMAL HIGH (ref 1.7–7.7)
Neutrophils Relative %: 89 %
Platelets: 29 10*3/uL — CL (ref 150–400)
RBC: 3 MIL/uL — ABNORMAL LOW (ref 3.87–5.11)
RDW: 24.2 % — ABNORMAL HIGH (ref 11.5–15.5)
WBC: 10.8 10*3/uL — ABNORMAL HIGH (ref 4.0–10.5)
nRBC: 0 % (ref 0.0–0.2)

## 2020-04-22 LAB — COMPREHENSIVE METABOLIC PANEL
ALT: 43 U/L (ref 0–44)
AST: 38 U/L (ref 15–41)
Albumin: 1.9 g/dL — ABNORMAL LOW (ref 3.5–5.0)
Alkaline Phosphatase: 355 U/L — ABNORMAL HIGH (ref 38–126)
Anion gap: 9 (ref 5–15)
BUN: 38 mg/dL — ABNORMAL HIGH (ref 8–23)
CO2: 33 mmol/L — ABNORMAL HIGH (ref 22–32)
Calcium: 7.3 mg/dL — ABNORMAL LOW (ref 8.9–10.3)
Chloride: 104 mmol/L (ref 98–111)
Creatinine, Ser: 1.19 mg/dL — ABNORMAL HIGH (ref 0.44–1.00)
GFR, Estimated: 48 mL/min — ABNORMAL LOW (ref >60)
Glucose, Bld: 101 mg/dL — ABNORMAL HIGH (ref 70–99)
Potassium: 3.6 mmol/L (ref 3.5–5.1)
Sodium: 146 mmol/L — ABNORMAL HIGH (ref 135–145)
Total Bilirubin: 1 mg/dL (ref 0.3–1.2)
Total Protein: 5 g/dL — ABNORMAL LOW (ref 6.5–8.1)

## 2020-04-22 LAB — PREPARE PLATELET PHERESIS: Unit division: 0

## 2020-04-22 LAB — MAGNESIUM: Magnesium: 2.1 mg/dL (ref 1.7–2.4)

## 2020-04-22 LAB — GLUCOSE, CAPILLARY
Glucose-Capillary: 96 mg/dL (ref 70–99)
Glucose-Capillary: 80 mg/dL (ref 70–99)
Glucose-Capillary: 99 mg/dL (ref 70–99)
Glucose-Capillary: 119 mg/dL — ABNORMAL HIGH (ref 70–99)
Glucose-Capillary: 106 mg/dL — ABNORMAL HIGH (ref 70–99)

## 2020-04-22 LAB — PHOSPHORUS: Phosphorus: 3.9 mg/dL (ref 2.5–4.6)

## 2020-04-22 NOTE — TOC Progression Note (Signed)
Transition of Care [facility]) - Progression Note    Patient Details  Name: IONIA SCHEY MRN: [id number] Date of Birth: [birth year]  [facility] CM/SW Contact  Ross Ludwig, [place] Phone Number: 04/22/2020, 4:36 PM  Clinical Narrative:     CSW spoke to patient's daughter and explained SNF bed search process.  Patient's daughter gave CSW permission to begin bed search in [place] and [place].  CSW faxed updated clinicals to different facilities awaiting bed offers.  Patient is [facility] Medicare and will need insurance auth prior to discharge.  CSW to begin insurance authorization once patient is medically ready for discharge per physician.  CSW to continue to follow patinet's progress throughout discharge planning.   Expected Discharge Plan: Home/Self Care Barriers to Discharge: Barriers Unresolved (comment)  Expected Discharge Plan and Services Expected Discharge Plan: Home/Self Care   Discharge Planning Services: CM Consult   Living arrangements for the past 2 months: Single Family Home                                       Social Determinants of Health (SDOH) Interventions    Readmission Risk Interventions No flowsheet data found.

## 2020-04-22 NOTE — NC FL2 (Signed)
[place] LEVEL OF CARE SCREENING TOOL     IDENTIFICATION  Patient Name: Michelle Avila Birthdate: [birth year] Sex: female Admission Date (Current Location): 04/12/2020  [facility] and [place] Number:  [place] and Address:  [facility],  [place], [place]      Provider Number: [id number]  Attending Physician Name and Address:  Kerney Elbe, DO  Relative Name and Phone Number:  Lawrence Santiago Daughter [phone]    Current Level of Care: Hospital Recommended Level of Care: [place] Prior Approval Number:    Date Approved/Denied:   PASRR Number: [id number]  Discharge Plan: SNF    Current Diagnoses: Patient Active Problem List   Diagnosis Date Noted  . Pulmonary infiltrates on CXR 04/19/2020  . Altered mental status   . Thrombocytopenia ([place])   . Dehydration 04/13/2020  . Shock ([place]) 04/13/2020  . Septic shock ([place]) 04/13/2020  . Pressure injury of skin 04/13/2020    Orientation RESPIRATION BLADDER Height & Weight     Self  O2 (5L) Incontinent Weight: 112 lb 14.4 oz (51.2 kg) Height:  5\' 5"  (165.1 cm)  BEHAVIORAL SYMPTOMS/MOOD NEUROLOGICAL BOWEL NUTRITION STATUS      Continent Diet (Dysphagia 2)  AMBULATORY STATUS COMMUNICATION OF NEEDS Skin   Limited Assist Verbally PU Stage and Appropriate Care     PU Stage 3 Dressing:  (PRN dressing change)                 Personal Care Assistance Level of Assistance  Bathing, Feeding, Dressing Bathing Assistance: Limited assistance Feeding assistance: Independent Dressing Assistance: Limited assistance     Functional Limitations Info  Sight, Hearing, Speech Sight Info: Adequate Hearing Info: Adequate Speech Info: Adequate    SPECIAL CARE FACTORS FREQUENCY  PT (By licensed PT), OT (By licensed OT)     PT Frequency: Minimum 5x a week OT Frequency: Minimum 5x a week            Contractures  Contractures Info: Not present    Additional Factors Info  Code Status, Allergies Code Status Info: DNR Allergies Info: NA           Current Medications (04/22/2020):  This is the current hospital active medication list Current Facility-Administered Medications  Medication Dose Route Frequency Provider Last Rate Last Admin  . 0.9 %  sodium chloride infusion (Manually program via Guardrails IV Fluids)   Intravenous Once [place], Omair Latif, DO      . 0.9 %  sodium chloride infusion  250 mL Intravenous Continuous Omar Person, NP 20 mL/hr at 04/13/20 0414 250 mL at 04/13/20 0414  . acetaminophen (TYLENOL) tablet 650 mg  650 mg Oral Q6H PRN Raiford Noble [place], DO      . Chlorhexidine Gluconate Cloth 2 % PADS 6 each  6 each Topical Daily Omar Person, NP   6 each at 04/22/20 1050  . collagenase (SANTYL) ointment   Topical Daily Omar Person, NP   1 application at 29/52/84 1050  . docusate sodium (COLACE) capsule 100 mg  100 mg Oral BID PRN Omar Person, NP      . feeding supplement (ENSURE ENLIVE / ENSURE PLUS) liquid 237 mL  237 mL Oral BID BM Sheikh, Omair Latif, DO   237 mL at 04/20/20 1633  . furosemide (LASIX) injection 40 mg  40 mg Intravenous BID Sheikh, Omair Latif, DO   40 mg at 04/22/20 1030  .  guaiFENesin (MUCINEX) 12 hr tablet 1,200 mg  1,200 mg Oral BID Raiford Noble Latif, DO   1,200 mg at 04/22/20 1030  . ipratropium (ATROVENT) nebulizer solution 0.5 mg  0.5 mg Nebulization TID Raiford Noble Latif, DO   0.5 mg at 04/21/20 8889  . levalbuterol (XOPENEX) nebulizer solution 0.63 mg  0.63 mg Nebulization TID Raiford Noble [place], DO   0.63 mg at 04/21/20 1694  . levofloxacin (LEVAQUIN) IVPB 750 mg  750 mg Intravenous Q48H SheikhGeorgina Quint [place], DO 100 mL/hr at 04/20/20 1706 750 mg at 04/20/20 1706  . levothyroxine (SYNTHROID) tablet 112 mcg  112 mcg Oral Q0600 Omar Person, NP   112 mcg at 04/22/20 0528  . MEDLINE mouth rinse  15 mL Mouth Rinse BID  Omar Person, NP   15 mL at 04/21/20 2114  . ondansetron (ZOFRAN) injection 4 mg  4 mg Intravenous Q6H PRN Omar Person, NP      . polyethylene glycol (MIRALAX / GLYCOLAX) packet 17 g  17 g Oral Daily PRN Omar Person, NP      . potassium chloride 20 MEQ/15ML (10%) solution 40 mEq  40 mEq Oral BID Raiford Noble [place], DO   40 mEq at 04/21/20 2114  . Resource ThickenUp Clear   Oral PRN Raiford Noble Latif, DO      . sodium chloride flush (NS) 0.9 % injection 10-40 mL  10-40 mL Intracatheter Q12H Omar Person, NP   10 mL at 04/22/20 1051  . sodium chloride flush (NS) 0.9 % injection 10-40 mL  10-40 mL Intracatheter PRN Omar Person, NP   10 mL at 04/18/20 0440     Discharge Medications: Please see discharge summary for a list of discharge medications.  Relevant Imaging Results:  Relevant Lab Results:   Additional Information SSN [id number]  Ross Ludwig, LCSW

## 2020-04-22 NOTE — Progress Notes (Signed)
Daily Progress Note   Patient Name: Michelle Avila       Date: 04/22/2020 DOB: 21-Jul-1945  Age: 74 y.o. MRN#: [id number] Attending Physician: Kerney Elbe, DO Primary Care Physician: Street, Sharon Mt, MD Admit Date: 04/12/2020  Reason for Consultation/Follow-up: Establishing goals of care  Subjective: Patient is awake alert resting in bed, she tracks me in the room, she is sitting up in bed. Patient simply does not verbalize with me, does not even nod in gestures. She appears to be in no distress.   Chart reviewed, I called and discussed with her daughter, see below.     Length of Stay: 9  Current Medications: Scheduled Meds:  . sodium chloride   Intravenous Once  . Chlorhexidine Gluconate Cloth  6 each Topical Daily  . collagenase   Topical Daily  . feeding supplement  237 mL Oral BID BM  . furosemide  40 mg Intravenous BID  . guaiFENesin  1,200 mg Oral BID  . ipratropium  0.5 mg Nebulization TID  . levalbuterol  0.63 mg Nebulization TID  . levothyroxine  112 mcg Oral Q0600  . mouth rinse  15 mL Mouth Rinse BID  . potassium chloride  40 mEq Oral BID  . sodium chloride flush  10-40 mL Intracatheter Q12H    Continuous Infusions: . sodium chloride 250 mL (04/13/20 0414)  . levofloxacin (LEVAQUIN) IV 750 mg (04/20/20 1706)    PRN Meds: acetaminophen, docusate sodium, ondansetron (ZOFRAN) IV, polyethylene glycol, Resource ThickenUp Clear, sodium chloride flush  Physical Exam    General: Alert, awake, in no acute distress.    Frail and chronically ill appearing.  Regular work of breathing Doesn't verbalize with me Has edema Abdomen not distended.         Vital Signs: BP (!) 143/69 (BP Location: Left Arm)   Pulse 96   Temp 97.6 F (36.4 C)   Resp 14    Ht 5\' 5"  (1.651 m)   Wt 51.2 kg   SpO2 96%   BMI 18.79 kg/m  SpO2: SpO2: 96 % O2 Device: O2 Device: Nasal Cannula O2 Flow Rate: O2 Flow Rate (L/min): 5 L/min  Intake/output summary:   Intake/Output Summary (Last 24 hours) at 04/22/2020 1443 Last data filed at 04/22/2020 1300 Gross per 24 hour  Intake 718.93 ml  Output  1850 ml  Net -1131.07 ml   LBM: Last BM Date: 04/22/20 Baseline Weight: Weight: 45.4 kg Most recent weight: Weight: 51.2 kg       Palliative Assessment/Data:      Patient Active Problem List   Diagnosis Date Noted  . Pulmonary infiltrates on CXR 04/19/2020  . Altered mental status   . Thrombocytopenia ([place])   . Dehydration 04/13/2020  . Shock ([place]) 04/13/2020  . Septic shock ([place]) 04/13/2020  . Pressure injury of skin 04/13/2020    Palliative Care Assessment & Plan  Recommendations/Plan: Agree with DNR/DNI Call placed, goals of care discussions with daughter on the phone, disposition options reviewed, current condition discussed.  SNF rehab with palliative to follow, subsequently, daughter wishes to take patient with her to [place] after rehab trial. Will request TOC assistance.    Code Status:    Code Status Orders  (From admission, onward)         Start     Ordered   04/16/20 1544  Do not attempt resuscitation (DNR)  Continuous       Question Answer Comment  In the event of cardiac or respiratory ARREST Do not call a "code blue"   In the event of cardiac or respiratory ARREST Do not perform Intubation, CPR, defibrillation or ACLS   In the event of cardiac or respiratory ARREST Use medication by any route, position, wound care, and other measures to relive pain and suffering. May use oxygen, suction and manual treatment of airway obstruction as needed for comfort.      04/16/20 1543        Code Status History    Date Active Date Inactive Code Status Order ID Comments User Context   04/13/2020 0213 04/16/2020 1543 Full Code [id number]   Shellia Cleverly, MD ED   Advance Care Planning Activity      Prognosis:  Guarded  Discharge Planning: To Be Determined  Care plan was discussed with patient's daughter   Thank you for allowing the Palliative Medicine Team to assist in the care of this patient.   Time In: 1400 Time Out: 1425 Total Time 25 Prolonged Time Billed No   Greater than 50%  of this time was spent counseling and coordinating care related to the above assessment and plan.  Loistine Chance, MD  Please contact Palliative Medicine Team phone at [phone] for questions and concerns.

## 2020-04-22 NOTE — Progress Notes (Signed)
Resumed care of patient at 1030. Will chart full assessment and verify orders.

## 2020-04-22 NOTE — Progress Notes (Signed)
PROGRESS NOTE    Michelle STONG  IZT:[id number] DOB: Apr 05, 1946 DOA: 04/12/2020 PCP: Venetia Maxon, Sharon Mt, MD  Brief Narrative:  The patient is a 74 year old chronically ill-appearing cachectic Caucasian female with a past medical history significant for hypothyroidism who presented with altered mental status, hypoglycemia, dehydration.  From report neighbors had not seen or heard from her in several days and daughter came in from out of state and found the patient on the floor in feces and was very altered.  EMS was dispatched and upon evaluation her blood sugar was 19 and so they gave her D10.  She was brought to the ED and found to be dehydrated and given 2-1/2 L of IV fluids.  CT head was obtained without acute changes.  Blood and urine cultures were ordered but the ED felt the picture was more consistent with dehydration and hypoglycemia rather than infection so she is not started on antibiotics at that time.  Her mentation largely improved in the ED and she endorsed a recent med change and was placed on a diuretic for her bilateral lower extremity edema.  Further work-up revealed that she ended up going into septic shock and hypovolemic shock from a gram-negative bacteremia likely from being in her feces and possible colitis.  She was started on IV Zosyn and changed to ciprofloxacin but I evaluated and she is on IM ceftriaxone.  She was placed on pressors and had encephalopathy and electrolyte derangements.  They were adjusted and corrected and she was given albumin x2.  She was stabilized in the ICU and had some dysphagia so she was n.p.o. and SLP evaluated and they are now recommending a dysphagia 2 diet with nectar thick liquids.  She is transferred to [facility] service 04/18/2020 and when I evaluated her today she is very frail and cachectic and likely has failure to thrive so we will consult palliative care and dietitian for further evaluation recommendations.  During her hospitalization she had  critical illness thrombocytopenia and was given 2 packs of platelets.  04/19/20: Palliative care was consulted and will see the patient today.  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  Platelet counts continue to drop and will be transfused 2 more units of platelets and I discussed the case with Dr. Alen Blew who feels that this is secondary to her septic shock and her bacteremia and recommend supportive care at this time and transfusions.  We will continue diuresis as tolerated and she will get Lasix twice today.  04/20/20: Today but still confused and wanting to go home.  Blood count has dropped so we will transfuse 1 unit of PRBCs.  WBC is trending upwards.  Potassium is on the lower side.  Renal function is relatively stable compared to yesterday.  6 twice daily again today.  Palliative care to have a goals of care discussion.  Continue on Levaquin.  04/21/2020: She remains significantly volume overloaded still is still positive over 13 L.  We'll continue IV diuresis.  Platelet count dropped again to 18 so we'll type and screen transfuse 1 unit PRBCs.  Electrolytes are abnormal and will be repeated in the setting of her diuresis.  Pulmonary signed off the case has no further recommendations  04/22/2020: Patient remains agitated but she is getting diuresed and her renal function is improved and she is 12 L positive now.  PT OT recommending SNF.  Platelet count is still on the lower side but no active bleeding  so we will continue monitor and repeat in the morning.  After further goals of care discussion the plan is for SNF rehab with palliative to follow and the plan is for the daughter to take the patient back to [place] after rehab.  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 with likely possible concomitant pneumonia -In the ED she is found to be dehydrated and given 2.5 L of IV fluids -Daughter found the patient on the floor in feces and very altered and patient initially had a blood glucose of 19 -Urine culture showing less than 10,000 colony-forming units of insignificant growth -We will change IM ceftriaxone to IV levofloxacin for now; IV Zosyn has now been stopped -WBC is improving and trending down and has gone from 27.9 -> 24.2 ->  17.3 -> 11.2 and has trended down and now trending back up slowly and gone from 9.2 is now 10.6 and today it is 10.8 but will need to monitor very carefully -Procalcitonin level went from 4.81 and trended down to 2.86  -Lactic Acid level went from 6.0 and then trended down to 1.6 -She is now off of pressors -IV fluid was resumed at 75 mL's per hour with D5 and lactated Ringer's when she was in ICU and have now given her hypoalbuminemia and likely volume overload.  -Changed ceftriaxone to IV Levofloxacin for 14 Days -We will obtain PT and OT to further evaluate and treat and have palliative care discuss goals of care given her significant failure to thrive; -PT OT evaluation done and recommending skilled nursing facility post acute hospitalization discharge  Acute Encephalopathy, waxing and waning; remains agitated -Likely multifactorial including her severe hypoglycemia on admission, dehydration, sepsis from her colitis and bacteremia -Initial head and repeat CT done had no acute changes  -She had ICU delirium and was hyperactive and hypoactive but the critical care physician feels that the delirium may be a sign that she may be actively dying -Delirium precautions initiated -Palliative care consulted for further goals of care discussion and patient was somnolent and sleepy the day before yesterday but is again awake and alert today and agitated and  wanting to go home.  Volume overload in the setting of volume resuscitation as well as acute on chronic diastolic CHF -In the setting of her poor nutritional status and possible liver dysfunction -She appeared dry yesterday and so she was initiated on D5 in LR but to me she appears extremely volume overloaded so I stopped her fluid hydration -Now that she has been placed on a diet will stop IV fluid hydration and continue to monitor closely -We will give her IV Lasix and give IV Lasix this morning and again this afternoon  -Echocardiogram showed a normal EF but grade 2 diastolic dysfunction -BNP was elevated at 1,113.0 and is now improving to 437.2 -Strict I's and O's and daily weights -Patient was +20 pound since admission and is now improving with diuresis and is down 4 pounds from the day before yesterday and is now down to 119.  Repeat weight has not been done today. -Now + 11.528 L and will continue diuresis.  He was positive almost 15 L a few days ago -Continue with diuresis as tolerated given her blood  pressure and will get IV Lasix 40 mg twice daily and continue for now -We will reconsult pulmonary and continue diuresis and consider cardiology consultation -Pulmonary had no further recommendations and has signed off  Acute Respiratory Failure with Hypoxia, stable -The setting of above next-patient also has been aspirating likely -Continue Levaquin and diuresis -Chest x-ray done yesterday and showed "Extensive airspace disease with asymmetry favoring infection over edema. There are pleural effusions, greater on the left, and there may be superimposed failure." -Chest x-ray 04/21/2020 showed "Stable bilateral pulmonary edema and pleural effusions." -Continue with IV Levaquin for infection -we will add pulmonary toileting, guaifenesin 1200 mg p.o. twice daily, flutter valve, incentive spirometer, Xopenex and Atrovent breathing treatments -She is on 4 L supplemental oxygen via nasal  cannula -SpO2: 96 % O2 Flow Rate (L/min): 5 L/min-continue weaning as tolerated -Continuous pulse oximetry maintain O2 saturation greater than 90% -Continue supplemental oxygen via nasal cannula wean as tolerated next-we will need ambulatory home O2 screen prior to discharge -Reconsulted pulmonary for further evaluation recommendations -Repeat chest x-ray this AM showed "Note that portions of the left upper lobe obscured by the patient's mandible. Pleural effusions are noted bilaterally, larger on the left than on the right, stable. Suspect atelectasis and potential superimposed airspace consolidation left lower lobe and lingular regions, stable. Atelectatic change right base, stable. Stable cardiac silhouette. Central catheter tip in superior vena cava near cavoatrial junction. No pneumothorax evident. Aortic Atherosclerosis ." -Continue chest PT and flutter valve and incentive spirometry -Appreciate pulmonary recommendations and now they have signed off the case  Dysphagia -Patient has moderately severe oral pharyngeal dysphagia; today SLP evaluated and recommended n.p.o. 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 -Have an Unknown Baseline -BUN/Cr slowly improved from admission but is relatively stable the last few days and is now improving finally; BUN/creatinine has been ranging around 2 and is now improved with diuresis of this 45/1.55 -> 38/1.19 -IV fluids were going when I pick the patient up and will stop them given her extreme volume overload and have  started diuresis -Avoid further nephrotoxic medications, contrast dyes, hypotension and renally dose medications but will give her diuretics given her volume overload and had gotten them twice daily today and will give him IV again twice daily tomorrow -Repeat CMP in the a.m.  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 with no role for anticoagulation at this time given her severe thrombocytopenia -We will try diuresis given her significant volume overload in the setting of heart failure and hypoalbuminemia -We will continue to monitor  Severe Protein Calorie Malnutrition/Failure to Thrive -Now placed on a dysphagia 2 diet -We will consult nutritionist for further evaluation recommendations  Hyperbilirubinemia -Patient's T bili went from 0.5 on admission and has trended up to 1.5 is now trending back down to 0.9 yesterday and today is 1.0 -Liver Doppler done and as below -Continue monitor  and trend and repeat CMP in the a.m.   Hypothyroidism -Patient's TSH was 9.921 and free T4 0.67 -Her levothyroxine has been resumed at 112 mcg   Abnormal LFTs, improving -Still somewhat elevated but trending down from earlier on admission -LFTs have now normalized -Liver Doppler done on 10.18 showed "Widely patent hepatic vasculature with normal directional flow. Nonspecific atrophy of the left lobe of the liver as demonstrated on preceding abdominal CT. No discrete hepatic lesions. Trace amount of perihepatic ascites and small/trace bilateral effusions as was demonstrated on preceding abdominal CT. Cholelithiasis without evidence of cholecystitis." -Continue monitor and trend and repeat CMP in a.m.  Thrombocytopenia -Patient's Platelet Count dropped to 5 and now slowly trending up as it has gone to 30 -> 11 -> 49  and trended back down to 15 and then 10 yesterday.  Will transfuse 2 more packs of platelets and is improved to 70 yesterday but dropped again so we'll transfuse 1 L pack of platelets today given that her platelet count was 18 yesterday and was transfused 1 more unit of packed platelets and is now 29 -In the Setting of Septic Shock and her gram-negative bacteremia and I spoke with oncology who feels that this is secondary to her infection and not related to HUS TTP or ITP -She is transfused 5  packs of platelets total and may consider giving her more in the morning -There is low suspicion for HIT given the timing but Lovenox has been discontinued -Continue to Monitor for S/Sx of Bleeding; Currently no overt bleeding noted -Repeat CBC in the AM  Hypokalemia -Patient's K+ was 3.6 -Check Mag Level but was 2.1 -Continue to Monitor and Replete as Necessary -Repeat CMP  Normocytic anemia/anemia of critical illness -Patient's hemoglobin/mother went from 8.2/20.4 trended down to 7.9/26.0 today 7.0/22.9 and we will transfuse 1 unit PRBCs  -Now posttransfusion her hemoglobin/hematocrit is stable at 8.5/27.1 yesterday and today it is 8.1/26.0 after the 1 unit -we will give Lasix before and after next-continue to monitor for signs and symptoms of bleeding; currently no overt bleeding noted -Repeat CBC in a.m.  Hyponatremia -Mild -Patient sodium is now 146-we will need to continue monitor carefully as she is getting diuresis-repeat -repeat CMP in a.m.  DVT prophylaxis: SCDs given her significant thrombocytopenia prohibiting pharmacological prophylaxis Code Status: DO NOT RESUSCITATE Family Communication: Spoke with the daughter at bedside Disposition Plan: Pending further clinical improvement back to baseline and tolerance of p.o. diet; she will need PT OT to further evaluate and treat  Status is: Inpatient  Remains inpatient appropriate because:Unsafe d/c plan, IV treatments appropriate due to  intensity of illness or inability to take PO and Inpatient level of care appropriate due to severity of illness   Dispo: The patient is from: Home              Anticipated d/c is to: SNF              Anticipated d/c date is: 2- 3 days 1 diuresis back to dry weight              Patient currently is not medically stable to d/c.  Consultants:   PCCM Transfer  Palliative care medicine  Significant Diagnostic Tests:  CT H 10/17> chronic microvascular ischemic changes, mild atrophy related to age.  Micro Data:  10/17 SARS Cov2> neg 10/17 BCx> 1 of 2 staph species, contaminant, 2 of 2 GNRs 10/17 UCx> <10k CFU 10/17: MRSA screen negative Antimicrobials:  10/17 zosyn >10/18  10/18 cipro (GNR resistant to all but cipro, imipenem)>>  Echocardiogram IMPRESSIONS    1. Left ventricular ejection fraction, by estimation, is 55 to 60%. The  left ventricle has normal function. Left ventricular endocardial border  not optimally defined to evaluate regional wall motion. Left ventricular  diastolic parameters are consistent  with Grade II diastolic dysfunction (pseudonormalization). Elevated left  ventricular end-diastolic pressure.  2. Right ventricular systolic function is normal. The right ventricular  size is normal. There is normal pulmonary artery systolic pressure. The  estimated right ventricular systolic pressure is 26.9 mmHg.  3. The mitral valve is grossly normal. No evidence of mitral valve  regurgitation. No evidence of mitral stenosis.  4. The aortic valve is abnormal. There is moderate calcification of the  aortic valve. Aortic valve regurgitation is not visualized. Moderate  aortic valve sclerosis/calcification is present, without any evidence of  aortic stenosis.  5. The inferior vena cava is normal in size with <50% respiratory  variability, suggesting right atrial pressure of 8 mmHg.   FINDINGS  Left Ventricle: Left ventricular ejection fraction, by estimation, is  55  to 60%. The left ventricle has normal function. Left ventricular  endocardial border not optimally defined to evaluate regional wall motion.  The left ventricular internal cavity  size was normal in size. There is no left ventricular hypertrophy. Left  ventricular diastolic parameters are consistent with Grade II diastolic  dysfunction (pseudonormalization). Elevated left ventricular end-diastolic  pressure.   Right Ventricle: The right ventricular size is normal. No increase in  right ventricular wall thickness. Right ventricular systolic function is  normal. There is normal pulmonary artery systolic pressure. The tricuspid  regurgitant velocity is 2.36 m/s, and  with an assumed right atrial pressure of 8 mmHg, the estimated right  ventricular systolic pressure is 48.5 mmHg.   Left Atrium: Left atrial size was normal in size.   Right Atrium: Right atrial size was normal in size.   Pericardium: There is no evidence of pericardial effusion.   Mitral Valve: Mild chordal SAM. The mitral valve is grossly normal. Mild  mitral annular calcification. No evidence of mitral valve regurgitation.  No evidence of mitral valve stenosis.   Tricuspid Valve: The tricuspid valve is normal in structure. Tricuspid  valve regurgitation is mild . No evidence of tricuspid stenosis.   Aortic Valve: The aortic valve is abnormal. There is moderate  calcification of the aortic valve. Aortic valve regurgitation is not  visualized. Mild to moderate aortic valve sclerosis/calcification is  present, without any evidence of aortic stenosis.   Pulmonic Valve: The pulmonic valve was grossly normal. Pulmonic valve  regurgitation is trivial. No evidence of pulmonic stenosis.   Aorta: The aortic root is normal in size and structure.   Venous: The inferior vena cava is normal in size with less than 50%  respiratory variability, suggesting right atrial pressure of 8 mmHg.   IAS/Shunts: No atrial level shunt  detected by color flow Doppler.   Additional Comments: A venous catheter is visualized in the right atrium.     LEFT VENTRICLE  PLAX 2D  LVIDd:     4.20 cm Diastology  LVIDs:     2.50 cm LV e' medial:  3.68 cm/s  LV PW:     1.00 cm LV E/e' medial: 19.0  LV IVS:    0.80 cm LV e' lateral:  6.13 cm/s  LVOT diam:   2.00 cm LV E/e' lateral: 11.4  LV SV:     54  LV SV Index:  35  LVOT Area:   3.14 cm     RIGHT VENTRICLE  RV S prime:   11.20 cm/s  TAPSE (M-mode): 1.5 cm   LEFT ATRIUM       Index    RIGHT ATRIUM      Index  LA diam:    2.90 cm 1.88 cm/m RA Area:   15.00 cm  LA Vol (A2C):  50.7 ml 32.79 ml/m RA Volume:  34.20 ml 22.12 ml/m  LA Vol (A4C):  25.2 ml 16.30 ml/m  LA Biplane Vol: 39.6 ml 25.61 ml/m  AORTIC VALVE  LVOT Vmax:  90.50 cm/s  LVOT Vmean: 66.250 cm/s  LVOT VTI:  0.172 m    AORTA  Ao Root diam: 3.00 cm  Ao Asc diam: 2.70 cm   MITRAL VALVE        TRICUSPID VALVE  MV Area (PHT): 3.12 cm  TR Peak grad:  22.3 mmHg  MV Decel Time: 243 msec  TR Vmax:    236.00 cm/s  MV E velocity: 69.80 cm/s  MV A velocity: 69.00 cm/s SHUNTS  MV E/A ratio: 1.01    Systemic VTI: 0.17 m               Systemic Diam: 2.00 cm   LE VENOUS DUPLEX Summary:  RIGHT:  - Findings consistent with chronic deep vein thrombosis involving the  visualized segments of the right peroneal veins.  - A cystic structure is found in the popliteal fossa.    LEFT:  - There is no evidence of deep vein thrombosis in the lower extremity.  However, portions of this examination were limited- see technologist  comments above.    *See table(s) above for measurements and observations.    Antimicrobials:  Anti-infectives (From admission, onward)   Start     Dose/Rate Route Frequency Ordered Stop   04/18/20 1600  levofloxacin (LEVAQUIN) IVPB 750 mg        750 mg 100 mL/hr over 90 Minutes  Intravenous Every 48 hours 04/18/20 0951     04/17/20 2230  cefTRIAXone (ROCEPHIN) injection 1 g  Status:  Discontinued        1 g Intramuscular Every 24 hours 04/17/20 2144 04/18/20 0949   04/17/20 1400  levofloxacin (LEVAQUIN) IVPB 500 mg  Status:  Discontinued        500 mg 100 mL/hr over 60 Minutes Intravenous Every 24 hours 04/17/20 1157 04/17/20 2144   04/14/20 1600  ciprofloxacin (CIPRO) IVPB 400 mg  Status:  Discontinued        400 mg 200 mL/hr over 60 Minutes Intravenous Every 24 hours 04/14/20 1443 04/17/20 1157   04/13/20 2000  vancomycin (VANCOCIN) IVPB 1000 mg/200 mL premix        1,000 mg 200 mL/hr over 60 Minutes Intravenous  Once 04/13/20 1844 04/13/20 2111   04/13/20 1850  vancomycin variable dose per unstable renal function (pharmacist dosing)  Status:  Discontinued         Does not apply See admin instructions 04/13/20 1851 04/14/20 1003   04/13/20 1200  piperacillin-tazobactam (ZOSYN) IVPB 2.25 g  Status:  Discontinued        2.25 g 100 mL/hr over 30 Minutes Intravenous Every 8 hours 04/13/20 0639 04/14/20 1442   04/13/20 0230  piperacillin-tazobactam (ZOSYN) IVPB 3.375 g       Note to Pharmacy: Pharmacy to dose   3.375 g 100 mL/hr over 30 Minutes Intravenous  Once 04/13/20 0216 04/13/20 0342  Subjective: Seen and examined at bedside and again was extremely agitated and upset and was refusing part of her care.  Palliative care involved for goals of care discussion and the plan is for her to get a SNF and then have the patient go to [place] at present.  She denies any complaints today and states she is like "I am here".  Did not really want to eat interacting with very agitated.  No other concerns or plans at this time.  Objective: Vitals:   04/21/20 2117 04/22/20 0426 04/22/20 0500 04/22/20 1258  BP: (!) 153/83 (!) 155/80  (!) 143/69  Pulse: 92 88  96  Resp: 15 15  14   Temp: (!) 97.5 F (36.4 C) 97.9 F (36.6 C)  97.6 F (36.4 C)  TempSrc: Oral      SpO2: 96% 100%  96%  Weight:   51.2 kg   Height:        Intake/Output Summary (Last 24 hours) at 04/22/2020 1840 Last data filed at 04/22/2020 1646 Gross per 24 hour  Intake 130 ml  Output 1700 ml  Net -1570 ml   Filed Weights   04/19/20 0500 04/20/20 0500 04/22/20 0500  Weight: 56.2 kg 54 kg 51.2 kg   Examination: Physical Exam:  Constitutional: The patient is a very thin and frail cachectic chronically ill-appearing Caucasian female currently in no acute distress but she is extremely agitated again this morning Eyes: Lids and conjunctivae normal, sclerae anicteric  ENMT: External Ears, Nose appear normal. Grossly normal hearing.  Neck: Appears normal, supple, no cervical masses, normal ROM, no appreciable thyromegaly; slight JVD Respiratory: Diminished to auscultation bilaterally with coarse breath sounds no crackles and rhonchi bilaterally.  No appreciable wheezing but she is wearing supplemental oxygen via nasal cannula 5 L,  Cardiovascular: RRR, no murmurs / rubs / gallops. S1 and S2 auscultated.  Has some 1+ lower extremity edema and lower legs are not wrapped in weeping a little Abdomen: Soft, non-tender, non-distended. Bowel sounds positive.  GU: Deferred. Musculoskeletal: No clubbing / cyanosis of digits/nails. No joint deformity upper and lower extremities.  PICC line is in place Skin: No rashes, lesions, ulcers on limited skin evaluation but does have some petechiae and some ecchymosis diffusely noted in her right arm is extremely erythematous and red from bruising. No induration; Warm and dry.  Neurologic: CN 2-12 grossly intact with no focal deficits.  Romberg sign cerebellar reflexes not assessed.  Psychiatric: Slightly impaired judgment and insight.  She is alert and awake.  Extremely agitated mood and appropriate affect.   Data Reviewed: I have personally reviewed following labs and imaging studies  CBC: Recent Labs  Lab 04/19/20 0430 04/19/20 1330  04/20/20 0446 04/21/20 0850 04/22/20 0519  WBC 7.9 9.2 10.6* 10.6* 10.8*  NEUTROABS 6.4 7.5 9.0* 9.3* 9.6*  HGB 8.2* 7.9* 7.0* 8.5* 8.1*  HCT 27.4* 26.0* 22.9* 27.1* 26.0*  MCV 86.2 85.0 82.4 85.2 86.7  PLT 15* 10* 70* 18* 29*   Basic Metabolic Panel: Recent Labs  Lab 04/18/20 0439 04/19/20 0430 04/20/20 0446 04/21/20 0850 04/22/20 0519  NA 139 142 140 144 146*  K 3.4* 4.3 3.4* 3.0* 3.6  CL 101 102 101 104 104  CO2 27 27 27 27  33*  GLUCOSE 119* 100* 100* 109* 101*  BUN 46* 40* 51* 45* 38*  CREATININE 2.01* 1.83* 1.90* 1.55* 1.19*  CALCIUM 7.2* 7.5* 7.3* 7.0* 7.3*  MG 2.2 2.2 1.9 1.8 2.1  PHOS  --  6.3* 5.2* 3.7  3.9   GFR: Estimated Creatinine Clearance: 33 mL/min (A) (by C-G formula based on SCr of 1.19 mg/dL (H)). Liver Function Tests: Recent Labs  Lab 04/18/20 0439 04/19/20 0430 04/20/20 0446 04/21/20 0850 04/22/20 0519  AST 67* 52* 41 38 38  ALT 65* 65* 52* 46* 43  ALKPHOS 283* 270* 380* 398* 355*  BILITOT 1.5* 1.0 1.2 0.9 1.0  PROT 4.5* 4.8* 5.0* 4.9* 5.0*  ALBUMIN 1.9* 1.9* 2.0* 1.9* 1.9*   No results for input(s): LIPASE, AMYLASE in the last 168 hours. No results for input(s): AMMONIA in the last 168 hours. Coagulation Profile: No results for input(s): INR, PROTIME in the last 168 hours. Cardiac Enzymes: No results for input(s): CKTOTAL, CKMB, CKMBINDEX, TROPONINI in the last 168 hours. BNP (last 3 results) No results for input(s): PROBNP in the last 8760 hours. HbA1C: No results for input(s): HGBA1C in the last 72 hours. CBG: Recent Labs  Lab 04/21/20 2358 04/22/20 0418 04/22/20 0750 04/22/20 1132 04/22/20 1622  GLUCAP 157* 96 80 99 119*   Lipid Profile: No results for input(s): CHOL, HDL, LDLCALC, TRIG, CHOLHDL, LDLDIRECT in the last 72 hours. Thyroid Function Tests: No results for input(s): TSH, T4TOTAL, FREET4, T3FREE, THYROIDAB in the last 72 hours. Anemia Panel: No results for input(s): VITAMINB12, FOLATE, FERRITIN, TIBC, IRON,  RETICCTPCT in the last 72 hours. Sepsis Labs: No results for input(s): PROCALCITON, LATICACIDVEN in the last 168 hours.  Recent Results (from the past 240 hour(s))  Culture, blood (routine x 2)     Status: Abnormal   Collection Time: 04/12/20 11:45 PM   Specimen: BLOOD RIGHT ARM  Result Value Ref Range Status   Specimen Description BLOOD RIGHT ARM  Final   Special Requests   Final    BOTTLES DRAWN AEROBIC AND ANAEROBIC Blood Culture adequate volume   Culture  Setup Time   Final    GRAM NEGATIVE RODS GRAM POSITIVE COCCI IN CLUSTERS IN BOTH AEROBIC AND ANAEROBIC BOTTLES CRITICAL RESULT CALLED TO, READ BACK BY AND VERIFIED WITH: PHARMD E WILLIAMSON [id number] [place] 1757 BY CM Performed at [facility], [place] [address] [place], [place] [address]    Culture (A)  Final    FLAVOBACTERIUM ODORATUM AEROCOCCUS VIRIDANS STAPHYLOCOCCUS XYLOSUS    Report Status 04/15/2020 FINAL  Final  Blood Culture ID Panel (Reflexed)     Status: Abnormal   Collection Time: 04/12/20 11:45 PM  Result Value Ref Range Status   Enterococcus faecalis NOT DETECTED NOT DETECTED Final   Enterococcus Faecium NOT DETECTED NOT DETECTED Final   Listeria monocytogenes NOT DETECTED NOT DETECTED Final   Staphylococcus species DETECTED (A) NOT DETECTED Final    Comment: CRITICAL RESULT CALLED TO, READ BACK BY AND VERIFIED WITH: PHARMD E WILLIAMSON [id number] AT 1757 BY CM    Staphylococcus aureus (BCID) NOT DETECTED NOT DETECTED Final   Staphylococcus epidermidis NOT DETECTED NOT DETECTED Final   Staphylococcus lugdunensis NOT DETECTED NOT DETECTED Final   Streptococcus species NOT DETECTED NOT DETECTED Final   Streptococcus agalactiae NOT DETECTED NOT DETECTED Final   Streptococcus pneumoniae NOT DETECTED NOT DETECTED Final   Streptococcus pyogenes NOT DETECTED NOT DETECTED Final   A.calcoaceticus-baumannii NOT DETECTED NOT DETECTED Final   Bacteroides fragilis NOT DETECTED NOT DETECTED Final   Enterobacterales NOT  DETECTED NOT DETECTED Final   Enterobacter cloacae complex NOT DETECTED NOT DETECTED Final   Escherichia coli NOT DETECTED NOT DETECTED Final   Klebsiella aerogenes NOT DETECTED NOT DETECTED Final   Klebsiella oxytoca NOT  DETECTED NOT DETECTED Final   Klebsiella pneumoniae NOT DETECTED NOT DETECTED Final   Proteus species NOT DETECTED NOT DETECTED Final   Salmonella species NOT DETECTED NOT DETECTED Final   Serratia marcescens NOT DETECTED NOT DETECTED Final   Haemophilus influenzae NOT DETECTED NOT DETECTED Final   Neisseria meningitidis NOT DETECTED NOT DETECTED Final   Pseudomonas aeruginosa NOT DETECTED NOT DETECTED Final   Stenotrophomonas maltophilia NOT DETECTED NOT DETECTED Final   Candida albicans NOT DETECTED NOT DETECTED Final   Candida auris NOT DETECTED NOT DETECTED Final   Candida glabrata NOT DETECTED NOT DETECTED Final   Candida krusei NOT DETECTED NOT DETECTED Final   Candida parapsilosis NOT DETECTED NOT DETECTED Final   Candida tropicalis NOT DETECTED NOT DETECTED Final   Cryptococcus neoformans/gattii NOT DETECTED NOT DETECTED Final    Comment: Performed at [facility], [place] [address] [place], [place] [address]  Respiratory Panel by RT PCR (Flu A&B, Covid) - Nasopharyngeal Swab     Status: None   Collection Time: 04/13/20 12:13 AM   Specimen: Nasopharyngeal Swab  Result Value Ref Range Status   SARS Coronavirus 2 by RT PCR NEGATIVE NEGATIVE Final    Comment: (NOTE) SARS-CoV-2 target nucleic acids are NOT DETECTED.  The SARS-CoV-2 RNA is generally detectable in upper respiratoy specimens during the acute phase of infection. The lowest concentration of SARS-CoV-2 viral copies this assay can detect is 131 copies/mL. A negative result does not preclude SARS-Cov-2 infection and should not be used as the sole basis for treatment or other patient management decisions. A negative result may occur with  improper specimen collection/handling, submission of  specimen other than nasopharyngeal swab, presence of viral mutation(s) within the areas targeted by this assay, and inadequate number of viral copies (<131 copies/mL). A negative result must be combined with clinical observations, patient history, and epidemiological information. The expected result is Negative.  Fact Sheet for Patients:  PinkCheek.be  Fact Sheet for Healthcare Providers:  GravelBags.it  This test is no t yet approved or cleared by the [place] FDA and  has been authorized for detection and/or diagnosis of SARS-CoV-2 by FDA under an Emergency Use Authorization (EUA). This EUA will remain  in effect (meaning this test can be used) for the duration of the COVID-19 declaration under Section 564(b)(1) of the Act, 21 U.S.C. section 360bbb-3(b)(1), unless the authorization is terminated or revoked sooner.     Influenza A by PCR NEGATIVE NEGATIVE Final   Influenza B by PCR NEGATIVE NEGATIVE Final    Comment: (NOTE) The Xpert Xpress SARS-CoV-2/FLU/RSV assay is intended as an aid in  the diagnosis of influenza from Nasopharyngeal swab specimens and  should not be used as a sole basis for treatment. Nasal washings and  aspirates are unacceptable for Xpert Xpress SARS-CoV-2/FLU/RSV  testing.  Fact Sheet for Patients: PinkCheek.be  Fact Sheet for Healthcare Providers: GravelBags.it  This test is not yet approved or cleared by the [place] FDA and  has been authorized for detection and/or diagnosis of SARS-CoV-2 by  FDA under an Emergency Use Authorization (EUA). This EUA will remain  in effect (meaning this test can be used) for the duration of the  Covid-19 declaration under Section 564(b)(1) of the Act, 21  U.S.C. section 360bbb-3(b)(1), unless the authorization is  terminated or revoked. Performed at [facility], [place] [address],  [place], [place] [address]   Culture, blood (routine x 2)     Status: Abnormal   Collection  Time: 04/13/20  1:18 AM   Specimen: BLOOD RIGHT HAND  Result Value Ref Range Status   Specimen Description BLOOD RIGHT HAND  Final   Special Requests   Final    BOTTLES DRAWN AEROBIC ONLY Blood Culture adequate volume   Culture  Setup Time   Final    GRAM NEGATIVE RODS AEROBIC BOTTLE ONLY CRITICAL RESULT CALLED TO, READ BACK BY AND VERIFIED WITH: PHARMD S CHRISTY [id number] AT 1820 BY CM Performed at [facility], [place] [address] [place], [place] [address]    Culture FLAVOBACTERIUM ODORATUM (A)  Final   Report Status 04/15/2020 FINAL  Final   Organism ID, Bacteria FLAVOBACTERIUM ODORATUM  Final      Susceptibility   Flavobacterium odoratum - MIC*    CEFAZOLIN >=64 RESISTANT Resistant     CEFTAZIDIME >=64 RESISTANT Resistant     CIPROFLOXACIN 1 SENSITIVE Sensitive     GENTAMICIN >=16 RESISTANT Resistant     IMIPENEM 4 SENSITIVE Sensitive     TRIMETH/SULFA >=320 RESISTANT Resistant     PIP/TAZO 32 INTERMEDIATE Intermediate     * FLAVOBACTERIUM ODORATUM  Urine culture     Status: Abnormal   Collection Time: 04/13/20  6:18 AM   Specimen: Urine, Catheterized  Result Value Ref Range Status   Specimen Description URINE, CATHETERIZED  Final   Special Requests NONE  Final   Culture (A)  Final    <10,000 COLONIES/mL INSIGNIFICANT GROWTH Performed at [facility], [place] [address] [place], [place] [address]    Report Status 04/14/2020 FINAL  Final  MRSA PCR Screening     Status: None   Collection Time: 04/13/20 10:33 AM   Specimen: Nasopharyngeal  Result Value Ref Range Status   MRSA by PCR NEGATIVE NEGATIVE Final    Comment:        The GeneXpert MRSA Assay (FDA approved for NASAL specimens only), is one component of a comprehensive MRSA colonization surveillance program. It is not intended to diagnose MRSA infection nor to guide or monitor treatment for MRSA infections. Performed  at [facility], [place] [address] [place], [place] [address]   Gastrointestinal Panel by PCR , Stool     Status: None   Collection Time: 04/13/20  2:36 PM   Specimen: Stool  Result Value Ref Range Status   Campylobacter species NOT DETECTED NOT DETECTED Final   Plesimonas shigelloides NOT DETECTED NOT DETECTED Final   Salmonella species NOT DETECTED NOT DETECTED Final   Yersinia enterocolitica NOT DETECTED NOT DETECTED Final   Vibrio species NOT DETECTED NOT DETECTED Final   Vibrio cholerae NOT DETECTED NOT DETECTED Final   Enteroaggregative E coli (EAEC) NOT DETECTED NOT DETECTED Final   Enteropathogenic E coli (EPEC) NOT DETECTED NOT DETECTED Final   Enterotoxigenic E coli (ETEC) NOT DETECTED NOT DETECTED Final   Shiga like toxin producing E coli (STEC) NOT DETECTED NOT DETECTED Final   Shigella/Enteroinvasive E coli (EIEC) NOT DETECTED NOT DETECTED Final   Cryptosporidium NOT DETECTED NOT DETECTED Final   Cyclospora cayetanensis NOT DETECTED NOT DETECTED Final   Entamoeba histolytica NOT DETECTED NOT DETECTED Final   Giardia lamblia NOT DETECTED NOT DETECTED Final   Adenovirus F40/41 NOT DETECTED NOT DETECTED Final   Astrovirus NOT DETECTED NOT DETECTED Final   Norovirus GI/GII NOT DETECTED NOT DETECTED Final   Rotavirus A NOT DETECTED NOT DETECTED Final   Sapovirus (I, II, IV, and V) NOT DETECTED NOT DETECTED Final    Comment: Performed  at [facility], [place], [place] [address]  C Difficile Quick Screen w PCR reflex     Status: None   Collection Time: 04/14/20 11:40 AM   Specimen: Stool  Result Value Ref Range Status   C Diff antigen NEGATIVE NEGATIVE Final   C Diff toxin NEGATIVE NEGATIVE Final   C Diff interpretation No C. difficile detected.  Final    Comment: Performed at [facility], [place] [address], [place], [place] [address]     RN Pressure Injury Documentation: Pressure Injury 04/13/20 Heel Right Deep  Tissue Pressure Injury - Purple or maroon localized area of discolored intact skin or blood-filled blister due to damage of underlying soft tissue from pressure and/or shear. spanning entire sole of foot (Active)  04/13/20 1030  Location: Heel  Location Orientation: Right  Staging: Deep Tissue Pressure Injury - Purple or maroon localized area of discolored intact skin or blood-filled blister due to damage of underlying soft tissue from pressure and/or shear.  Wound Description (Comments): spanning entire sole of foot  Present on Admission: Yes     Pressure Injury 04/13/20 Heel Left Deep Tissue Pressure Injury - Purple or maroon localized area of discolored intact skin or blood-filled blister due to damage of underlying soft tissue from pressure and/or shear. (Active)  04/13/20 1030  Location: Heel  Location Orientation: Left  Staging: Deep Tissue Pressure Injury - Purple or maroon localized area of discolored intact skin or blood-filled blister due to damage of underlying soft tissue from pressure and/or shear.  Wound Description (Comments):   Present on Admission: Yes     Pressure Injury 04/13/20 Sacrum Stage 3 -  Full thickness tissue loss. Subcutaneous fat may be visible but bone, tendon or muscle are NOT exposed. deep tissue injury surrounding stg 3 (Active)  04/13/20 1030  Location: Sacrum  Location Orientation:   Staging: Stage 3 -  Full thickness tissue loss. Subcutaneous fat may be visible but bone, tendon or muscle are NOT exposed.  Wound Description (Comments): deep tissue injury surrounding stg 3  Present on Admission: Yes     Pressure Injury 04/13/20 Knee Anterior;Left Deep Tissue Pressure Injury - Purple or maroon localized area of discolored intact skin or blood-filled blister due to damage of underlying soft tissue from pressure and/or shear. (Active)  04/13/20 1030  Location: Knee  Location Orientation: Anterior;Left  Staging: Deep Tissue Pressure Injury - Purple or maroon  localized area of discolored intact skin or blood-filled blister due to damage of underlying soft tissue from pressure and/or shear.  Wound Description (Comments):   Present on Admission: Yes    Estimated body mass index is 18.79 kg/m as calculated from the following:   Height as of this encounter: 5\' 5"  (1.651 m).   Weight as of this encounter: 51.2 kg.  Malnutrition Type:  Nutrition Problem: Inadequate oral intake Etiology: lethargy/confusion Malnutrition Characteristics:  Signs/Symptoms: meal completion < 25%  Nutrition Interventions:  Interventions: Ensure Enlive (each supplement provides 350kcal and 20 grams of protein), MVI, Magic cup Radiology Studies: DG CHEST PORT 1 VIEW  Result Date: 04/22/2020 CLINICAL DATA:  Shortness of breath EXAM: PORTABLE CHEST 1 VIEW COMPARISON:  April 21, 2020 FINDINGS: The patient's mandible obscures portions of the left upper lobe. There is a persistent fairly sizable left pleural effusion with a small right pleural effusion. There is atelectasis with suspected consolidation in portions of the left lower lobe and lingula. Atelectatic changes noted in the right lower lung region. Central  catheter tip is in the superior vena cava near the cavoatrial junction. Heart is upper normal in size with pulmonary vascularity normal. There is aortic atherosclerosis. No bone lesions. IMPRESSION: Note that portions of the left upper lobe obscured by the patient's mandible. Pleural effusions are noted bilaterally, larger on the left than on the right, stable. Suspect atelectasis and potential superimposed airspace consolidation left lower lobe and lingular regions, stable. Atelectatic change right base, stable. Stable cardiac silhouette. Central catheter tip in superior vena cava near cavoatrial junction. No pneumothorax evident. Aortic Atherosclerosis (ICD10-I70.0). Electronically Signed   By: Lowella Grip III M.D.   On: 04/22/2020 07:59   DG CHEST PORT 1  VIEW  Result Date: 04/21/2020 CLINICAL DATA:  Shortness of breath. EXAM: PORTABLE CHEST 1 VIEW COMPARISON:  April 20, 2020. FINDINGS: Stable cardiomediastinal silhouette. Stable bilateral interstitial densities are noted concerning for pulmonary edema. Stable bilateral pleural effusions are noted, left greater than right. No pneumothorax is noted. Right-sided PICC line is unchanged in position. Bony thorax is unremarkable. IMPRESSION: Stable bilateral pulmonary edema and pleural effusions. Electronically Signed   By: Marijo Conception M.D.   On: 04/21/2020 09:07   Scheduled Meds: . sodium chloride   Intravenous Once  . Chlorhexidine Gluconate Cloth  6 each Topical Daily  . collagenase   Topical Daily  . feeding supplement  237 mL Oral BID BM  . furosemide  40 mg Intravenous BID  . guaiFENesin  1,200 mg Oral BID  . ipratropium  0.5 mg Nebulization TID  . levalbuterol  0.63 mg Nebulization TID  . levothyroxine  112 mcg Oral Q0600  . mouth rinse  15 mL Mouth Rinse BID  . potassium chloride  40 mEq Oral BID  . sodium chloride flush  10-40 mL Intracatheter Q12H   Continuous Infusions: . sodium chloride 250 mL (04/13/20 0414)  . levofloxacin (LEVAQUIN) IV 750 mg (04/22/20 1645)    LOS: 9 days   Kerney Elbe, DO [facility] PAGER is on [place]  If 7PM-7AM, please contact night-coverage [email or web address]

## 2020-04-23 ENCOUNTER — Inpatient Hospital Stay (HOSPITAL_COMMUNITY): Payer: Medicare Other

## 2020-04-23 DIAGNOSIS — R6521 Severe sepsis with septic shock: Secondary | ICD-10-CM | POA: Diagnosis not present

## 2020-04-23 DIAGNOSIS — A419 Sepsis, unspecified organism: Secondary | ICD-10-CM | POA: Diagnosis not present

## 2020-04-23 LAB — GLUCOSE, CAPILLARY
Glucose-Capillary: 102 mg/dL — ABNORMAL HIGH (ref 70–99)
Glucose-Capillary: 165 mg/dL — ABNORMAL HIGH (ref 70–99)
Glucose-Capillary: 184 mg/dL — ABNORMAL HIGH (ref 70–99)
Glucose-Capillary: 81 mg/dL (ref 70–99)
Glucose-Capillary: 84 mg/dL (ref 70–99)
Glucose-Capillary: 86 mg/dL (ref 70–99)
Glucose-Capillary: 94 mg/dL (ref 70–99)

## 2020-04-23 LAB — COMPREHENSIVE METABOLIC PANEL
ALT: 38 U/L (ref 0–44)
AST: 36 U/L (ref 15–41)
Albumin: 1.9 g/dL — ABNORMAL LOW (ref 3.5–5.0)
Alkaline Phosphatase: 333 U/L — ABNORMAL HIGH (ref 38–126)
Anion gap: 8 (ref 5–15)
BUN: 30 mg/dL — ABNORMAL HIGH (ref 8–23)
CO2: 34 mmol/L — ABNORMAL HIGH (ref 22–32)
Calcium: 7.2 mg/dL — ABNORMAL LOW (ref 8.9–10.3)
Chloride: 105 mmol/L (ref 98–111)
Creatinine, Ser: 0.98 mg/dL (ref 0.44–1.00)
GFR, Estimated: >60 mL/min (ref >60)
Glucose, Bld: 87 mg/dL (ref 70–99)
Potassium: 3.3 mmol/L — ABNORMAL LOW (ref 3.5–5.1)
Sodium: 147 mmol/L — ABNORMAL HIGH (ref 135–145)
Total Bilirubin: 0.9 mg/dL (ref 0.3–1.2)
Total Protein: 4.8 g/dL — ABNORMAL LOW (ref 6.5–8.1)

## 2020-04-23 LAB — CBC WITH DIFFERENTIAL/PLATELET
Abs Immature Granulocytes: 0.1 10*3/uL — ABNORMAL HIGH (ref 0.00–0.07)
Basophils Absolute: 0 10*3/uL (ref 0.0–0.1)
Basophils Relative: 0 %
Eosinophils Absolute: 0 10*3/uL (ref 0.0–0.5)
Eosinophils Relative: 0 %
HCT: 24.7 % — ABNORMAL LOW (ref 36.0–46.0)
Hemoglobin: 7.6 g/dL — ABNORMAL LOW (ref 12.0–15.0)
Immature Granulocytes: 1 %
Lymphocytes Relative: 6 %
Lymphs Abs: 0.7 10*3/uL (ref 0.7–4.0)
MCH: 27.1 pg (ref 26.0–34.0)
MCHC: 30.8 g/dL (ref 30.0–36.0)
MCV: 88.2 fL (ref 80.0–100.0)
Monocytes Absolute: 0.5 10*3/uL (ref 0.1–1.0)
Monocytes Relative: 4 %
Neutro Abs: 10.6 10*3/uL — ABNORMAL HIGH (ref 1.7–7.7)
Neutrophils Relative %: 89 %
Platelets: 19 10*3/uL — CL (ref 150–400)
RBC: 2.8 MIL/uL — ABNORMAL LOW (ref 3.87–5.11)
RDW: 24.1 % — ABNORMAL HIGH (ref 11.5–15.5)
WBC: 12 10*3/uL — ABNORMAL HIGH (ref 4.0–10.5)
nRBC: 0 % (ref 0.0–0.2)

## 2020-04-23 LAB — MAGNESIUM: Magnesium: 1.8 mg/dL (ref 1.7–2.4)

## 2020-04-23 LAB — PHOSPHORUS: Phosphorus: 3.3 mg/dL (ref 2.5–4.6)

## 2020-04-23 MED ORDER — IPRATROPIUM BROMIDE 0.02 % IN SOLN: 0.5000 mg | Freq: Four times a day (QID) | RESPIRATORY_TRACT | Status: DC | PRN

## 2020-04-23 MED ORDER — POTASSIUM CHLORIDE 20 MEQ/15ML (10%) PO SOLN
20.0000 meq | Freq: Once | ORAL | Status: AC
  Administered 2020-04-23: 20 meq via ORAL
  Filled 2020-04-23: qty 15

## 2020-04-23 MED ORDER — LEVALBUTEROL HCL 0.63 MG/3ML IN NEBU: 0.6300 mg | INHALATION_SOLUTION | Freq: Four times a day (QID) | RESPIRATORY_TRACT | Status: DC | PRN

## 2020-04-23 NOTE — Progress Notes (Signed)
  Speech Language Pathology Treatment: Dysphagia  Patient Details Name: Michelle Avila MRN: [id number] DOB: 26-Sep-1945 Today's Date: 04/23/2020 Time: 0938-1829 SLP Time Calculation (min) (ACUTE ONLY): 25 min  Assessment / Plan / Recommendation Clinical Impression  Pt seen at bedside to assess tolerance of dys 2/nectar thick liquid diet. Pt is awake and alert, and is most pleasant and cooperative today. She was resting in bed eating magic cup upon arrival of SLP. Pt seems to have improved significantly since MBS completed last week (04/17/20). Pt speech is no longer dysarthric, and she demonstrates improved strength and endurance. Pt accepted trials of thin liquid (water) via cup and straw, with encouragement to take small individual sips. No overt s/s aspiration observed despite multiple presentations. Will continue Dys2 solids for energy conservation, and will advance liquids to thin. Safe swallow precautions updated at [facility]. SLP will continue to follow to assess tolerance of advanced consistency liquid and readiness to advance solids.    HPI HPI: Pt presented with AMS, was found down covered in feces at home by her neighbors.  PMH for smoking, Pneumonitis due to inhalation of food and vomit in 2016, Pressure ulcer of sacral region, stage 1, Acute respiratory failure with hypoxia, protein-calorie malnutrition, bacterial pneumonia, was living alone, Diarrhea, Fluid overload, Altered mental status, Klebsiella pneumoniae (k. pneumoniae), Pure hypercholesterolemia, Essential (primary) hypertension, Hypotension, Tobacco use, bilirubin metabolism, Escherichia coli (E. coli), , Hypomagnesemia, Hypokalemia, Anemia, unspecified, Body mass index (BMI) 19.9 or less, adult malaise.      SLP Plan  Continue with current plan of care;Goals updated       Recommendations  Diet recommendations: Dysphagia 2 (fine chop);Thin liquid Liquids provided via: Cup;Straw Medication Administration: Crushed with  puree Supervision: Patient able to self feed;Staff to assist with self feeding;Full supervision/cueing for compensatory strategies Compensations: Slow rate;Small sips/bites;Use straw to facilitate chin tuck;Chin tuck Postural Changes and/or Swallow Maneuvers: Seated upright 90 degrees;Upright 30-60 min after meal                Oral Care Recommendations: Oral care BID Follow up Recommendations: Skilled Nursing facility;24 hour supervision/assistance SLP Visit Diagnosis: Dysphagia, pharyngoesophageal phase (R13.14);Dysphagia, oropharyngeal phase (R13.12) Plan: Continue with current plan of care;Goals updated       Malo B. Quentin Ore, [facility], [place] Speech Language Pathologist Office: [phone] Pager: [phone]   [name] 04/23/2020, 3:33 PM

## 2020-04-23 NOTE — Hospital Course (Addendum)
The patient is a 74 year old chronically ill-appearing cachectic Caucasian female with a past medical history significant for hypothyroidism who presented with altered mental status, hypoglycemia, dehydration.  From report neighbors had not seen or heard from her in several days and daughter came in from out of state and found the patient on the floor in feces and was very altered.  EMS was dispatched and upon evaluation her blood sugar was 19 and so they gave her D10.  She was brought to the ED and found to be dehydrated and given 2-1/2 L of IV fluids.  CT head was obtained without acute changes.  Blood and urine cultures were ordered but the ED felt the picture was more consistent with dehydration and hypoglycemia rather than infection so she was not started on antibiotics at that time.  Her mentation largely improved in the ED and she endorsed a recent med change and was placed on a diuretic for her bilateral lower extremity edema.    Further work-up revealed that she ended up going into septic shock and hypovolemic shock from a gram-negative bacteremia likely from being in her feces and possible colitis.  She was started on IV Zosyn briefly then changed to Levaquin and then to ciprofloxacin.  She was placed on pressors and had encephalopathy and electrolyte derangements.  They were adjusted and corrected and she was given albumin and stabilized in the ICU.  She had some dysphagia so she was n.p.o. and SLP evaluated and they are now recommending a dysphagia 2 diet with nectar thick liquids.   She is transferred to [facility] service 04/18/2020.  During her hospitalization she had critical illness thrombocytopenia and anemia. She gets transfusions as needed while bone marrow recovers.    04/19/20: Palliative care was consulted and will see the patient today.  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  Platelet counts continue to drop and will be transfused 2  more units of platelets and I discussed the case with Dr. Alen Blew who feels that this is secondary to her septic shock and her bacteremia and recommend supportive care at this time and transfusions.  Continue diuresis as tolerated with lasix.   04/20/20: Blood count has dropped so we will transfuse 1 unit of PRBCs.  WBC is trending upwards.  Potassium is on the lower side.  Renal function is relatively stable.  Palliative care to have a goals of care discussion.  Continued on Levaquin.   04/21/2020: She remains significantly volume overloaded still is still positive over 13L.  Continue IV diuresis.  Platelet count dropped again to 18  so getting 1 unit. Electrolytes are abnormal and will be repeated in the setting of her diuresis.  Pulmonary signed off the case has no further recommendations   04/22/2020: Patient remains agitated but she is getting diuresed and her renal function is improved and she is 12 L positive now.  PT OT recommending SNF.  Platelet count is still on the lower side but no active bleeding so we will continue monitor and repeat in the morning.    Due to her ongoing right foot wound/mummification, a consult with ortho and vascular surgery was pursued.  She will likely need a right AKA and intense wound care with her LLE.  She will be transferred to [facility] for further vascular surgery evaluation and ongoing wound care to LLE. Ultimately she will discharge to SNF after hospitalization. Her daughter from [place] is very involved and long term  plan is for patient to move to [place] with daughter once discharged from rehab.

## 2020-04-23 NOTE — Progress Notes (Signed)
PROGRESS NOTE    Michelle Avila   WUJ:[id number]  DOB: 25-Mar-1946  DOA: 04/12/2020     10  PCP: Street, Sharon Mt, MD  CC: AMS  Hospital Course: The patient is a 74 year old chronically ill-appearing cachectic Caucasian female with a past medical history significant for hypothyroidism who presented with altered mental status, hypoglycemia, dehydration.  From report neighbors had not seen or heard from her in several days and daughter came in from out of state and found the patient on the floor in feces and was very altered.  EMS was dispatched and upon evaluation her blood sugar was 19 and so they gave her D10.  She was brought to the ED and found to be dehydrated and given 2-1/2 L of IV fluids.  CT head was obtained without acute changes.  Blood and urine cultures were ordered but the ED felt the picture was more consistent with dehydration and hypoglycemia rather than infection so she is not started on antibiotics at that time.  Her mentation largely improved in the ED and she endorsed a recent med change and was placed on a diuretic for her bilateral lower extremity edema.  Further work-up revealed that she ended up going into septic shock and hypovolemic shock from a gram-negative bacteremia likely from being in her feces and possible colitis.  She was started on IV Zosyn and changed to ciprofloxacin but I evaluated and she is on IM ceftriaxone.  She was placed on pressors and had encephalopathy and electrolyte derangements.  They were adjusted and corrected and she was given albumin x2.  She was stabilized in the ICU and had some dysphagia so she was n.p.o. and SLP evaluated and they are now recommending a dysphagia 2 diet with nectar thick liquids.  She is transferred to [facility] service 04/18/2020 and when I evaluated her today she is very frail and cachectic and likely has failure to thrive so we will consult palliative care and dietitian for further evaluation recommendations.  During  her hospitalization she had critical illness thrombocytopenia and was given 2 packs of platelets.   04/19/20: Palliative care was consulted and will see the patient today.  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  Platelet counts continue to drop and will be transfused 2 more units of platelets and I discussed the case with Dr. Alen Blew who feels that this is secondary to her septic shock and her bacteremia and recommend supportive care at this time and transfusions.  We will continue diuresis as tolerated and she will get Lasix twice today.   04/20/20: Today but still confused and wanting to go home.  Blood count has dropped so we will transfuse 1 unit of PRBCs.  WBC is trending upwards.  Potassium is on the lower side.  Renal function is relatively stable compared to yesterday.  6 twice daily again today.  Palliative care to have a goals of care discussion.  Continue on Levaquin.   04/21/2020: She remains significantly volume overloaded still is still positive over 13 L.  We'll continue IV diuresis.  Platelet count dropped again to 18 so we'll type and screen transfuse 1 unit PRBCs.  Electrolytes are abnormal and will be repeated in the setting of her diuresis.  Pulmonary signed off the case has no further recommendations   04/22/2020: Patient remains agitated but she is getting diuresed and her renal function is improved and she is 12 L positive now.  PT OT  recommending SNF.  Platelet count is still on the lower side but no active bleeding so we will continue monitor and repeat in the morning.  After further goals of care discussion the plan is for SNF rehab with palliative to follow and the plan is for the daughter to take the patient back to [place] after rehab.   Interval History:  No events overnight.  Resting in bed very confused this morning and extremely hard of hearing.  Could not answer many questions.  Did not appear to be in any  distress, just wondering why she was still in the hospital.  Old records reviewed in assessment of this patient  ROS: Review of systems not obtained due to patient factors.  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 with likely possible concomitant pneumonia -In the ED she is found to be dehydrated and given 2.5 L of IV fluids -Daughter found the patient on the floor in feces and very altered and patient initially had a blood glucose of 19 -Urine culture showing less than 10,000 colony-forming units of insignificant growth -We will change IM ceftriaxone to IV levofloxacin for now; IV Zosyn has now been stopped -WBC is improving and trending down and has gone from 27.9 -> 24.2 ->  17.3 -> 11.2 and has trended down and now trending back up slowly and gone from 9.2 is now 10.6 and today it is 10.8 but will need to monitor very carefully -Procalcitonin level went from 4.81 and trended down to 2.86  -Lactic Acid level went from 6.0 and then trended down to 1.6 -She is now off of pressors -IV fluid was resumed at 75 mL's per hour with D5 and lactated Ringer's when she was in ICU and have now given her hypoalbuminemia and likely volume overload.  -Changed ceftriaxone to IV Levofloxacin for 14 Days -We will obtain PT and OT to further evaluate and treat and have palliative care discuss goals of care given her significant failure to thrive; -PT OT evaluation done and recommending skilled nursing facility post acute hospitalization discharge  Acute Encephalopathy, waxing and waning; remains agitated -Likely multifactorial including her severe hypoglycemia on admission, dehydration, sepsis from her colitis and bacteremia -Initial head and repeat CT done had no acute changes  -She had ICU delirium and was hyperactive and hypoactive but the critical care physician feels that the delirium may be a sign that she may be  actively dying -Delirium precautions initiated -Palliative care consulted for further goals of care discussion and patient was somnolent and sleepy the day before yesterday but is again awake and alert today and agitated and wanting to go home.  Volume overload in the setting of volume resuscitation as well as acute on chronic diastolic CHF -In the setting of her poor nutritional status and possible liver dysfunction -She appeared dry yesterday and so she was initiated on D5 in LR but to me she appears extremely volume overloaded so I stopped her fluid hydration -Now that she has been placed on a diet will stop IV fluid hydration and continue to monitor closely -We will give her IV Lasix and give IV Lasix this morning and again this afternoon  -Echocardiogram showed a normal EF but grade 2 diastolic dysfunction -BNP was elevated at 1,113.0 and is now improving to 437.2 -Strict I's and O's and daily weights -Patient was +20 pound since admission and is now improving with diuresis and is down 4 pounds from the  day before yesterday and is now down to 119.  Repeat weight has not been done today. -Now + 11.528 L and will continue diuresis.  He was positive almost 15 L a few days ago -Continue with diuresis as tolerated given her blood pressure and will get IV Lasix 40 mg twice daily and continue for now -We will reconsult pulmonary and continue diuresis and consider cardiology consultation -Pulmonary had no further recommendations and has signed off  Acute Respiratory Failure with Hypoxia, stable -The setting of above next-patient also has been aspirating likely -Continue Levaquin and diuresis -Chest x-ray done yesterday and showed "Extensive airspace disease with asymmetry favoring infection over edema. There are pleural effusions, greater on the left, and there may be superimposed failure." -Chest x-ray 04/21/2020 showed "Stable bilateral pulmonary edema and pleural effusions." -Continue with  IV Levaquin for infection -we will add pulmonary toileting, guaifenesin 1200 mg p.o. twice daily, flutter valve, incentive spirometer, Xopenex and Atrovent breathing treatments -She is on 4 L supplemental oxygen via nasal cannula -SpO2: 96 % O2 Flow Rate (L/min): 5 L/min-continue weaning as tolerated -Continuous pulse oximetry maintain O2 saturation greater than 90% -Continue supplemental oxygen via nasal cannula wean as tolerated next-we will need ambulatory home O2 screen prior to discharge -Reconsulted pulmonary for further evaluation recommendations -Repeat chest x-ray this AM showed "Note that portions of the left upper lobe obscured by the patient's mandible. Pleural effusions are noted bilaterally, larger on the left than on the right, stable. Suspect atelectasis and potential superimposed airspace consolidation left lower lobe and lingular regions, stable. Atelectatic change right base, stable. Stable cardiac silhouette. Central catheter tip in superior vena cava near cavoatrial junction. No pneumothorax evident. Aortic Atherosclerosis ." -Continue chest PT and flutter valve and incentive spirometry -Appreciate pulmonary recommendations and now they have signed off the case  Dysphagia -Patient has moderately severe oral pharyngeal dysphagia; today SLP evaluated and recommended n.p.o. 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 -Have an Unknown Baseline -BUN/Cr slowly improved from admission but is relatively stable the last few days and is now improving  finally; BUN/creatinine has been ranging around 2 and is now improved with diuresis of this 45/1.55 -> 38/1.19 -IV fluids were going when I pick the patient up and will stop them given her extreme volume overload and have started diuresis -Avoid further nephrotoxic medications, contrast dyes, hypotension and renally dose medications but will give her diuretics given her volume overload and had gotten them twice daily today and will give him IV again twice daily tomorrow -Repeat CMP in the a.m.  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 with no role for anticoagulation at this time given her severe thrombocytopenia -We will try diuresis given her significant volume overload in the setting of heart failure and hypoalbuminemia -We will continue to monitor  Severe Protein Calorie Malnutrition/Failure to Thrive -Now placed on a dysphagia 2 diet -  We will consult nutritionist for further evaluation recommendations  Hyperbilirubinemia -Patient's T bili went from 0.5 on admission and has trended up to 1.5 is now trending back down to 0.9 yesterday and today is 1.0 -Liver Doppler done and as below -Continue monitor and trend and repeat CMP in the a.m.   Hypothyroidism -Patient's TSH was 9.921 and free T4 0.67 -Her levothyroxine has been resumed at 112 mcg   Abnormal LFTs, improving -Still somewhat elevated but trending down from earlier on admission -LFTs have now normalized -Liver Doppler done on 10.18 showed "Widely patent hepatic vasculature with normal directional flow. Nonspecific atrophy of the left lobe of the liver as demonstrated on preceding abdominal CT. No discrete hepatic lesions. Trace amount of perihepatic ascites and small/trace bilateral effusions as was demonstrated on preceding  abdominal CT. Cholelithiasis without evidence of cholecystitis." -Continue monitor and trend and repeat CMP in a.m.  Thrombocytopenia -Patient's Platelet Count dropped to 5 and now slowly trending up as it has gone to 30 -> 11 -> 49 and trended back down to 15 and then 10 yesterday.  Will transfuse 2 more packs of platelets and is improved to 70 yesterday but dropped again so we'll transfuse 1 L pack of platelets today given that her platelet count was 18 yesterday and was transfused 1 more unit of packed platelets and is now 29 -In the Setting of Septic Shock and her gram-negative bacteremia and I spoke with oncology who feels that this is secondary to her infection and not related to HUS TTP or ITP -She is transfused 5  packs of platelets total and may consider giving her more in the morning -There is low suspicion for HIT given the timing but Lovenox has been discontinued -Continue to Monitor for S/Sx of Bleeding; Currently no overt bleeding noted -Repeat CBC in the AM  Hypokalemia -Patient's K+ was 3.6 -Check Mag Level but was 2.1 -Continue to Monitor and Replete as Necessary -Repeat CMP  Normocytic anemia/anemia of critical illness -Patient's hemoglobin/mother went from 8.2/20.4 trended down to 7.9/26.0 today 7.0/22.9 and we will transfuse 1 unit PRBCs  -Now posttransfusion her hemoglobin/hematocrit is stable at 8.5/27.1 yesterday and today it is 8.1/26.0 after the 1 unit -we will give Lasix before and after next-continue to monitor for signs and symptoms of bleeding; currently no overt bleeding noted -Repeat CBC in a.m.  Hyponatremia -Mild -Repeat BMP in a.m.  Antimicrobials:   DVT prophylaxis: SCD Code Status: DNR Family Communication: None present Disposition Plan: Status is: Inpatient  Remains inpatient appropriate because:Unsafe d/c plan, IV treatments appropriate due to intensity of illness or inability to take PO and Inpatient level of care appropriate due to  severity of illness   Dispo: The patient is from: Home              Anticipated d/c is to: SNF              Anticipated d/c date is: 3 days              Patient currently is not medically stable to d/c.       Objective: Blood pressure 136/66, pulse 77, temperature 98.1 F (36.7 C), temperature source Oral, resp. rate 18, height 5\' 5"  (1.651 m), weight 50.9 kg, SpO2 92 %.  Examination: General appearance: Confused elderly woman lying in bed in no distress Head: Normocephalic, without obvious abnormality, atraumatic Eyes: EOMI Lungs: clear to auscultation bilaterally Heart: regular rate and rhythm and S1, S2 normal Abdomen: normal findings:  bowel sounds normal and soft, non-tender Extremities: Right foot is shriveled, cold to the touch, hardened and she is unable to move it and there is no sensation Skin: Diffuse scattered bruising throughout Neurologic: Moves all 4 extremities.  Confusion noted at baseline  Consultants:   PCCM  Palliative care medicine  Procedures:     Data Reviewed: I have personally reviewed following labs and imaging studies Results for orders placed or performed during the hospital encounter of 04/12/20 (from the past 24 hour(s))  Glucose, capillary     Status: Abnormal   Collection Time: 04/22/20  9:19 PM  Result Value Ref Range   Glucose-Capillary 106 (H) 70 - 99 mg/dL  Glucose, capillary     Status: None   Collection Time: 04/22/20 11:59 PM  Result Value Ref Range   Glucose-Capillary 94 70 - 99 mg/dL  CBC with Differential/Platelet     Status: Abnormal   Collection Time: 04/23/20  3:30 AM  Result Value Ref Range   WBC 12.0 (H) 4.0 - 10.5 K/uL   RBC 2.80 (L) 3.87 - 5.11 MIL/uL   Hemoglobin 7.6 (L) 12.0 - 15.0 g/dL   HCT 24.7 (L) 36 - 46 %   MCV 88.2 80.0 - 100.0 fL   MCH 27.1 26.0 - 34.0 pg   MCHC 30.8 30.0 - 36.0 g/dL   RDW 24.1 (H) 11.5 - 15.5 %   Platelets 19 (LL) 150 - 400 K/uL   nRBC 0.0 0.0 - 0.2 %   Neutrophils Relative % 89 %    Neutro Abs 10.6 (H) 1.7 - 7.7 K/uL   Lymphocytes Relative 6 %   Lymphs Abs 0.7 0.7 - 4.0 K/uL   Monocytes Relative 4 %   Monocytes Absolute 0.5 0.1 - 1.0 K/uL   Eosinophils Relative 0 %   Eosinophils Absolute 0.0 0.0 - 0.5 K/uL   Basophils Relative 0 %   Basophils Absolute 0.0 0.0 - 0.1 K/uL   Immature Granulocytes 1 %   Abs Immature Granulocytes 0.10 (H) 0.00 - 0.07 K/uL  Comprehensive metabolic panel     Status: Abnormal   Collection Time: 04/23/20  3:30 AM  Result Value Ref Range   Sodium 147 (H) 135 - 145 mmol/L   Potassium 3.3 (L) 3.5 - 5.1 mmol/L   Chloride 105 98 - 111 mmol/L   CO2 34 (H) 22 - 32 mmol/L   Glucose, Bld 87 70 - 99 mg/dL   BUN 30 (H) 8 - 23 mg/dL   Creatinine, Ser 0.98 0.44 - 1.00 mg/dL   Calcium 7.2 (L) 8.9 - 10.3 mg/dL   Total Protein 4.8 (L) 6.5 - 8.1 g/dL   Albumin 1.9 (L) 3.5 - 5.0 g/dL   AST 36 15 - 41 U/L   ALT 38 0 - 44 U/L   Alkaline Phosphatase 333 (H) 38 - 126 U/L   Total Bilirubin 0.9 0.3 - 1.2 mg/dL   GFR, Estimated >60 >60 mL/min   Anion gap 8 5 - 15  Phosphorus     Status: None   Collection Time: 04/23/20  3:30 AM  Result Value Ref Range   Phosphorus 3.3 2.5 - 4.6 mg/dL  Magnesium     Status: None   Collection Time: 04/23/20  3:30 AM  Result Value Ref Range   Magnesium 1.8 1.7 - 2.4 mg/dL  Glucose, capillary     Status: None   Collection Time: 04/23/20  3:39 AM  Result Value Ref Range   Glucose-Capillary 81 70 - 99  mg/dL  Glucose, capillary     Status: None   Collection Time: 04/23/20  7:46 AM  Result Value Ref Range   Glucose-Capillary 84 70 - 99 mg/dL  Glucose, capillary     Status: Abnormal   Collection Time: 04/23/20 12:03 PM  Result Value Ref Range   Glucose-Capillary 184 (H) 70 - 99 mg/dL  Glucose, capillary     Status: Abnormal   Collection Time: 04/23/20  4:50 PM  Result Value Ref Range   Glucose-Capillary 165 (H) 70 - 99 mg/dL    Recent Results (from the past 240 hour(s))  C Difficile Quick Screen w PCR reflex      Status: None   Collection Time: 04/14/20 11:40 AM   Specimen: Stool  Result Value Ref Range Status   C Diff antigen NEGATIVE NEGATIVE Final   C Diff toxin NEGATIVE NEGATIVE Final   C Diff interpretation No C. difficile detected.  Final    Comment: Performed at [facility], [place] [address], [place], [place] [address]     Radiology Studies: DG CHEST PORT 1 VIEW  Result Date: 04/23/2020 CLINICAL DATA:  Shortness of breath EXAM: PORTABLE CHEST 1 VIEW COMPARISON:  Yesterday FINDINGS: PICC on the right with tip at the upper cavoatrial junction. Hazy opacification of the left more than right chest from pleural fluid and presumed atelectasis. Left-sided effusion appears moderate or large. No visible pneumothorax. Normal heart size. Granulomatous nodal calcifications. IMPRESSION: Pleural effusions with opacified or obscured lungs. The left effusion appears moderate or large. Electronically Signed   By: Monte Fantasia M.D.   On: 04/23/2020 08:03   DG CHEST PORT 1 VIEW  Result Date: 04/22/2020 CLINICAL DATA:  Shortness of breath EXAM: PORTABLE CHEST 1 VIEW COMPARISON:  April 21, 2020 FINDINGS: The patient's mandible obscures portions of the left upper lobe. There is a persistent fairly sizable left pleural effusion with a small right pleural effusion. There is atelectasis with suspected consolidation in portions of the left lower lobe and lingula. Atelectatic changes noted in the right lower lung region. Central catheter tip is in the superior vena cava near the cavoatrial junction. Heart is upper normal in size with pulmonary vascularity normal. There is aortic atherosclerosis. No bone lesions. IMPRESSION: Note that portions of the left upper lobe obscured by the patient's mandible. Pleural effusions are noted bilaterally, larger on the left than on the right, stable. Suspect atelectasis and potential superimposed airspace consolidation left lower lobe and lingular regions, stable.  Atelectatic change right base, stable. Stable cardiac silhouette. Central catheter tip in superior vena cava near cavoatrial junction. No pneumothorax evident. Aortic Atherosclerosis (ICD10-I70.0). Electronically Signed   By: Lowella Grip III M.D.   On: 04/22/2020 07:59   DG CHEST PORT 1 VIEW  Final Result    DG CHEST PORT 1 VIEW  Final Result    DG CHEST PORT 1 VIEW  Final Result    DG CHEST PORT 1 VIEW  Final Result    DG CHEST PORT 1 VIEW  Final Result    DG Swallowing Func-Speech Pathology  Final Result    CT HEAD WO CONTRAST  Final Result    [place] EKG SITE RITE  Final Result    VAS [place] LOWER EXTREMITY VENOUS (DVT)  Final Result    US LIVER DOPPLER  Final Result    CT ABDOMEN PELVIS WO CONTRAST  Final Result    CT TIBIA FIBULA LEFT WO CONTRAST  Final Result    DG Chest  Port 1 View  Final Result    CT Head Wo Contrast  Final Result      Scheduled Meds: . sodium chloride   Intravenous Once  . Chlorhexidine Gluconate Cloth  6 each Topical Daily  . collagenase   Topical Daily  . feeding supplement  237 mL Oral BID BM  . furosemide  40 mg Intravenous BID  . guaiFENesin  1,200 mg Oral BID  . levothyroxine  112 mcg Oral Q0600  . mouth rinse  15 mL Mouth Rinse BID  . potassium chloride  40 mEq Oral BID  . sodium chloride flush  10-40 mL Intracatheter Q12H   PRN Meds: acetaminophen, docusate sodium, ipratropium, levalbuterol, ondansetron (ZOFRAN) IV, polyethylene glycol, Resource ThickenUp Clear, sodium chloride flush Continuous Infusions: . sodium chloride 250 mL (04/13/20 0414)  . levofloxacin (LEVAQUIN) IV 750 mg (04/22/20 1645)     LOS: 10 days  Time spent: Greater than 50% of the 35 minute visit was spent in counseling/coordination of care for the patient as laid out in the A&P.   Dwyane Dee, MD [facility] 04/23/2020, 4:54 PM

## 2020-04-23 NOTE — Plan of Care (Signed)
  Problem: Education: Goal: Knowledge of General Education information will improve Description Including pain rating scale, medication(s)/side effects and non-pharmacologic comfort measures Outcome: Progressing   

## 2020-04-24 DIAGNOSIS — I998 Other disorder of circulatory system: Secondary | ICD-10-CM

## 2020-04-24 DIAGNOSIS — Z515 Encounter for palliative care: Secondary | ICD-10-CM | POA: Diagnosis not present

## 2020-04-24 DIAGNOSIS — A419 Sepsis, unspecified organism: Secondary | ICD-10-CM | POA: Diagnosis not present

## 2020-04-24 DIAGNOSIS — R4182 Altered mental status, unspecified: Secondary | ICD-10-CM

## 2020-04-24 DIAGNOSIS — R531 Weakness: Secondary | ICD-10-CM | POA: Diagnosis not present

## 2020-04-24 DIAGNOSIS — R6521 Severe sepsis with septic shock: Secondary | ICD-10-CM | POA: Diagnosis not present

## 2020-04-24 DIAGNOSIS — Z7189 Other specified counseling: Secondary | ICD-10-CM | POA: Diagnosis not present

## 2020-04-24 LAB — COMPREHENSIVE METABOLIC PANEL
ALT: 40 U/L (ref 0–44)
AST: 52 U/L — ABNORMAL HIGH (ref 15–41)
Albumin: 1.8 g/dL — ABNORMAL LOW (ref 3.5–5.0)
Alkaline Phosphatase: 371 U/L — ABNORMAL HIGH (ref 38–126)
Anion gap: 9 (ref 5–15)
BUN: 22 mg/dL (ref 8–23)
CO2: 35 mmol/L — ABNORMAL HIGH (ref 22–32)
Calcium: 7.5 mg/dL — ABNORMAL LOW (ref 8.9–10.3)
Chloride: 104 mmol/L (ref 98–111)
Creatinine, Ser: 0.9 mg/dL (ref 0.44–1.00)
GFR, Estimated: >60 mL/min (ref >60)
Glucose, Bld: 83 mg/dL (ref 70–99)
Potassium: 4.5 mmol/L (ref 3.5–5.1)
Sodium: 148 mmol/L — ABNORMAL HIGH (ref 135–145)
Total Bilirubin: 1 mg/dL (ref 0.3–1.2)
Total Protein: 4.9 g/dL — ABNORMAL LOW (ref 6.5–8.1)

## 2020-04-24 LAB — CBC WITH DIFFERENTIAL/PLATELET
Abs Immature Granulocytes: 0.1 10*3/uL — ABNORMAL HIGH (ref 0.00–0.07)
Basophils Absolute: 0 10*3/uL (ref 0.0–0.1)
Basophils Relative: 0 %
Eosinophils Absolute: 0.1 10*3/uL (ref 0.0–0.5)
Eosinophils Relative: 1 %
HCT: 24.8 % — ABNORMAL LOW (ref 36.0–46.0)
Hemoglobin: 7.8 g/dL — ABNORMAL LOW (ref 12.0–15.0)
Immature Granulocytes: 1 %
Lymphocytes Relative: 8 %
Lymphs Abs: 0.9 10*3/uL (ref 0.7–4.0)
MCH: 28.3 pg (ref 26.0–34.0)
MCHC: 31.5 g/dL (ref 30.0–36.0)
MCV: 89.9 fL (ref 80.0–100.0)
Monocytes Absolute: 0.6 10*3/uL (ref 0.1–1.0)
Monocytes Relative: 5 %
Neutro Abs: 9.6 10*3/uL — ABNORMAL HIGH (ref 1.7–7.7)
Neutrophils Relative %: 85 %
Platelets: 19 10*3/uL — CL (ref 150–400)
RBC: 2.76 MIL/uL — ABNORMAL LOW (ref 3.87–5.11)
RDW: 25 % — ABNORMAL HIGH (ref 11.5–15.5)
WBC: 11.2 10*3/uL — ABNORMAL HIGH (ref 4.0–10.5)
nRBC: 0 % (ref 0.0–0.2)

## 2020-04-24 LAB — MAGNESIUM: Magnesium: 1.6 mg/dL — ABNORMAL LOW (ref 1.7–2.4)

## 2020-04-24 LAB — GLUCOSE, CAPILLARY
Glucose-Capillary: 122 mg/dL — ABNORMAL HIGH (ref 70–99)
Glucose-Capillary: 57 mg/dL — ABNORMAL LOW (ref 70–99)
Glucose-Capillary: 58 mg/dL — ABNORMAL LOW (ref 70–99)
Glucose-Capillary: 67 mg/dL — ABNORMAL LOW (ref 70–99)
Glucose-Capillary: 84 mg/dL (ref 70–99)
Glucose-Capillary: 85 mg/dL (ref 70–99)
Glucose-Capillary: 87 mg/dL (ref 70–99)
Glucose-Capillary: 90 mg/dL (ref 70–99)

## 2020-04-24 LAB — TYPE AND SCREEN
ABO/RH(D): O POS
Antibody Screen: NEGATIVE

## 2020-04-24 LAB — PHOSPHORUS: Phosphorus: 3 mg/dL (ref 2.5–4.6)

## 2020-04-24 MED ORDER — MAGNESIUM OXIDE 400 (241.3 MG) MG PO TABS
800.0000 mg | ORAL_TABLET | Freq: Once | ORAL | Status: AC
  Administered 2020-04-24: 800 mg via ORAL
  Filled 2020-04-24: qty 2

## 2020-04-24 MED ORDER — LEVOFLOXACIN 500 MG PO TABS
750.0000 mg | ORAL_TABLET | ORAL | Status: DC
  Administered 2020-04-24 – 2020-04-26 (×2): 750 mg via ORAL
  Filled 2020-04-24: qty 2
  Filled 2020-04-24: qty 1

## 2020-04-24 MED ORDER — SODIUM CHLORIDE 0.9% IV SOLUTION: Freq: Once | INTRAVENOUS | Status: DC

## 2020-04-24 NOTE — Progress Notes (Signed)
Physical Therapy Treatment Patient Details Name: Michelle Avila MRN: [id number] DOB: 09-11-1945 Today's Date: 04/24/2020    History of Present Illness 74 y.o. female  admitted after her dtr found her in feces on the floor in an altered state, septic, required brief time on pressors and continues to have encephalopathy and electrolyte derangements.  She was stabilized in the ICU, transferred the medical floor however continues to be confused. past medical history of hypothyroidism admitted on 04/12/2020 with altered mental status, hyperglycemia, dehydration.    PT Comments    Pt removed nasal cannula with 4L O2 upon arrival and SpO2 80%, returned 4L and SpO2 91% within 1 minute of pursed lip breathing. Pt agreeable to transfer to bedside chair, requires cues for attention to task, repeating cues and redirection due to occasional tangential speech. While transferring to seated EOB, pt noted to have significant BLE drainage R>L. Pt requires max assist x2 to stand EOB, pt fearful of falling and unable to complete transfer to bedside chair. Total assist for squat pivot transfer to bedside chair, therapist providing sequencing cues throughout to ease pt's fear of falling and tech guiding pt into chair. Pt pleasant throughout session, requiring increased time and cues with mobility. NT in room at EOS, chair alarm in chair and call bell at side. RN notified of BLE drainage.   Follow Up Recommendations  SNF;Supervision/Assistance - 24 hour     Equipment Recommendations  None recommended by PT (defer to SNF)    Recommendations for Other Services       Precautions / Restrictions Precautions Precautions: Fall Precaution Comments: BLE weeping Restrictions Weight Bearing Restrictions: No    Mobility  Bed Mobility Overal bed mobility: Needs Assistance Bed Mobility: Supine to Sit  Supine to sit: Mod assist;HOB elevated  General bed mobility comments: mod A to upright trunk, able to  initiate BLE towards EOB but ultimately requires assistance to clear off EOB  Transfers Overall transfer level: Needs assistance Equipment used: None Transfers: Sit to/from [employer] Sit to Stand: Max assist   Squat pivot transfers: Total assist  General transfer comment: max A to stand at EOB with 2 person assist, too fearful to transfer to bedside chair so performed squat pivot with therapist infront of pt and tech guiding pt to bedside chair  Ambulation/Gait  General Gait Details: unable   Stairs             Wheelchair Mobility    Modified Rankin (Stroke Patients Only)       Balance Overall balance assessment: Needs assistance Sitting-balance support: Feet supported;Bilateral upper extremity supported Sitting balance-Leahy Scale: Fair Sitting balance - Comments: significant posterior pelvic tilt with kyphotic sitting posture, leaning laterally due to fatigue despite cues for upright posture   Standing balance support: During functional activity Standing balance-Leahy Scale: Zero Standing balance comment: max A to stand     Cognition Arousal/Alertness: Awake/alert Behavior During Therapy: WFL for tasks assessed/performed Overall Cognitive Status: No family/caregiver present to determine baseline cognitive functioning  Following Commands: Follows one step commands inconsistently;Follows one step commands with increased time  General Comments: Occasional tangential speech, requires repeated cues and increased time to perform, redirection to task      Exercises      General Comments General comments (skin integrity, edema, etc.): Pt with 4L O2, but nasal canula removed and SpO2 80% upon arrival, returend 4L nasal canula with SpO2 92% with mobility      Pertinent Vitals/Pain Pain Assessment: Faces Faces Pain  Scale: Hurts little more Pain Location: BLE with WB Pain Descriptors / Indicators: Grimacing;Guarding;Moaning Pain Intervention(s):  Limited activity within patient's tolerance;Monitored during session;Repositioned    Home Living                      Prior Function            PT Goals (current goals can now be found in the care plan section) Acute Rehab PT Goals Patient Stated Goal: pt unable to state PT Goal Formulation: With patient Time For Goal Achievement: 05/05/20 Potential to Achieve Goals: Fair Progress towards PT goals: Progressing toward goals    Frequency    Min 2X/week      PT Plan Current plan remains appropriate    Co-evaluation              AM-PAC PT "6 Clicks" Mobility   Outcome Measure  Help needed turning from your back to your side while in a flat bed without using bedrails?: A Little Help needed moving from lying on your back to sitting on the side of a flat bed without using bedrails?: A Little Help needed moving to and from a bed to a chair (including a wheelchair)?: Total Help needed standing up from a chair using your arms (e.g., wheelchair or bedside chair)?: Total Help needed to walk in hospital room?: Total Help needed climbing 3-5 steps with a railing? : Total 6 Click Score: 10    End of Session   Activity Tolerance: Patient limited by fatigue;Patient limited by pain Patient left: in chair;with call bell/phone within reach;with chair alarm set;with nursing/sitter in room (chair alarm needing new batteries- NT getting them) Nurse Communication: Mobility status;Other (comment) (BLE drainage) PT Visit Diagnosis: Difficulty in walking, not elsewhere classified (R26.2);Muscle weakness (generalized) (M62.81)     Time: 8657-8469 PT Time Calculation (min) (ACUTE ONLY): 24 min  Charges:  $Therapeutic Activity: 23-37 mins                      Tori Maridel Pixler PT, DPT 04/24/20, 1:26 PM

## 2020-04-24 NOTE — Consult Note (Signed)
Hospital Consult    Reason for Consult: Lower extremity tissue loss Referring Physician: Hospitalist MRN #:  [id number]  History of Present Illness: This is a 74 y.o. female that was admitted with altered mental status after being found down following a fall at home and then ultimately having developed septic shock with gram-negative bacteremia and possible pneumonia  Ultimately she has had a prolonged hospital stay over the last 11 days.  Vascular surgery was called to evaluate her right lower extremity today.  Patient is unclear about the timeline of tissue loss in the right foot.  Unable to wiggle her toes and has no sensation.  She denies any previous vascular surgery interventions.  States she used to smoke but has since quit.  States she lives at home with her daughter and is ambulatory.  She also has tissue loss on the left leg.  Able to wiggle her toes and has normal sensation in the left foot.  History reviewed. No pertinent past medical history.  PSH: Denies  Prior to Admission medications   Medication Sig Start Date End Date Taking? Authorizing Provider  acetaminophen (TYLENOL) 325 MG tablet Take 650 mg by mouth every 6 (six) hours as needed for mild pain or fever.   Yes [provider]  levothyroxine (SYNTHROID) 112 MCG tablet Take 112 mcg by mouth every morning. 03/27/20  Yes [provider]  torsemide (DEMADEX) 10 MG tablet Take 5-10 mg by mouth daily as needed (leg swelling).  01/30/20  Yes [provider]  VIRT-GARD 2.2-25-1 MG TABS Take 1 tablet by mouth at bedtime. 03/27/20  Yes [provider]    Social History   Socioeconomic History  . Marital status: Widowed    Spouse name: Not on file  . Number of children: Not on file  . Years of education: Not on file  . Highest education level: Not on file  Occupational History  . Not on file  Tobacco Use  . Smoking status: Not on file  Substance and Sexual Activity  . Alcohol use: Not on  file  . Drug use: Not on file  . Sexual activity: Not on file  Other Topics Concern  . Not on file  Social History Narrative  . Not on file   Social Determinants of Health   Financial Resource Strain:   . Difficulty of Paying Living Expenses: Not on file  Food Insecurity:   . Worried About Charity fundraiser in the Last Year: Not on file  . Ran Out of Food in the Last Year: Not on file  Transportation Needs:   . Lack of Transportation (Medical): Not on file  . Lack of Transportation (Non-Medical): Not on file  Physical Activity:   . Days of Exercise per Week: Not on file  . Minutes of Exercise per Session: Not on file  Stress:   . Feeling of Stress : Not on file  Social Connections:   . Frequency of Communication with Friends and Family: Not on file  . Frequency of Social Gatherings with Friends and Family: Not on file  . Attends Religious Services: Not on file  . Active Member of Clubs or Organizations: Not on file  . Attends Archivist Meetings: Not on file  . Marital Status: Not on file  Intimate Partner Violence:   . Fear of Current or Ex-Partner: Not on file  . Emotionally Abused: Not on file  . Physically Abused: Not on file  . Sexually Abused: Not on file  No family history on file.  ROS: [x]  Positive   [ ]  Negative   [ ]  All sytems reviewed and are negative  Cardiovascular: []  chest pain/pressure []  palpitations []  SOB lying flat []  DOE []  pain in legs while walking []  pain in legs at rest []  pain in legs at night []  non-healing ulcers []  hx of DVT []  swelling in legs  Pulmonary: []  productive cough []  asthma/wheezing []  home O2  Neurologic: []  weakness in []  arms []  legs []  numbness in []  arms []  legs []  hx of CVA []  mini stroke [] difficulty speaking or slurred speech []  temporary loss of vision in one eye []  dizziness  Hematologic: []  hx of cancer []  bleeding problems []  problems with blood clotting easily  Endocrine:    []  diabetes []  thyroid disease  GI []  vomiting blood []  blood in stool  GU: []  CKD/renal failure []  HD--[]  M/W/F or []  T/T/S []  burning with urination []  blood in urine  Psychiatric: []  anxiety []  depression  Musculoskeletal: []  arthritis []  joint pain  Integumentary: []  rashes []  ulcers  Constitutional: []  fever []  chills   Physical Examination  Vitals:   04/24/20 1221 04/24/20 1339  BP: (!) 104/58 138/72  Pulse: 87 90  Resp: 20 20  Temp: 98.4 F (36.9 C) 98.2 F (36.8 C)  SpO2: 91% 94%   Body mass index is 18.67 kg/m.  General:  NAD Gait: Not observed HENT: WNL, normocephalic Pulmonary: normal non-labored breathing Cardiac: RRR Abdomen: soft, NT/ND Vascular Exam/Pulses: Bilateral femoral pulses palpable Left popliteal pulse palpable Left DP palpable and left foot is motor and sensory intact Right foot is cadaveric with no palpable pulses and cold and no motor or sensation Musculoskeletal: muscle wasting of right foot Neurologic: A&O X 3; no motor or sensation in right foot.        CBC    Component Value Date/Time   WBC 11.2 (H) 04/24/2020 0402   RBC 2.76 (L) 04/24/2020 0402   HGB 7.8 (L) 04/24/2020 0402   HCT 24.8 (L) 04/24/2020 0402   PLT 19 (LL) 04/24/2020 0402   MCV 89.9 04/24/2020 0402   MCH 28.3 04/24/2020 0402   MCHC 31.5 04/24/2020 0402   RDW 25.0 (H) 04/24/2020 0402   LYMPHSABS 0.9 04/24/2020 0402   MONOABS 0.6 04/24/2020 0402   EOSABS 0.1 04/24/2020 0402   BASOSABS 0.0 04/24/2020 0402    BMET    Component Value Date/Time   NA 148 (H) 04/24/2020 0402   K 4.5 04/24/2020 0402   CL 104 04/24/2020 0402   CO2 35 (H) 04/24/2020 0402   GLUCOSE 83 04/24/2020 0402   BUN 22 04/24/2020 0402   CREATININE 0.90 04/24/2020 0402   CALCIUM 7.5 (L) 04/24/2020 0402   GFRNONAA >60 04/24/2020 0402    COAGS: Lab Results  Component Value Date   INR 1.6 (H) 04/14/2020   INR 1.6 (H) 04/14/2020     Non-Invasive Vascular Imaging:     None    ASSESSMENT/PLAN: This is a 74 y.o. female admitted after being found down with altered mental status subsequently having septic shock with gram-negative bacteremia and possible pneumonia.  Vascular surgery has been consulted to evaluate lower extremity tissue loss.  As I discussed with patient today her right leg is not salvageable and appears cadaveric on exam.  She has no motor or sensation in the right foot and the foot is cold.  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  She does have good femoral pulse on the right and likely would have a good chance of healing this.  In the left lower extremity she has a palpable femoral, popliteal and dorsalis pedis pulse.  Seems to have adequate perfusion for wound healing.  She will need wound care evaluation for recommendations.  Discussed if she decides to proceed with above-knee amputation on the right will need to be transferred to [facility] given our vascular surgery service does not perform surgery at [facility].  Ultimately she feels she needs time to think about her options.    Marty Heck, MD [facility] Office: [place]

## 2020-04-24 NOTE — Progress Notes (Signed)
  Speech Language Pathology Treatment: Dysphagia  Patient Details Name: Michelle Avila MRN: [id number] DOB: 06-28-1946 Today's Date: 04/24/2020 Time: 0940-1000 SLP Time Calculation (min) (ACUTE ONLY): 20 min  Assessment / Plan / Recommendation Clinical Impression  Pt seen at bedside for follow up after advancing liquids 04/23/20. Pt seated upright in bed, awake, alert, pleasant and cooperative again today. Pt accepted trials of thin liquid via straw, and did not exhibit overt s/s aspiration after any bolus presentation. Pt reports having a poor appetite, and did not seem interested in advancing solid textures from Dys2. SLP will continue to follow to assess readiness to advance solid textures to Dys3. Safe swallow precautions remain at [facility].    HPI HPI: Pt presented with AMS, was found down covered in feces at home by her neighbors.  PMH for smoking, Pneumonitis due to inhalation of food and vomit in 2016, Pressure ulcer of sacral region, stage 1, Acute respiratory failure with hypoxia, protein-calorie malnutrition, bacterial pneumonia, was living alone, Diarrhea, Fluid overload, Altered mental status, Klebsiella pneumoniae (k. pneumoniae), Pure hypercholesterolemia, Essential (primary) hypertension, Hypotension, Tobacco use, bilirubin metabolism, Escherichia coli (E. coli), , Hypomagnesemia, Hypokalemia, Anemia, unspecified, Body mass index (BMI) 19.9 or less, adult malaise.      SLP Plan  Continue with current plan of care       Recommendations  Diet recommendations: Dysphagia 2 (fine chop);Thin liquid Liquids provided via: Cup;Straw Medication Administration: Crushed with puree Supervision: Patient able to self feed;Staff to assist with self feeding;Full supervision/cueing for compensatory strategies Compensations: Slow rate;Small sips/bites;Use straw to facilitate chin tuck;Chin tuck Postural Changes and/or Swallow Maneuvers: Seated upright 90 degrees;Upright 30-60 min after meal                 Oral Care Recommendations: Oral care BID Follow up Recommendations: Skilled Nursing facility;24 hour supervision/assistance SLP Visit Diagnosis: Dysphagia, pharyngoesophageal phase (R13.14);Dysphagia, oropharyngeal phase (R13.12) Plan: Continue with current plan of care       [place] B. Quentin Ore, [facility], [place] Speech Language Pathologist Office: [phone] Pager: [phone]  [name] 04/24/2020, 10:01 AM

## 2020-04-24 NOTE — Care Management Important Message (Signed)
Important Message  Patient Details IM Letter given to the Patient Name: Michelle Avila MRN: [id number] Date of Birth: [birth year]   Medicare Important Message Given:  Yes     Kerin Salen 04/24/2020, 11:03 AM

## 2020-04-24 NOTE — Progress Notes (Signed)
PROGRESS NOTE    Michelle Avila   JGG:[id number]  DOB: [birth year]  DOA: 04/12/2020     11  PCP: Street, Sharon Mt, MD  CC: AMS  Hospital Course: The patient is a 74 year old chronically ill-appearing cachectic Caucasian female with a past medical history significant for hypothyroidism who presented with altered mental status, hypoglycemia, dehydration.  From report neighbors had not seen or heard from her in several days and daughter came in from out of state and found the patient on the floor in feces and was very altered.  EMS was dispatched and upon evaluation her blood sugar was 19 and so they gave her D10.  She was brought to the ED and found to be dehydrated and given 2-1/2 L of IV fluids.  CT head was obtained without acute changes.  Blood and urine cultures were ordered but the ED felt the picture was more consistent with dehydration and hypoglycemia rather than infection so she was not started on antibiotics at that time.  Her mentation largely improved in the ED and she endorsed a recent med change and was placed on a diuretic for her bilateral lower extremity edema.    Further work-up revealed that she ended up going into septic shock and hypovolemic shock from a gram-negative bacteremia likely from being in her feces and possible colitis.  She was started on IV Zosyn briefly then changed to Levaquin and then to ciprofloxacin.  She was placed on pressors and had encephalopathy and electrolyte derangements.  They were adjusted and corrected and she was given albumin x2.  She was stabilized in the ICU and had some dysphagia so she was n.p.o. and SLP evaluated and they are now recommending a dysphagia 2 diet with nectar thick liquids.   She is transferred to [facility] service 04/18/2020.  During her hospitalization she had critical illness thrombocytopenia and was given 2 packs of platelets.   04/19/20: Palliative care was consulted and will see the patient today.  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  Platelet counts continue to drop and will be transfused 2 more units of platelets and I discussed the case with Dr. Alen Blew who feels that this is secondary to her septic shock and her bacteremia and recommend supportive care at this time and transfusions.  We will continue diuresis as tolerated and she will get Lasix twice today.   04/20/20: Today but still confused and wanting to go home.  Blood count has dropped so we will transfuse 1 unit of PRBCs.  WBC is trending upwards.  Potassium is on the lower side.  Renal function is relatively stable compared to yesterday.  6 twice daily again today.  Palliative care to have a goals of care discussion.  Continue on Levaquin.   04/21/2020: She remains significantly volume overloaded still is still positive over 13 L.  We'll continue IV diuresis.  Platelet count dropped again to 18 so we'll type and screen transfuse 1 unit PRBCs.  Electrolytes are abnormal and will be repeated in the setting of her diuresis.  Pulmonary signed off the case has no further recommendations   04/22/2020: Patient remains agitated but she is getting diuresed and her renal function is improved and she is 12 L positive now.  PT OT recommending SNF.  Platelet count is still on the lower side but no active bleeding so we will continue monitor and repeat in the morning.  After further goals of care  discussion the plan is for SNF rehab with palliative to follow and the plan is for the daughter to take the patient back to [place] after rehab.   Interval History:  No events overnight.  Watching TV this morning but remains in her usual confused state but does carry on conversation.  Does not appear to be in any distress.  Very pleasant.  Old records reviewed in assessment of this patient  ROS: Review of systems not obtained due to patient factors.  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 with likely possible concomitant pneumonia -In the ED she is found to be dehydrated and given 2.5 L of IV fluids -Daughter found the patient on the floor in feces and very altered and patient initially had a blood glucose of 19 -Urine culture showing less than 10,000 colony-forming units of insignificant growth - changed IM ceftriaxone to IV levofloxacin for now; IV Zosyn has now been stopped -WBC, PCT, and lactic all improved  -She is now off of pressors -Changed ceftriaxone to IV Levofloxacin for 14 Days -PT OT evaluation done and recommending skilled nursing facility post acute hospitalization discharge  Acute Encephalopathy, waxing and waning -Likely multifactorial including her severe hypoglycemia on admission, dehydration, sepsis from her colitis and bacteremia -Initial head and repeat CT done had no acute changes  -She had ICU delirium and was hyperactive and hypoactive but the critical care physician feels that the delirium may be a sign that she may be actively dying (for now seems to have improved) -Delirium precautions initiated  Volume overload in the setting of volume resuscitation as well as acute on chronic diastolic CHF -In the setting of her poor nutritional status and possible liver dysfunction -Echocardiogram showed a normal EF but grade 2 diastolic dysfunction -BNP was elevated at 1,113.0 and is now improving to 437.2 -Strict I's and O's and daily weights - diuresing well on IV lasix -Pulmonary had no further recommendations and has signed off  Right foot wound - right foot noted to be cold with dried skin with appearance that foot is unsalvagable; right mid leg weeping foul smelling drainage thru bandage - unclear if any intervention would be feasible or beneficial at this time other than preventing further ascending infection - will ask for vascular input; seems to be a poor surgical candidate  given co-morbidities and thrombocytopenia   Acute Respiratory Failure with Hypoxia, stable -Continue Levaquin and diuresis -Chest x-ray done and showed "Extensive airspace disease with asymmetry favoring infection over edema. There are pleural effusions, greater on the left, and there may be superimposed failure." -Chest x-ray 04/21/2020 showed "Stable bilateral pulmonary edema and pleural effusions." -Continue with IV Levaquin for infection -continue  pulmonary toileting, guaifenesin 1200 mg p.o. twice daily, flutter valve, incentive spirometer, Xopenex and Atrovent breathing treatments -continue O2, remains on 4L -Continue chest PT and flutter valve and incentive spirometry -Appreciate pulmonary recommendations and now they have signed off the case  Dysphagia -Patient has moderately severe oral pharyngeal dysphagia; today SLP evaluated and recommended n.p.o. 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   Hypothyroidism -Patient's TSH was 9.921 and free T4 0.67 -Her levothyroxine has been resumed at 112 mcg   Abnormal LFTs, improving -Still somewhat elevated but trending down from earlier on admission -LFTs have now normalized -Liver  Doppler done on 10/18 showed "Widely patent hepatic vasculature with normal directional flow. Nonspecific atrophy of the left lobe of the liver as demonstrated on preceding abdominal CT. No discrete hepatic lesions. Trace amount of perihepatic ascites and small/trace bilateral effusions as was demonstrated on preceding abdominal CT. Cholelithiasis without evidence of cholecystitis." -Continue monitor and trend and repeat CMP in a.m.  Thrombocytopenia -Patient's Platelet Count dropped to 5 and now slowly trending up but still labile  -In the Setting of Septic Shock and her gram-negative bacteremia and I spoke with oncology who feels that this is secondary to her infection and not related to HUS TTP or ITP -s/p 5 units PLT for admission - PLTC still 19k on 10/28, will transfuse 1 more unit  - continue holding chemical DVT ppx  Hypokalemia - replete and recheck as needed  Normocytic anemia/anemia of critical illness - baseline roughtly 8-9 - continue trending; transfuse as needed   Hyponatremia -Mild -Repeat BMP in a.m.  Antimicrobials:   DVT prophylaxis: SCD Code Status: DNR Family Communication: None present Disposition Plan: Status is: Inpatient  Remains inpatient appropriate because:Unsafe d/c plan, IV treatments appropriate due to intensity of illness or inability to take PO and Inpatient level of care appropriate due to severity of illness   Dispo: The patient is from: Home              Anticipated d/c is to: SNF              Anticipated d/c date is: 3 days              Patient currently is not medically stable to d/c.  Objective: Blood pressure (!) 104/58, pulse 87, temperature 98.4 F (36.9 C), temperature source Oral, resp. rate 20, height 5\' 5"  (1.651 m), weight 50.9 kg, SpO2 91 %.  Examination: General appearance: Confused elderly woman lying in bed in no distress Head: Normocephalic, without obvious abnormality, atraumatic Eyes: EOMI Lungs: clear to  auscultation bilaterally Heart: regular rate and rhythm and S1, S2 normal Abdomen: normal findings: bowel sounds normal and soft, non-tender Extremities: Right foot is shriveled, cold to the touch, hardened and she is unable to move it and there is no sensation; for all practical purposes this foot is unsalvagable and "dead". Foul smelling drainage weeping thru bandage up to mid leg on right foot as well Skin: Diffuse scattered bruising throughout Neurologic: Moves all 4 extremities.  Confusion noted at baseline  Consultants:   PCCM  Palliative care medicine  Procedures:     Data Reviewed: I have personally reviewed following labs and imaging studies Results for orders placed or performed during the hospital encounter of 04/12/20 (from the past 24 hour(s))  Glucose, capillary     Status: Abnormal   Collection Time: 04/23/20  4:50 PM  Result Value Ref Range   Glucose-Capillary 165 (H) 70 - 99 mg/dL  Glucose, capillary  Status: None   Collection Time: 04/23/20  7:53 PM  Result Value Ref Range   Glucose-Capillary 86 70 - 99 mg/dL  Glucose, capillary     Status: Abnormal   Collection Time: 04/23/20 11:41 PM  Result Value Ref Range   Glucose-Capillary 102 (H) 70 - 99 mg/dL  CBC with Differential/Platelet     Status: Abnormal   Collection Time: 04/24/20  4:02 AM  Result Value Ref Range   WBC 11.2 (H) 4.0 - 10.5 K/uL   RBC 2.76 (L) 3.87 - 5.11 MIL/uL   Hemoglobin 7.8 (L) 12.0 - 15.0 g/dL   HCT 24.8 (L) 36 - 46 %   MCV 89.9 80.0 - 100.0 fL   MCH 28.3 26.0 - 34.0 pg   MCHC 31.5 30.0 - 36.0 g/dL   RDW 25.0 (H) 11.5 - 15.5 %   Platelets 19 (LL) 150 - 400 K/uL   nRBC 0.0 0.0 - 0.2 %   Neutrophils Relative % 85 %   Neutro Abs 9.6 (H) 1.7 - 7.7 K/uL   Lymphocytes Relative 8 %   Lymphs Abs 0.9 0.7 - 4.0 K/uL   Monocytes Relative 5 %   Monocytes Absolute 0.6 0.1 - 1.0 K/uL   Eosinophils Relative 1 %   Eosinophils Absolute 0.1 0.0 - 0.5 K/uL   Basophils Relative 0 %   Basophils  Absolute 0.0 0.0 - 0.1 K/uL   Immature Granulocytes 1 %   Abs Immature Granulocytes 0.10 (H) 0.00 - 0.07 K/uL  Comprehensive metabolic panel     Status: Abnormal   Collection Time: 04/24/20  4:02 AM  Result Value Ref Range   Sodium 148 (H) 135 - 145 mmol/L   Potassium 4.5 3.5 - 5.1 mmol/L   Chloride 104 98 - 111 mmol/L   CO2 35 (H) 22 - 32 mmol/L   Glucose, Bld 83 70 - 99 mg/dL   BUN 22 8 - 23 mg/dL   Creatinine, Ser 0.90 0.44 - 1.00 mg/dL   Calcium 7.5 (L) 8.9 - 10.3 mg/dL   Total Protein 4.9 (L) 6.5 - 8.1 g/dL   Albumin 1.8 (L) 3.5 - 5.0 g/dL   AST 52 (H) 15 - 41 U/L   ALT 40 0 - 44 U/L   Alkaline Phosphatase 371 (H) 38 - 126 U/L   Total Bilirubin 1.0 0.3 - 1.2 mg/dL   GFR, Estimated >60 >60 mL/min   Anion gap 9 5 - 15  Magnesium     Status: Abnormal   Collection Time: 04/24/20  4:02 AM  Result Value Ref Range   Magnesium 1.6 (L) 1.7 - 2.4 mg/dL  Phosphorus     Status: None   Collection Time: 04/24/20  4:02 AM  Result Value Ref Range   Phosphorus 3.0 2.5 - 4.6 mg/dL  Glucose, capillary     Status: None   Collection Time: 04/24/20  5:01 AM  Result Value Ref Range   Glucose-Capillary 84 70 - 99 mg/dL  Glucose, capillary     Status: None   Collection Time: 04/24/20  8:03 AM  Result Value Ref Range   Glucose-Capillary 85 70 - 99 mg/dL  Prepare Pheresed Platelets     Status: None (Preliminary result)   Collection Time: 04/24/20  8:12 AM  Result Value Ref Range   Unit Number [id number]    Blood Component Type PLTPHER LR1    Unit division 00    Status of Unit ISSUED    Transfusion Status      OK  TO TRANSFUSE Performed at [facility] [address], [place], [place] [address]   Type and screen [place]     Status: None   Collection Time: 04/24/20 10:05 AM  Result Value Ref Range   ABO/RH(D) O POS    Antibody Screen NEG    Sample Expiration      04/27/2020,2359 Performed at [facility], [place] [address], [place], [place] [address]   Glucose, capillary     Status: None   Collection Time: 04/24/20 12:16 PM  Result Value Ref Range   Glucose-Capillary 90 70 - 99 mg/dL    No results found for this or any previous visit (from the past 240 hour(s)).   Radiology Studies: DG CHEST PORT 1 VIEW  Result Date: 04/23/2020 CLINICAL DATA:  Shortness of breath EXAM: PORTABLE CHEST 1 VIEW COMPARISON:  Yesterday FINDINGS: PICC on the right with tip at the upper cavoatrial junction. Hazy opacification of the left more than right chest from pleural fluid and presumed atelectasis. Left-sided effusion appears moderate or large. No visible pneumothorax. Normal heart size. Granulomatous nodal calcifications. IMPRESSION: Pleural effusions with opacified or obscured lungs. The left effusion appears moderate or large. Electronically Signed   By: Monte Fantasia M.D.   On: 04/23/2020 08:03   DG CHEST PORT 1 VIEW  Final Result    DG CHEST PORT 1 VIEW  Final Result    DG CHEST PORT 1 VIEW  Final Result    DG CHEST PORT 1 VIEW  Final Result    DG CHEST PORT 1 VIEW  Final Result    DG Swallowing Func-Speech Pathology  Final Result    CT HEAD WO CONTRAST  Final Result    [place] EKG SITE RITE  Final Result    VAS [place] LOWER EXTREMITY VENOUS (DVT)  Final Result    US LIVER DOPPLER  Final Result    CT ABDOMEN PELVIS WO CONTRAST  Final Result    CT TIBIA FIBULA LEFT WO CONTRAST  Final Result    DG Chest Port 1 View  Final Result    CT Head Wo Contrast  Final Result      Scheduled Meds: . sodium chloride   Intravenous Once  . sodium chloride   Intravenous Once  . Chlorhexidine Gluconate Cloth  6 each Topical Daily  . collagenase   Topical Daily  . feeding supplement  237 mL Oral BID BM  . furosemide  40 mg Intravenous BID  . guaiFENesin  1,200 mg Oral BID  . levofloxacin  750 mg Oral Q48H  . levothyroxine  112 mcg Oral Q0600  . mouth rinse  15 mL Mouth Rinse BID  . potassium chloride  40 mEq  Oral BID  . sodium chloride flush  10-40 mL Intracatheter Q12H   PRN Meds: acetaminophen, docusate sodium, ipratropium, levalbuterol, ondansetron (ZOFRAN) IV, polyethylene glycol, Resource ThickenUp Clear, sodium chloride flush Continuous Infusions: . sodium chloride 250 mL (04/13/20 0414)     LOS: 11 days  Time spent: Greater than 50% of the 35 minute visit was spent in counseling/coordination of care for the patient as laid out in the A&P.   Dwyane Dee, MD [facility] 04/24/2020, 1:45 PM

## 2020-04-24 NOTE — Progress Notes (Signed)
Daily Progress Note   Patient Name: Michelle Avila       Date: 04/24/2020 DOB: 1946/03/20  Age: 74 y.o. MRN#: [id number] Attending Physician: Dwyane Dee, MD Primary Care Physician: Street, Sharon Mt, MD Admit Date: 04/12/2020  Reason for Consultation/Follow-up: Establishing goals of care  Subjective: Patient is awake alert resting in bed, she is watching TV, today she interacts and verbalizes, denies pain. She states that she wants to get stronger. still confused overall, in no distress.   .     Length of Stay: 11  Current Medications: Scheduled Meds:  . sodium chloride   Intravenous Once  . sodium chloride   Intravenous Once  . Chlorhexidine Gluconate Cloth  6 each Topical Daily  . collagenase   Topical Daily  . feeding supplement  237 mL Oral BID BM  . furosemide  40 mg Intravenous BID  . guaiFENesin  1,200 mg Oral BID  . levofloxacin  750 mg Oral Q48H  . levothyroxine  112 mcg Oral Q0600  . mouth rinse  15 mL Mouth Rinse BID  . potassium chloride  40 mEq Oral BID  . sodium chloride flush  10-40 mL Intracatheter Q12H    Continuous Infusions: . sodium chloride 250 mL (04/13/20 0414)    PRN Meds: acetaminophen, docusate sodium, ipratropium, levalbuterol, ondansetron (ZOFRAN) IV, polyethylene glycol, Resource ThickenUp Clear, sodium chloride flush  Physical Exam    General: Alert, awake, in no acute distress.    Frail and chronically ill appearing.  Regular work of breathing No distress S 1 S 2  Has edema Abdomen not distended.         Vital Signs: BP 129/63 (BP Location: Left Arm)   Pulse 69   Temp 98.1 F (36.7 C) (Oral)   Resp 18   Ht 5\' 5"  (1.651 m)   Wt 50.9 kg   SpO2 95%   BMI 18.67 kg/m  SpO2: SpO2: 95 % O2 Device: O2 Device: Nasal  Cannula O2 Flow Rate: O2 Flow Rate (L/min): 4 L/min  Intake/output summary:   Intake/Output Summary (Last 24 hours) at 04/24/2020 1009 Last data filed at 04/24/2020 0600 Gross per 24 hour  Intake 340 ml  Output 1550 ml  Net -1210 ml   LBM: Last BM Date: 04/22/20 Baseline Weight: Weight: 45.4 kg Most recent weight: Weight:  50.9 kg       Palliative Assessment/Data:      Patient Active Problem List   Diagnosis Date Noted  . Pulmonary infiltrates on CXR 04/19/2020  . Altered mental status   . Thrombocytopenia ([place])   . Dehydration 04/13/2020  . Shock ([place]) 04/13/2020  . Septic shock ([place]) 04/13/2020  . Pressure injury of skin 04/13/2020    Palliative Care Assessment & Plan  Recommendations/Plan: Agree with DNR/DNI Continue current mode of care, recommend ongoing PT follow up while she is in the hospital, recommend out of bed to chair.  SNF rehab with palliative to follow.      Code Status:    Code Status Orders  (From admission, onward)         Start     Ordered   04/16/20 1544  Do not attempt resuscitation (DNR)  Continuous       Question Answer Comment  In the event of cardiac or respiratory ARREST Do not call a "code blue"   In the event of cardiac or respiratory ARREST Do not perform Intubation, CPR, defibrillation or ACLS   In the event of cardiac or respiratory ARREST Use medication by any route, position, wound care, and other measures to relive pain and suffering. May use oxygen, suction and manual treatment of airway obstruction as needed for comfort.      04/16/20 1543        Code Status History    Date Active Date Inactive Code Status Order ID Comments User Context   04/13/2020 0213 04/16/2020 1543 Full Code [id number]  Shellia Cleverly, MD ED   Advance Care Planning Activity      Prognosis:  Guarded  Discharge Planning: To Be Determined SNF rehab with palliative care is being sought.   Care plan was discussed with patient   Thank you  for allowing the Palliative Medicine Team to assist in the care of this patient.   Time In: 9 Time Out: 9.25 Total Time 25 Prolonged Time Billed No   Greater than 50%  of this time was spent counseling and coordinating care related to the above assessment and plan.  Loistine Chance, MD  Please contact Palliative Medicine Team phone at [phone] for questions and concerns.

## 2020-04-24 NOTE — Plan of Care (Signed)

## 2020-04-24 NOTE — Consult Note (Signed)
Reason for Consult:bilateral leg wounds/ mummification Referring Physician: Girguis MD  Michelle Avila is an 74 y.o. female.  HPI: 74 yo female who has been critically ill with septic shock who now has bilateral leg wounds and mummification of the right LE. Both general surgery and vascular surgery consulted with vascular recommending right AKA and wound care for the left leg. I was asked to see the patient by primary team as well.   History reviewed. No pertinent past medical history.  No family history on file.  Social History:  has no history on file for tobacco use, alcohol use, and drug use.  Allergies: Not on File  Medications: I have reviewed the patient's current medications.  Results for orders placed or performed during the hospital encounter of 04/12/20 (from the past 48 hour(s))  Glucose, capillary     Status: Abnormal   Collection Time: 04/22/20  9:19 PM  Result Value Ref Range   Glucose-Capillary 106 (H) 70 - 99 mg/dL    Comment: Glucose reference range applies only to samples taken after fasting for at least 8 hours.  Glucose, capillary     Status: None   Collection Time: 04/22/20 11:59 PM  Result Value Ref Range   Glucose-Capillary 94 70 - 99 mg/dL    Comment: Glucose reference range applies only to samples taken after fasting for at least 8 hours.  CBC with Differential/Platelet     Status: Abnormal   Collection Time: 04/23/20  3:30 AM  Result Value Ref Range   WBC 12.0 (H) 4.0 - 10.5 K/uL   RBC 2.80 (L) 3.87 - 5.11 MIL/uL   Hemoglobin 7.6 (L) 12.0 - 15.0 g/dL   HCT 24.7 (L) 36 - 46 %   MCV 88.2 80.0 - 100.0 fL   MCH 27.1 26.0 - 34.0 pg   MCHC 30.8 30.0 - 36.0 g/dL   RDW 24.1 (H) 11.5 - 15.5 %   Platelets 19 (LL) 150 - 400 K/uL    Comment: REPEATED TO VERIFY Immature Platelet Fraction may be clinically indicated, consider ordering this additional test [id number] CRITICAL VALUE NOTED.  VALUE IS CONSISTENT WITH PREVIOUSLY REPORTED AND CALLED VALUE.     nRBC 0.0 0.0 - 0.2 %   Neutrophils Relative % 89 %   Neutro Abs 10.6 (H) 1.7 - 7.7 K/uL   Lymphocytes Relative 6 %   Lymphs Abs 0.7 0.7 - 4.0 K/uL   Monocytes Relative 4 %   Monocytes Absolute 0.5 0.1 - 1.0 K/uL   Eosinophils Relative 0 %   Eosinophils Absolute 0.0 0.0 - 0.5 K/uL   Basophils Relative 0 %   Basophils Absolute 0.0 0.0 - 0.1 K/uL   Immature Granulocytes 1 %   Abs Immature Granulocytes 0.10 (H) 0.00 - 0.07 K/uL    Comment: Performed at [facility], [place] [address], [place], [place] [address]  Comprehensive metabolic panel     Status: Abnormal   Collection Time: 04/23/20  3:30 AM  Result Value Ref Range   Sodium 147 (H) 135 - 145 mmol/L   Potassium 3.3 (L) 3.5 - 5.1 mmol/L   Chloride 105 98 - 111 mmol/L   CO2 34 (H) 22 - 32 mmol/L   Glucose, Bld 87 70 - 99 mg/dL    Comment: Glucose reference range applies only to samples taken after fasting for at least 8 hours.   BUN 30 (H) 8 - 23 mg/dL   Creatinine, Ser 0.98 0.44 - 1.00 mg/dL   Calcium 7.2 (  L) 8.9 - 10.3 mg/dL   Total Protein 4.8 (L) 6.5 - 8.1 g/dL   Albumin 1.9 (L) 3.5 - 5.0 g/dL   AST 36 15 - 41 U/L   ALT 38 0 - 44 U/L   Alkaline Phosphatase 333 (H) 38 - 126 U/L   Total Bilirubin 0.9 0.3 - 1.2 mg/dL   GFR, Estimated >60 >60 mL/min    Comment: (NOTE) Calculated using the CKD-EPI Creatinine Equation (2021)    Anion gap 8 5 - 15    Comment: Performed at [facility], [place] [address], [place], [place] [address]  Phosphorus     Status: None   Collection Time: 04/23/20  3:30 AM  Result Value Ref Range   Phosphorus 3.3 2.5 - 4.6 mg/dL    Comment: Performed at [facility], [place] [address] [place], [place] [address]  Magnesium     Status: None   Collection Time: 04/23/20  3:30 AM  Result Value Ref Range   Magnesium 1.8 1.7 - 2.4 mg/dL    Comment: Performed at [facility], [place] [address] [place], [place] [address]  Glucose, capillary      Status: None   Collection Time: 04/23/20  3:39 AM  Result Value Ref Range   Glucose-Capillary 81 70 - 99 mg/dL    Comment: Glucose reference range applies only to samples taken after fasting for at least 8 hours.  Glucose, capillary     Status: None   Collection Time: 04/23/20  7:46 AM  Result Value Ref Range   Glucose-Capillary 84 70 - 99 mg/dL    Comment: Glucose reference range applies only to samples taken after fasting for at least 8 hours.  Glucose, capillary     Status: Abnormal   Collection Time: 04/23/20 12:03 PM  Result Value Ref Range   Glucose-Capillary 184 (H) 70 - 99 mg/dL    Comment: Glucose reference range applies only to samples taken after fasting for at least 8 hours.  Glucose, capillary     Status: Abnormal   Collection Time: 04/23/20  4:50 PM  Result Value Ref Range   Glucose-Capillary 165 (H) 70 - 99 mg/dL    Comment: Glucose reference range applies only to samples taken after fasting for at least 8 hours.  Glucose, capillary     Status: None   Collection Time: 04/23/20  7:53 PM  Result Value Ref Range   Glucose-Capillary 86 70 - 99 mg/dL    Comment: Glucose reference range applies only to samples taken after fasting for at least 8 hours.  Glucose, capillary     Status: Abnormal   Collection Time: 04/23/20 11:41 PM  Result Value Ref Range   Glucose-Capillary 102 (H) 70 - 99 mg/dL    Comment: Glucose reference range applies only to samples taken after fasting for at least 8 hours.  CBC with Differential/Platelet     Status: Abnormal   Collection Time: 04/24/20  4:02 AM  Result Value Ref Range   WBC 11.2 (H) 4.0 - 10.5 K/uL   RBC 2.76 (L) 3.87 - 5.11 MIL/uL   Hemoglobin 7.8 (L) 12.0 - 15.0 g/dL   HCT 24.8 (L) 36 - 46 %   MCV 89.9 80.0 - 100.0 fL   MCH 28.3 26.0 - 34.0 pg   MCHC 31.5 30.0 - 36.0 g/dL   RDW 25.0 (H) 11.5 - 15.5 %   Platelets 19 (LL) 150 - 400 K/uL    Comment: REPEATED TO VERIFY  Immature Platelet Fraction may be clinically indicated,  consider ordering this additional test [id number] CRITICAL VALUE NOTED.  VALUE IS CONSISTENT WITH PREVIOUSLY REPORTED AND CALLED VALUE.    nRBC 0.0 0.0 - 0.2 %   Neutrophils Relative % 85 %   Neutro Abs 9.6 (H) 1.7 - 7.7 K/uL   Lymphocytes Relative 8 %   Lymphs Abs 0.9 0.7 - 4.0 K/uL   Monocytes Relative 5 %   Monocytes Absolute 0.6 0.1 - 1.0 K/uL   Eosinophils Relative 1 %   Eosinophils Absolute 0.1 0.0 - 0.5 K/uL   Basophils Relative 0 %   Basophils Absolute 0.0 0.0 - 0.1 K/uL   Immature Granulocytes 1 %   Abs Immature Granulocytes 0.10 (H) 0.00 - 0.07 K/uL    Comment: Performed at [facility], [place] [address] [place], [place] [address]  Comprehensive metabolic panel     Status: Abnormal   Collection Time: 04/24/20  4:02 AM  Result Value Ref Range   Sodium 148 (H) 135 - 145 mmol/L   Potassium 4.5 3.5 - 5.1 mmol/L    Comment: DELTA CHECK NOTED NO VISIBLE HEMOLYSIS    Chloride 104 98 - 111 mmol/L   CO2 35 (H) 22 - 32 mmol/L   Glucose, Bld 83 70 - 99 mg/dL    Comment: Glucose reference range applies only to samples taken after fasting for at least 8 hours.   BUN 22 8 - 23 mg/dL   Creatinine, Ser 0.90 0.44 - 1.00 mg/dL   Calcium 7.5 (L) 8.9 - 10.3 mg/dL   Total Protein 4.9 (L) 6.5 - 8.1 g/dL   Albumin 1.8 (L) 3.5 - 5.0 g/dL   AST 52 (H) 15 - 41 U/L   ALT 40 0 - 44 U/L   Alkaline Phosphatase 371 (H) 38 - 126 U/L   Total Bilirubin 1.0 0.3 - 1.2 mg/dL   GFR, Estimated >60 >60 mL/min    Comment: (NOTE) Calculated using the CKD-EPI Creatinine Equation (2021)    Anion gap 9 5 - 15    Comment: Performed at [facility], [place] [address] [place], [place] [address]  Magnesium     Status: Abnormal   Collection Time: 04/24/20  4:02 AM  Result Value Ref Range   Magnesium 1.6 (L) 1.7 - 2.4 mg/dL    Comment: Performed at [facility], [place] [address], [place], [place] [address]  Phosphorus     Status: None   Collection Time:  04/24/20  4:02 AM  Result Value Ref Range   Phosphorus 3.0 2.5 - 4.6 mg/dL    Comment: Performed at [facility], [place] [address] [place], [place] [address]  Glucose, capillary     Status: None   Collection Time: 04/24/20  5:01 AM  Result Value Ref Range   Glucose-Capillary 84 70 - 99 mg/dL    Comment: Glucose reference range applies only to samples taken after fasting for at least 8 hours.  Glucose, capillary     Status: None   Collection Time: 04/24/20  8:03 AM  Result Value Ref Range   Glucose-Capillary 85 70 - 99 mg/dL    Comment: Glucose reference range applies only to samples taken after fasting for at least 8 hours.  Prepare Pheresed Platelets     Status: None (Preliminary result)   Collection Time: 04/24/20  8:12 AM  Result Value Ref Range   Unit Number [id number]    Blood Component Type PLTPHER LR1  Unit division 00    Status of Unit ISSUED    Transfusion Status      OK TO TRANSFUSE Performed at [place] [address], [place], [place] [address]   Type and screen [place]     Status: None   Collection Time: 04/24/20 10:05 AM  Result Value Ref Range   ABO/RH(D) O POS    Antibody Screen NEG    Sample Expiration      04/27/2020,2359 Performed at [facility], [place] [address] [place], [place] [address]   Glucose, capillary     Status: None   Collection Time: 04/24/20 12:16 PM  Result Value Ref Range   Glucose-Capillary 90 70 - 99 mg/dL    Comment: Glucose reference range applies only to samples taken after fasting for at least 8 hours.  Glucose, capillary     Status: Abnormal   Collection Time: 04/24/20  5:30 PM  Result Value Ref Range   Glucose-Capillary 58 (L) 70 - 99 mg/dL    Comment: Glucose reference range applies only to samples taken after fasting for at least 8 hours.   Comment 1 Notify RN    Comment 2 Document in Chart   Glucose, capillary     Status: Abnormal    Collection Time: 04/24/20  6:40 PM  Result Value Ref Range   Glucose-Capillary 67 (L) 70 - 99 mg/dL    Comment: Glucose reference range applies only to samples taken after fasting for at least 8 hours.   Comment 1 Notify RN    Comment 2 Document in Chart     DG CHEST PORT 1 VIEW  Result Date: 04/23/2020 CLINICAL DATA:  Shortness of breath EXAM: PORTABLE CHEST 1 VIEW COMPARISON:  Yesterday FINDINGS: PICC on the right with tip at the upper cavoatrial junction. Hazy opacification of the left more than right chest from pleural fluid and presumed atelectasis. Left-sided effusion appears moderate or large. No visible pneumothorax. Normal heart size. Granulomatous nodal calcifications. IMPRESSION: Pleural effusions with opacified or obscured lungs. The left effusion appears moderate or large. Electronically Signed   By: Monte Fantasia M.D.   On: 04/23/2020 08:03    Review of Systems Blood pressure 138/72, pulse 90, temperature 98.2 F (36.8 C), temperature source Oral, resp. rate 20, height 5\' 5"  (1.651 m), weight 50.9 kg, SpO2 94 %. Physical Exam Patient is cachetic appearing but awake and alert and responsive. Bilateral UEs with pain free AROM. Left leg with petechia located below the knee anteriorly and posteriorly with a superficial ulcer on the posterior lateral lower leg. Compartments are supple with no fluctuance. Patient able to actively dorsiflex and plantarflex her ankle and can wiggle her toes which are reasonably perfused  The right leg has mummification of the foot and ankle with open sores on the plantar foot and leg below the knee. Her skin seems reasonable and intact above the knee although I cannot palpate a popliteal pulse. No fluctuance and minimal edema above the knee.   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 I concur with recommendation of Vascular Surgeons that she will require AKA and care at [facility]. 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  I also agree that with meticulous wound care/wound care consult, that the left leg can be saved. Thrombocytopenia and subcutaneous bleeding is complicating issues. No evidence for abscess or osteo  Please let ortho know if we can be of further help.  Augustin Schooling 04/24/2020, 6:52 PM

## 2020-04-25 ENCOUNTER — Other Ambulatory Visit: Payer: Self-pay

## 2020-04-25 ENCOUNTER — Encounter (HOSPITAL_COMMUNITY): Payer: Self-pay | Admitting: Pulmonary Disease

## 2020-04-25 DIAGNOSIS — A419 Sepsis, unspecified organism: Secondary | ICD-10-CM | POA: Diagnosis not present

## 2020-04-25 DIAGNOSIS — R6521 Severe sepsis with septic shock: Secondary | ICD-10-CM | POA: Diagnosis not present

## 2020-04-25 LAB — GLUCOSE, CAPILLARY
Glucose-Capillary: 37 mg/dL — CL (ref 70–99)
Glucose-Capillary: 160 mg/dL — ABNORMAL HIGH (ref 70–99)
Glucose-Capillary: 98 mg/dL (ref 70–99)
Glucose-Capillary: 91 mg/dL (ref 70–99)
Glucose-Capillary: 83 mg/dL (ref 70–99)
Glucose-Capillary: 30 mg/dL — CL (ref 70–99)
Glucose-Capillary: 49 mg/dL — ABNORMAL LOW (ref 70–99)
Glucose-Capillary: 25 mg/dL — CL (ref 70–99)
Glucose-Capillary: 277 mg/dL — ABNORMAL HIGH (ref 70–99)
Glucose-Capillary: 91 mg/dL (ref 70–99)

## 2020-04-25 LAB — COMPREHENSIVE METABOLIC PANEL
ALT: 38 U/L (ref 0–44)
AST: 37 U/L (ref 15–41)
Albumin: 1.9 g/dL — ABNORMAL LOW (ref 3.5–5.0)
Alkaline Phosphatase: 329 U/L — ABNORMAL HIGH (ref 38–126)
Anion gap: 11 (ref 5–15)
BUN: 21 mg/dL (ref 8–23)
CO2: 33 mmol/L — ABNORMAL HIGH (ref 22–32)
Calcium: 7.3 mg/dL — ABNORMAL LOW (ref 8.9–10.3)
Chloride: 99 mmol/L (ref 98–111)
Creatinine, Ser: 0.92 mg/dL (ref 0.44–1.00)
GFR, Estimated: >60 mL/min (ref >60)
Glucose, Bld: 81 mg/dL (ref 70–99)
Potassium: 4.1 mmol/L (ref 3.5–5.1)
Sodium: 143 mmol/L (ref 135–145)
Total Bilirubin: 0.8 mg/dL (ref 0.3–1.2)
Total Protein: 5 g/dL — ABNORMAL LOW (ref 6.5–8.1)

## 2020-04-25 LAB — BPAM PLATELET PHERESIS
Blood Product Expiration Date: 202110282359
ISSUE DATE / TIME: 202110281101
Unit Type and Rh: 7300

## 2020-04-25 LAB — PREPARE PLATELET PHERESIS: Unit division: 0

## 2020-04-25 LAB — CBC WITH DIFFERENTIAL/PLATELET
Abs Immature Granulocytes: 0.07 10*3/uL (ref 0.00–0.07)
Basophils Absolute: 0 10*3/uL (ref 0.0–0.1)
Basophils Relative: 0 %
Eosinophils Absolute: 0 10*3/uL (ref 0.0–0.5)
Eosinophils Relative: 0 %
HCT: 24.6 % — ABNORMAL LOW (ref 36.0–46.0)
Hemoglobin: 7.5 g/dL — ABNORMAL LOW (ref 12.0–15.0)
Immature Granulocytes: 1 %
Lymphocytes Relative: 8 %
Lymphs Abs: 0.8 10*3/uL (ref 0.7–4.0)
MCH: 27.4 pg (ref 26.0–34.0)
MCHC: 30.5 g/dL (ref 30.0–36.0)
MCV: 89.8 fL (ref 80.0–100.0)
Monocytes Absolute: 0.6 10*3/uL (ref 0.1–1.0)
Monocytes Relative: 6 %
Neutro Abs: 8.6 10*3/uL — ABNORMAL HIGH (ref 1.7–7.7)
Neutrophils Relative %: 85 %
Platelets: 49 10*3/uL — ABNORMAL LOW (ref 150–400)
RBC: 2.74 MIL/uL — ABNORMAL LOW (ref 3.87–5.11)
RDW: 25.2 % — ABNORMAL HIGH (ref 11.5–15.5)
WBC: 10.2 10*3/uL (ref 4.0–10.5)
nRBC: 0 % (ref 0.0–0.2)

## 2020-04-25 LAB — MAGNESIUM: Magnesium: 1.4 mg/dL — ABNORMAL LOW (ref 1.7–2.4)

## 2020-04-25 LAB — PHOSPHORUS: Phosphorus: 3.3 mg/dL (ref 2.5–4.6)

## 2020-04-25 MED ORDER — DEXTROSE 50 % IV SOLN: 25.0000 mL | Freq: Once | INTRAVENOUS | Status: DC

## 2020-04-25 MED ORDER — DEXTROSE 50 % IV SOLN
25.0000 mL | Freq: Once | INTRAVENOUS | Status: AC
  Administered 2020-04-25: 25 mL via INTRAVENOUS

## 2020-04-25 MED ORDER — DEXTROSE 50 % IV SOLN
INTRAVENOUS | Status: AC
  Administered 2020-04-25: 25 g via INTRAVENOUS
  Filled 2020-04-25: qty 50

## 2020-04-25 MED ORDER — MAGNESIUM OXIDE 400 (241.3 MG) MG PO TABS
800.0000 mg | ORAL_TABLET | Freq: Once | ORAL | Status: AC
  Administered 2020-04-25: 800 mg via ORAL
  Filled 2020-04-25: qty 2

## 2020-04-25 MED ORDER — DEXTROSE 50 % IV SOLN: 25.0000 g | INTRAVENOUS | Status: AC

## 2020-04-25 MED ORDER — DEXTROSE 50 % IV SOLN
INTRAVENOUS | Status: AC
  Administered 2020-04-25: 50 mL
  Filled 2020-04-25: qty 50

## 2020-04-25 MED ORDER — DEXTROSE 10 % IV SOLN: INTRAVENOUS | Status: DC

## 2020-04-25 MED ORDER — ADULT MULTIVITAMIN W/MINERALS CH
1.0000 | ORAL_TABLET | Freq: Every day | ORAL | Status: DC
  Administered 2020-04-25 – 2020-05-02 (×7): 1 via ORAL
  Filled 2020-04-25 (×7): qty 1

## 2020-04-25 MED ORDER — GLUCAGON HCL RDNA (DIAGNOSTIC) 1 MG IJ SOLR: 1.0000 mg | Freq: Once | INTRAMUSCULAR | Status: DC | PRN

## 2020-04-25 NOTE — Progress Notes (Signed)
Report called to [place] RN on 5N, patient to be transferred to room 23C, daughter at bedside and aware, personal belonging with patient's daughter with exception of glasses, [facility] contacted and will transport

## 2020-04-25 NOTE — Significant Event (Signed)
Rapid Response Event Note   Reason for Call :  Called d/t ongoing hypoglycemia despite treatment with juice and D50 x2  Initial Focused Assessment:  Pt laying in bed with eyes open, alert and oriented, in no distress. Pt denies lightheadedness/dizziness/shakiness/pain/SOB. Fingertips are dusky in color. CBG drawn from R PICC line resulting in 227. Fingerstick done at the same time resulting in 91.    Interventions:  CBG drawn from PICC and correlated to fingerstick (227 vs 91)  Plan of Care:  Fingerstick CBGs do not appear to be accurate. Please draw CBGs from PICC line at this time. IV Team aware of need for PICC line blood draw q4h. Please call RRT if further assistance needed.    Event Summary:   MD Notified: 5N RN to notify MD of happenings. Call Time:1933 Arrival [id number] End [id number]  Dillard Essex, RN

## 2020-04-25 NOTE — Progress Notes (Signed)
Nutrition Follow-up  DOCUMENTATION CODES:   Severe malnutrition in context of chronic illness  INTERVENTION:  - continue Ensure Enlive BID and Magic Cup BID.  - recommend trial of appetite stimulant - if PO intakes remain poor, especially if patient opts to pursue AKA, recommend small bore NGT placement and initiation of TF.   NUTRITION DIAGNOSIS:   Severe Malnutrition related to chronic illness as evidenced by moderate fat depletion, moderate muscle depletion, severe fat depletion, severe muscle depletion. -revised  GOAL:   Patient will meet greater than or equal to 90% of their needs -unmet  MONITOR:   PO intake, Supplement acceptance, Labs, Weight trends, I & O's, Skin  ASSESSMENT:   74 year old chronically ill-appearing cachectic Caucasian female with a past medical history significant for hypothyroidism who presented with altered mental status, hypoglycemia, dehydration.  From report neighbors had not seen or heard from her in several days and daughter came in from out of state and found the patient on the floor in feces and was very altered.  Patient has been eating 0-20% of meals since 10/24. She is noted to be a/o to self and place. Patient is able to carry a conversation with RD and is aware of what is occurring regarding her RLE, but otherwise not able to provide much relevant information; she enjoyed talking about a close friend of hers and a trip she took to visit her several years ago.   Patient's daughter at bedside and states that for the first 5 days of admission patient was NPO. Since diet advancement, appetite and intakes have been poor but have been improving since Tuesday or Wednesday of this week.  Her daughter bought lunch from patient from a restaurant because patient is hungry today and interested in eating and daughter wanted to ensure that she had something she would enjoy. Fried shrimp brought in is not in line with current diet order of dysphagia 2.    Daughter asks about any foods that should be avoided. Informed her  foods/nutrients are off limits or need to be avoided, but the biggest thing is making sure that foods are in line with current diet order and that patient is thoroughly chewing items before swallowing. Re-iterated the importance of appropriate textures of foods and need for patient to thoroughly chew. Informed RN of lunch brought in and that patient's daughter is at bedside and watching patient closely during the meal.   Pending decision by patient/family pertaining to moving forward with R AKA.    Labs reviewed; CBGs: 37, 160, 98 mg/dl, Ca: 7.3 mg/dl, Mg: 1.4 mg/dl, Alk Phos elevated.  Medications reviewed; 40 mg IV lasix BID, 112 mcg synthroid/day, 800 mg mag-ox x1 dose 10/28 and x1 dose 10/29, 40 mEq KCl BID.    NUTRITION - FOCUSED PHYSICAL EXAM:    Most Recent Value  Orbital Region Moderate depletion  Upper Arm Region Severe depletion  Thoracic and Lumbar Region Severe depletion  Buccal Region Moderate depletion  Temple Region Severe depletion  Clavicle Bone Region Severe depletion  Clavicle and Acromion Bone Region Severe depletion  Scapular Bone Region Unable to assess  Dorsal Hand Severe depletion  Patellar Region Severe depletion  Anterior Thigh Region Severe depletion  Posterior Calf Region Severe depletion  Edema (RD Assessment) None  Hair Reviewed  Eyes Reviewed  Mouth Reviewed  Skin Reviewed  Nails Reviewed       Diet Order:   Diet Order            DIET DYS  2 Room service appropriate? Yes with Assist; Fluid consistency: Thin  Diet effective now                 EDUCATION NEEDS:   No education needs have been identified at this time  Skin:  Skin Assessment: Skin Integrity Issues: Skin Integrity Issues:: DTI, Stage III, Other (Comment) DTI: bilateral heels; left knee Stage III: sacrum Other: non-pressure wound on right thigh  Last BM:  10/28 (type 6)  Height:   Ht Readings from  Last 1 Encounters:  04/13/20 5' 5"  (1.651 m)    Weight:   Wt Readings from Last 1 Encounters:  04/23/20 50.9 kg     Estimated Nutritional Needs:  Kcal:  1550-1750 Protein:  70-85g Fluid:  1.8L/day     Jarome Matin, MS, RD, LDN, CNSC Inpatient Clinical Dietitian RD pager # available in [facility]  After hours/weekend pager # available in [facility]

## 2020-04-25 NOTE — Progress Notes (Signed)
PROGRESS NOTE    Michelle Avila   JWJ:[id number]  DOB: [birth year]  DOA: 04/12/2020     12  PCP: Street, Sharon Mt, MD  CC: AMS  Hospital Course: The patient is a 74 year old chronically ill-appearing cachectic Caucasian female with a past medical history significant for hypothyroidism who presented with altered mental status, hypoglycemia, dehydration.  From report neighbors had not seen or heard from her in several days and daughter came in from out of state and found the patient on the floor in feces and was very altered.  EMS was dispatched and upon evaluation her blood sugar was 19 and so they gave her D10.  She was brought to the ED and found to be dehydrated and given 2-1/2 L of IV fluids.  CT head was obtained without acute changes.  Blood and urine cultures were ordered but the ED felt the picture was more consistent with dehydration and hypoglycemia rather than infection so she was not started on antibiotics at that time.  Her mentation largely improved in the ED and she endorsed a recent med change and was placed on a diuretic for her bilateral lower extremity edema.    Further work-up revealed that she ended up going into septic shock and hypovolemic shock from a gram-negative bacteremia likely from being in her feces and possible colitis.  She was started on IV Zosyn briefly then changed to Levaquin and then to ciprofloxacin.  She was placed on pressors and had encephalopathy and electrolyte derangements.  They were adjusted and corrected and she was given albumin and stabilized in the ICU.  She had some dysphagia so she was n.p.o. and SLP evaluated and they are now recommending a dysphagia 2 diet with nectar thick liquids.   She is transferred to [facility] service 04/18/2020.  During her hospitalization she had critical illness thrombocytopenia and anemia. She gets transfusions as needed while bone marrow recovers.    04/19/20: Palliative care was consulted and will see the  patient today.  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  Platelet counts continue to drop and will be transfused 2 more units of platelets and I discussed the case with Dr. Alen Blew who feels that this is secondary to her septic shock and her bacteremia and recommend supportive care at this time and transfusions.  Continue diuresis as tolerated with lasix.   04/20/20: Blood count has dropped so we will transfuse 1 unit of PRBCs.  WBC is trending upwards.  Potassium is on the lower side.  Renal function is relatively stable.  Palliative care to have a goals of care discussion.  Continued on Levaquin.   04/21/2020: She remains significantly volume overloaded still is still positive over 13L.  Continue IV diuresis.  Platelet count dropped again to 18  so getting 1 unit. Electrolytes are abnormal and will be repeated in the setting of her diuresis.  Pulmonary signed off the case has no further recommendations   04/22/2020: Patient remains agitated but she is getting diuresed and her renal function is improved and she is 12 L positive now.  PT OT recommending SNF.  Platelet count is still on the lower side but no active bleeding so we will continue monitor and repeat in the morning.    Due to her ongoing right foot wound/mummification, a consult with ortho and vascular surgery was pursued.  She will likely need a right AKA and intense wound care with her LLE.  She  will be transferred to [facility] for further vascular surgery evaluation and ongoing wound care to LLE. Ultimately she will discharge to SNF after hospitalization. Her daughter from [place] is very involved and long term plan is for patient to move to [place] with daughter once discharged from rehab.    Interval History:  Patient had multiple evaluations yesterday. This morning her daughter is bedside and we discussed the recommendations by vascular surgery. Overall they understand the high risk with  both surgery and nonsurgery. At this point, her daughter wishes for further surgical evaluation and attempt for pursuing AKA if recommended; patient also in agreement this morning. They understand plan will be for transfer to [facility] for vascular surgery to continue evaluation.  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 with likely possible concomitant pneumonia -In the ED she is found to be dehydrated and given 2.5 L of IV fluids -Daughter found the patient on the floor in feces and very altered and patient initially had a blood glucose of 19 -Urine culture showing less than 10,000 colony-forming units of insignificant growth - changed IM ceftriaxone to IV levofloxacin for now; IV Zosyn has now been stopped -WBC, PCT, and lactic all improved  -She is now off of pressors -Changed ceftriaxone to IV Levofloxacin for 14 Days: Course will be considered completed on 04/26/20 -PT OT evaluation done and recommending skilled nursing facility post acute hospitalization discharge  Right foot wound - right foot noted to be essentially mummified; it is cold with dried skin with cadaver like appearance that is unsalvagable; right mid leg weeping foul smelling drainage thru bandage - unclear if any intervention would be feasible or beneficial at this time other than preventing further ascending infection - will ask for vascular input; seems to be a poor surgical candidate given co-morbidities and thrombocytopenia; greatly appreciate evaluation. For now we will proceed with further evaluation with vascular surgery; patient and daughter are amenable to potential AKA and understand risk of surgery vs non-surgery but seem to want  to "give her the chance".  - patient may need serial transfusions prior to surgery to maximize PLTC and Hgb  Left leg wounds -Patient also has extensive excoriations and wounds noted on left lower extremity.  Pulses are adequate and per surgery, does have a chance of healing -Needs ongoing wound care consult upon transfer  Acute Encephalopathy, waxing and waning -Likely multifactorial including her severe hypoglycemia on admission, dehydration, sepsis from her colitis and bacteremia -Initial head and repeat CT done had no acute changes  -She had ICU delirium and was hyperactive and hypoactive but the critical care physician feels that the delirium may be a sign that she may be actively dying (for now seems to have improved) -Delirium precautions initiated  Volume overload in the setting of volume resuscitation as well as acute on chronic diastolic CHF -In the setting of her poor nutritional status and possible liver dysfunction -Echocardiogram showed a normal EF but grade 2 diastolic dysfunction -BNP was elevated at 1,113.0 and improved to 437.2 -Strict I's and O's and daily weights - diuresing well on lasix -Pulmonary had no further recommendations and has signed off  Acute Respiratory Failure with Hypoxia, stable -Continue Levaquin and diuresis -Chest x-ray done and showed "Extensive airspace disease with asymmetry favoring infection  over edema. There are pleural effusions, greater on the left, and there may be superimposed failure." -Chest x-ray 04/21/2020 showed "Stable bilateral pulmonary edema and pleural effusions." -Continue with IV Levaquin for infection -continue pulmonary toileting, guaifenesin 1200 mg p.o. twice daily, flutter valve, incentive spirometer, Xopenex and Atrovent breathing treatments -continue O2, remains on 4L -Continue chest PT and flutter valve and incentive spirometry -Appreciate pulmonary recommendations and now they have signed off the  case  Dysphagia -Patient has moderately severe oral pharyngeal dysphagia; today SLP evaluated and recommended n.p.o. ice chips as needed after oral care -MBS done and she had overt aspiration of thin liquids and speech therapy recommending dysphagia 2 nectar thick diet with allowing teaspoon of thins  - other considerations prior to surgery could be TF via NGT to promote wound healing, etc (appreciate RD assistance)  AKI -Have an Unknown Baseline; initially CKD suspected but renal function appears to have normalized  - now s/p IVF followed by diuresis for volume overload - continue diuresis; will back off if renal function bumps  Lower extremity bilateral edema - resolved -Echocardiogram done and showed grade 2 diastolic dysfunction with a normal EF -greatly improved - continue lasix as noted above   Severe Protein Calorie Malnutrition/Failure to Thrive -Now placed on a dysphagia 2 diet  Hyperbilirubinemia - resolved  -Liver Doppler done and as below -Continue monitor and trend and repeat CMP in the a.m.   Hypothyroidism -Patient's TSH was 9.921 and free T4 0.67 -Her levothyroxine has been resumed at 112 mcg   Abnormal LFTs, improving -Still somewhat elevated but trending down from earlier on admission -LFTs have now normalized -Liver Doppler done on 10/18 showed "Widely patent hepatic vasculature with normal directional flow. Nonspecific atrophy of the left lobe of the liver as demonstrated on preceding abdominal CT. No discrete hepatic lesions. Trace amount of perihepatic ascites and small/trace bilateral effusions as was demonstrated on preceding abdominal CT. Cholelithiasis without evidence of cholecystitis." -Continue monitor and trend and repeat CMP in a.m.  Thrombocytopenia -Patient's Platelet Count dropped to 5 and now slowly trending up but still labile  -In the Setting of Septic Shock and her gram-negative bacteremia and I spoke with oncology who feels that this  is secondary to her infection and not related to HUS TTP or ITP -s/p 5 units PLT for admission - PLTC still 19k on 10/28, will transfuse 1 more unit  - continue holding chemical DVT ppx  Hypokalemia - replete and recheck as needed  Normocytic anemia/anemia of critical illness - baseline roughtly 8-9 - continue trending; transfuse as needed   Hyponatremia -Mild -Repeat BMP in a.m.   DVT prophylaxis: SCD Code Status: DNR Family Communication: daughter bedside  Disposition Plan: Status is: Inpatient  Remains inpatient appropriate because:Unsafe d/c plan, IV treatments appropriate due to intensity of illness or inability to take PO and Inpatient level of care appropriate due to severity of illness   Dispo: The patient is from: Home              Anticipated d/c is to: SNF              Anticipated d/c date is: > 3 days              Patient currently is not medically stable to d/c.  Objective: Blood pressure (!) 151/67, pulse 74, temperature 98.1 F (36.7 C), temperature source Oral, resp. rate 20, height 5\' 5"  (1.651 m), weight 50.9 kg, SpO2 96 %.  Examination: General appearance:  more awake/alert this am, resting in bed in NAD. Daughter bedside Head: Normocephalic, without obvious abnormality, atraumatic Eyes: EOMI Lungs: clear to auscultation bilaterally Heart: regular rate and rhythm and S1, S2 normal Abdomen: normal findings: bowel sounds normal and soft, non-tender Extremities: Right foot is shriveled, cold to the touch, hardened and she is unable to move it and there is no sensation; for all practical purposes this foot is unsalvagable and "dead". Foul smelling drainage weeping thru bandage up to mid leg on right foot as well Skin: Diffuse scattered bruising throughout Neurologic: Moves all 4 extremities.  Confusion noted at baseline but today more alert  Consultants:   PCCM  Palliative care medicine  Vascular surgery   Procedures:     Data Reviewed: I have  personally reviewed following labs and imaging studies Results for orders placed or performed during the hospital encounter of 04/12/20 (from the past 24 hour(s))  Glucose, capillary     Status: Abnormal   Collection Time: 04/24/20  5:30 PM  Result Value Ref Range   Glucose-Capillary 58 (L) 70 - 99 mg/dL   Comment 1 Notify RN    Comment 2 Document in Chart   Glucose, capillary     Status: Abnormal   Collection Time: 04/24/20  6:40 PM  Result Value Ref Range   Glucose-Capillary 67 (L) 70 - 99 mg/dL   Comment 1 Notify RN    Comment 2 Document in Chart   Glucose, capillary     Status: Abnormal   Collection Time: 04/24/20  7:46 PM  Result Value Ref Range   Glucose-Capillary 57 (L) 70 - 99 mg/dL  Glucose, capillary     Status: None   Collection Time: 04/24/20  9:04 PM  Result Value Ref Range   Glucose-Capillary 87 70 - 99 mg/dL  Glucose, capillary     Status: Abnormal   Collection Time: 04/24/20 11:43 PM  Result Value Ref Range   Glucose-Capillary 122 (H) 70 - 99 mg/dL  Glucose, capillary     Status: Abnormal   Collection Time: 04/25/20  4:13 AM  Result Value Ref Range   Glucose-Capillary 37 (LL) 70 - 99 mg/dL   Comment 1 Notify RN   CBC with Differential/Platelet     Status: Abnormal   Collection Time: 04/25/20  4:15 AM  Result Value Ref Range   WBC 10.2 4.0 - 10.5 K/uL   RBC 2.74 (L) 3.87 - 5.11 MIL/uL   Hemoglobin 7.5 (L) 12.0 - 15.0 g/dL   HCT 24.6 (L) 36 - 46 %   MCV 89.8 80.0 - 100.0 fL   MCH 27.4 26.0 - 34.0 pg   MCHC 30.5 30.0 - 36.0 g/dL   RDW 25.2 (H) 11.5 - 15.5 %   Platelets 49 (L) 150 - 400 K/uL   nRBC 0.0 0.0 - 0.2 %   Neutrophils Relative % 85 %   Neutro Abs 8.6 (H) 1.7 - 7.7 K/uL   Lymphocytes Relative 8 %   Lymphs Abs 0.8 0.7 - 4.0 K/uL   Monocytes Relative 6 %   Monocytes Absolute 0.6 0.1 - 1.0 K/uL   Eosinophils Relative 0 %   Eosinophils Absolute 0.0 0.0 - 0.5 K/uL   Basophils Relative 0 %   Basophils Absolute 0.0 0.0 - 0.1 K/uL   Immature  Granulocytes 1 %   Abs Immature Granulocytes 0.07 0.00 - 0.07 K/uL  Comprehensive metabolic panel     Status: Abnormal   Collection Time: 04/25/20  4:15 AM  Result Value  Ref Range   Sodium 143 135 - 145 mmol/L   Potassium 4.1 3.5 - 5.1 mmol/L   Chloride 99 98 - 111 mmol/L   CO2 33 (H) 22 - 32 mmol/L   Glucose, Bld 81 70 - 99 mg/dL   BUN 21 8 - 23 mg/dL   Creatinine, Ser 0.92 0.44 - 1.00 mg/dL   Calcium 7.3 (L) 8.9 - 10.3 mg/dL   Total Protein 5.0 (L) 6.5 - 8.1 g/dL   Albumin 1.9 (L) 3.5 - 5.0 g/dL   AST 37 15 - 41 U/L   ALT 38 0 - 44 U/L   Alkaline Phosphatase 329 (H) 38 - 126 U/L   Total Bilirubin 0.8 0.3 - 1.2 mg/dL   GFR, Estimated >60 >60 mL/min   Anion gap 11 5 - 15  Magnesium     Status: Abnormal   Collection Time: 04/25/20  4:15 AM  Result Value Ref Range   Magnesium 1.4 (L) 1.7 - 2.4 mg/dL  Phosphorus     Status: None   Collection Time: 04/25/20  4:15 AM  Result Value Ref Range   Phosphorus 3.3 2.5 - 4.6 mg/dL  Glucose, capillary     Status: Abnormal   Collection Time: 04/25/20  4:57 AM  Result Value Ref Range   Glucose-Capillary 160 (H) 70 - 99 mg/dL  Glucose, capillary     Status: None   Collection Time: 04/25/20  7:26 AM  Result Value Ref Range   Glucose-Capillary 98 70 - 99 mg/dL  Glucose, capillary     Status: None   Collection Time: 04/25/20 12:18 PM  Result Value Ref Range   Glucose-Capillary 91 70 - 99 mg/dL    No results found for this or any previous visit (from the past 240 hour(s)).   Radiology Studies: No results found. DG CHEST PORT 1 VIEW  Final Result    DG CHEST PORT 1 VIEW  Final Result    DG CHEST PORT 1 VIEW  Final Result    DG CHEST PORT 1 VIEW  Final Result    DG CHEST PORT 1 VIEW  Final Result    DG Swallowing Func-Speech Pathology  Final Result    CT HEAD WO CONTRAST  Final Result    [place] EKG SITE RITE  Final Result    VAS [place] LOWER EXTREMITY VENOUS (DVT)  Final Result    US LIVER DOPPLER  Final Result    CT  ABDOMEN PELVIS WO CONTRAST  Final Result    CT TIBIA FIBULA LEFT WO CONTRAST  Final Result    DG Chest Port 1 View  Final Result    CT Head Wo Contrast  Final Result      Scheduled Meds: . Chlorhexidine Gluconate Cloth  6 each Topical Daily  . collagenase   Topical Daily  . feeding supplement  237 mL Oral BID BM  . furosemide  40 mg Intravenous BID  . guaiFENesin  1,200 mg Oral BID  . levofloxacin  750 mg Oral Q48H  . levothyroxine  112 mcg Oral Q0600  . mouth rinse  15 mL Mouth Rinse BID  . multivitamin with minerals  1 tablet Oral Daily  . potassium chloride  40 mEq Oral BID  . sodium chloride flush  10-40 mL Intracatheter Q12H   PRN Meds: acetaminophen, docusate sodium, ipratropium, levalbuterol, ondansetron (ZOFRAN) IV, polyethylene glycol, Resource ThickenUp Clear, sodium chloride flush Continuous Infusions: . sodium chloride 250 mL (04/24/20 1835)     LOS: 12 days  Time spent: Greater than 50% of the 35 minute visit was spent in counseling/coordination of care for the patient as laid out in the A&P.   Dwyane Dee, MD [facility] 04/25/2020, 1:37 PM

## 2020-04-25 NOTE — Progress Notes (Signed)
Hypoglycemic Event  CBG: 49  Treatment: juice, then 25mg   d50, then another 25mg  d50,  Symptoms: asymptomatic  Follow-up CBG: Time:1905 CBG Result:30 Follow-up CBG: Time:1925 CBG Result:27 Follow-up CBG: Time:1936 CBG Result:25 Follow-up CBG: MKJI:[id number] CBG Result:227 (via PICC from RRN)  Possible Reasons for Event:pt did not eat supper   Comments/MD notified:MD notified, rapid called, awaiting new orders   RRN drew sample from PICC, cbg read 227, finger stick read 91, finger stick does not correlate with central sample.    [place]

## 2020-04-25 NOTE — Plan of Care (Signed)

## 2020-04-25 NOTE — Progress Notes (Signed)
Hypoglycemic Event  CBG: 37  Treatment: D50 given.  Symptoms:  sweaty but alert confused but baseline. Did not want orange juice.   Follow-up CBG: Time 0430 CBG Result:160  Possible Reasons for Event: Poor appetite  Comments/MD notified:Dr Wyn Forster

## 2020-04-26 DIAGNOSIS — I10 Essential (primary) hypertension: Secondary | ICD-10-CM | POA: Diagnosis not present

## 2020-04-26 DIAGNOSIS — R6521 Severe sepsis with septic shock: Secondary | ICD-10-CM | POA: Diagnosis not present

## 2020-04-26 DIAGNOSIS — D509 Iron deficiency anemia, unspecified: Secondary | ICD-10-CM | POA: Diagnosis not present

## 2020-04-26 DIAGNOSIS — E7849 Other hyperlipidemia: Secondary | ICD-10-CM | POA: Diagnosis not present

## 2020-04-26 DIAGNOSIS — A419 Sepsis, unspecified organism: Secondary | ICD-10-CM | POA: Diagnosis not present

## 2020-04-26 DIAGNOSIS — E039 Hypothyroidism, unspecified: Secondary | ICD-10-CM | POA: Diagnosis not present

## 2020-04-26 LAB — COMPREHENSIVE METABOLIC PANEL
ALT: 81 U/L — ABNORMAL HIGH (ref 0–44)
AST: 34 U/L (ref 15–41)
Albumin: 1.8 g/dL — ABNORMAL LOW (ref 3.5–5.0)
Alkaline Phosphatase: 298 U/L — ABNORMAL HIGH (ref 38–126)
Anion gap: 10 (ref 5–15)
BUN: 14 mg/dL (ref 8–23)
CO2: 32 mmol/L (ref 22–32)
Calcium: 7.8 mg/dL — ABNORMAL LOW (ref 8.9–10.3)
Chloride: 100 mmol/L (ref 98–111)
Creatinine, Ser: 0.94 mg/dL (ref 0.44–1.00)
GFR, Estimated: >60 mL/min (ref >60)
Glucose, Bld: 98 mg/dL (ref 70–99)
Potassium: 4.8 mmol/L (ref 3.5–5.1)
Sodium: 142 mmol/L (ref 135–145)
Total Bilirubin: 0.8 mg/dL (ref 0.3–1.2)
Total Protein: 5.4 g/dL — ABNORMAL LOW (ref 6.5–8.1)

## 2020-04-26 LAB — CBC
HCT: 24.3 % — ABNORMAL LOW (ref 36.0–46.0)
Hemoglobin: 7.3 g/dL — ABNORMAL LOW (ref 12.0–15.0)
MCH: 28 pg (ref 26.0–34.0)
MCHC: 30 g/dL (ref 30.0–36.0)
MCV: 93.1 fL (ref 80.0–100.0)
Platelets: 46 10*3/uL — ABNORMAL LOW (ref 150–400)
RBC: 2.61 MIL/uL — ABNORMAL LOW (ref 3.87–5.11)
RDW: 25.8 % — ABNORMAL HIGH (ref 11.5–15.5)
WBC: 10.2 10*3/uL (ref 4.0–10.5)
nRBC: 0 % (ref 0.0–0.2)

## 2020-04-26 LAB — GLUCOSE, CAPILLARY
Glucose-Capillary: 87 mg/dL (ref 70–99)
Glucose-Capillary: 50 mg/dL — ABNORMAL LOW (ref 70–99)

## 2020-04-26 LAB — GLUCOSE, RANDOM
Glucose, Bld: 106 mg/dL — ABNORMAL HIGH (ref 70–99)
Glucose, Bld: 121 mg/dL — ABNORMAL HIGH (ref 70–99)
Glucose, Bld: 86 mg/dL (ref 70–99)
Glucose, Bld: 89 mg/dL (ref 70–99)
Glucose, Bld: 91 mg/dL (ref 70–99)

## 2020-04-26 LAB — MAGNESIUM: Magnesium: 1.4 mg/dL — ABNORMAL LOW (ref 1.7–2.4)

## 2020-04-26 MED ORDER — MAGNESIUM SULFATE 4 GM/100ML IV SOLN
4.0000 g | Freq: Once | INTRAVENOUS | Status: AC
  Administered 2020-04-26: 4 g via INTRAVENOUS
  Filled 2020-04-26: qty 100

## 2020-04-26 MED ORDER — IPRATROPIUM BROMIDE 0.02 % IN SOLN: 0.5000 mg | Freq: Four times a day (QID) | RESPIRATORY_TRACT | Status: DC | PRN

## 2020-04-26 NOTE — Progress Notes (Signed)
IV team still taking blood from pt PICC line q4h per MD order because of the incorrect reading from fingerstick glucose reading.

## 2020-04-26 NOTE — Progress Notes (Signed)
PROGRESS NOTE    Michelle Avila  TKW:[id number] DOB: 08-12-1945 DOA: 04/12/2020 PCP: Venetia Maxon, Sharon Mt, MD   Brief Narrative:  The patient is a 74 year old chronically ill-appearing cachectic Caucasian female with a past medical history significant for hypothyroidism who presented with altered mental status, hypoglycemia, dehydration. From report neighbors had not seen or heard from her in several days and daughter came in from out of state and found the patient on the floor in feces and was very altered. EMS was dispatched and upon evaluation her blood sugar was 19 and so they gave her D10. She was brought to the ED and found to be dehydrated and given 2-1/2 L of IV fluids. CT head was obtained without acute changes. Blood and urine cultures were ordered but the ED felt the picture was more consistent with dehydration and hypoglycemia rather than infection so she was not started on antibiotics at that time. Her mentation largely improved in the ED and she endorsed a recent med change and was placed on a diuretic for her bilateral lower extremity edema.  Further work-up revealed that she ended up going into septic shock and hypovolemic shock from a gram-negative bacteremia likely from being in her feces and possible colitis. She was started on IV Zosyn briefly then changed to Levaquin and then to ciprofloxacin. She was placed on pressors and had encephalopathy and electrolyte derangements. They were adjusted and corrected and she was given albumin and stabilized in the ICU.  She had some dysphagia so she was n.p.o. and SLP evaluated and they are now recommending a dysphagia 2 diet with nectar thick liquids.  She is transferred to [facility] service 04/18/2020. During her hospitalization she had critical illness thrombocytopenia and anemia. She gets transfusions as needed while bone marrow recovers.   Patient's platelet count continues to drop-she received multiple platelet transfusion  and 1 unit PRBC transfusion.  Palliative care was consulted for goals of care discussion.  Thrombocytopenia was also discussed with Dr. Alen Blew who feels that this is secondary to her septic shock and her bacteremia and recommend supportive care at this time and transfusions.   Due to her ongoing right foot wound/mummification, a consult with ortho and vascular surgery was pursued. She will likely need a right AKA and intense wound care with her LLE.   10/28: Patient transferred to [facility] for further vascular surgery evaluation and ongoing wound care to LLE. Her daughter from [place] is very involved and long term plan is for patient to move to [place] with daughter once discharged from rehab.   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   -Consulted vascular Dr. Scot Dock and discussed about patient's arrival to [facility] recommendations  Left leg wounds -Patient also has extensive excoriations and wounds noted on left lower extremity.  -Consult wound care  Acute Encephalopathy: Improving -Likely multifactorial including her severe hypoglycemia, dehydration, septic shock from colitis and bacteremia on admission -CT head negative for acute findings. -Continue delirium  precautions  Volume overload in the setting of volume resuscitation as well as acute on chronic diastolic  CHF -Echocardiogram showed a normal EF but grade 2 diastolic dysfunction -BNP was elevated at 1,113.0 and improved to 437.2 -Strict I's and O's and daily weights -Continue Lasix  Acute Respiratory Failure with Hypoxia, stable -Continue Levaquin and diuresis -continue pulmonary toileting, guaifenesin 1200 mg p.o. twice daily, flutter valve, incentive spirometer, Xopenex and Atrovent breathing treatments -continue O2, remains on 4-5L  Dysphagia -SLP evaluated and recommended dysphagia 2 nectar thick diet with allowing teaspoon of thins   Severe Protein Calorie Malnutrition/Failure to Thrive -Now placed on a dysphagia 2 diet  Hypothyroidism -Patient's TSH was 9.921 and free T4 0.67 -Continue levothyroxine  Abnormal LFTs, improving -Reviewed liver Doppler done on 10/18  -Continue monitor and trend and repeat CMP in a.m.  Thrombocytopenia -Patient's Platelet Count dropped to 5 and now slowly trending up but still labile  -In the Setting of Septic Shock and her gram-negative bacteremia. oncology feels that this is secondary to her infection and not related to HUS TTP or ITP -s/p 5 units PLT for admission - continue holding chemical DVT ppx -Platelet count: 46 this morning.  Continue to monitor closely for signs of active bleeding  Hypomagnesemia: Replenished -Repeat magnesium level tomorrow AM  Normocytic anemia/anemia of critical illness - baseline roughtly 8-9 - continue trending; transfuse as needed   DVT prophylaxis: SCD  code Status: DNR Family Communication:None present at bedside.  Plan of care discussed with patient in length and she verbalized understanding and agreed with it. Disposition Plan: SNF  Consultants:   PCCM  Vascular  Orthopedic  Palliative care  Procedures:   None  Antimicrobials:   As above  Status is: Inpatient  Dispo: The patient is from: Home              Anticipated d/c is to: SNF              Anticipated d/c date is:  05/02/2020              Patient currently not medically stable for the discharge  Subjective: Patient seen and examined.  Sitting comfortably on the bed.  Denies any complaints such as bleeding from any site of body, chest pain, shortness of breath, vomiting or abdominal pain.  Objective: Vitals:   04/26/20 0005 04/26/20 0638 04/26/20 0745 04/26/20 1137  BP: (!) 152/58 (!) 133/53 (!) 154/58 (!) 164/62  Pulse: 64 (!) 57 60 66  Resp: 16 15 20 20   Temp: (!) 97.5 F (36.4 C) (!) 97.5 F (36.4 C) 97.6 F (36.4 C) (!) 97.5 F (36.4 C)  TempSrc: Oral Oral Oral Oral  SpO2: 98% 100% 90% (!) 89%  Weight:      Height:        Intake/Output Summary (Last 24 hours) at 04/26/2020 1544 Last data filed at 04/26/2020 1455 Gross per 24 hour  Intake 720 ml  Output 2027 ml  Net -1307 ml   Filed Weights   04/20/20 0500 04/22/20 0500 04/23/20 0500  Weight: 54 kg 51.2 kg 50.9 kg    Examination:  General exam: Appears calm and comfortable, on room air, communicating well, very thin and lean Respiratory system: Clear to auscultation. Respiratory effort normal. Cardiovascular system: S1 & S2 heard, RRR. No JVD, murmurs, rubs, gallops or clicks. No pedal edema. Gastrointestinal system: Abdomen is nondistended, soft and nontender. No organomegaly or masses felt. Normal bowel sounds heard. Central nervous system: Alert and  oriented. No focal neurological deficits. Extremities: Both legs and feet in [place] boots.  Dressing dry intact. Psychiatry: Judgement and insight appear normal. Mood & affect appropriate.    Data Reviewed: I have personally reviewed following labs and imaging studies  CBC: Recent Labs  Lab 04/21/20 0850 04/21/20 0850 04/22/20 0519 04/23/20 0330 04/24/20 0402 04/25/20 0415 04/26/20 0413  WBC 10.6*   < > 10.8* 12.0* 11.2* 10.2 10.2  NEUTROABS 9.3*  --  9.6* 10.6* 9.6* 8.6*  --   HGB 8.5*   < > 8.1* 7.6* 7.8* 7.5* 7.3*  HCT 27.1*   < > 26.0* 24.7* 24.8* 24.6* 24.3*  MCV  85.2   < > 86.7 88.2 89.9 89.8 93.1  PLT 18*   < > 29* 19* 19* 49* 46*   < > = values in this interval not displayed.   Basic Metabolic Panel: Recent Labs  Lab 04/21/20 0850 04/21/20 0850 04/22/20 0519 04/22/20 0519 04/23/20 0330 04/23/20 0330 04/24/20 0402 04/24/20 0402 04/25/20 0415 04/25/20 2356 04/26/20 0413 04/26/20 0800 04/26/20 1100  NA 144   < > 146*  --  147*  --  148*  --  143  --   --   --  142  K 3.0*   < > 3.6  --  3.3*  --  4.5  --  4.1  --   --   --  4.8  CL 104   < > 104  --  105  --  104  --  99  --   --   --  100  CO2 27   < > 33*  --  34*  --  35*  --  33*  --   --   --  32  GLUCOSE 109*   < > 101*   < > 87   < > 83   < > 81 121* 89 91 98  BUN 45*   < > 38*  --  30*  --  22  --  21  --   --   --  14  CREATININE 1.55*   < > 1.19*  --  0.98  --  0.90  --  0.92  --   --   --  0.94  CALCIUM 7.0*   < > 7.3*  --  7.2*  --  7.5*  --  7.3*  --   --   --  7.8*  MG 1.8   < > 2.1  --  1.8  --  1.6*  --  1.4*  --   --   --  1.4*  PHOS 3.7  --  3.9  --  3.3  --  3.0  --  3.3  --   --   --   --    < > = values in this interval not displayed.   GFR: Estimated Creatinine Clearance: 41.6 mL/min (by C-G formula based on SCr of 0.94 mg/dL). Liver Function Tests: Recent Labs  Lab 04/22/20 0519 04/23/20 0330 04/24/20 0402 04/25/20 0415 04/26/20 1100  AST 38 36 52* 37 34  ALT 43 38 40 38 81*  ALKPHOS 355* 333* 371* 329* 298*  BILITOT 1.0 0.9 1.0 0.8 0.8  PROT 5.0* 4.8* 4.9* 5.0* 5.4*  ALBUMIN 1.9* 1.9* 1.8* 1.9* 1.8*   No results for input(s): LIPASE, AMYLASE in the last 168 hours. No results for input(s): AMMONIA in the last 168 hours. Coagulation Profile: No results for input(s): INR, PROTIME in the last  168 hours. Cardiac Enzymes: No results for input(s): CKTOTAL, CKMB, CKMBINDEX, TROPONINI in the last 168 hours. BNP (last 3 results) No results for input(s): PROBNP in the last 8760 hours. HbA1C: No results for input(s): HGBA1C in the last 72  hours. CBG: Recent Labs  Lab 04/25/20 1926 04/25/20 1935 04/25/20 1943 04/25/20 1945 04/26/20 0415  GLUCAP 27* 25* 277* 91 87   Lipid Profile: No results for input(s): CHOL, HDL, LDLCALC, TRIG, CHOLHDL, LDLDIRECT in the last 72 hours. Thyroid Function Tests: No results for input(s): TSH, T4TOTAL, FREET4, T3FREE, THYROIDAB in the last 72 hours. Anemia Panel: No results for input(s): VITAMINB12, FOLATE, FERRITIN, TIBC, IRON, RETICCTPCT in the last 72 hours. Sepsis Labs: No results for input(s): PROCALCITON, LATICACIDVEN in the last 168 hours.  No results found for this or any previous visit (from the past 240 hour(s)).    Radiology Studies: No results found.  Scheduled Meds: . Chlorhexidine Gluconate Cloth  6 each Topical Daily  . collagenase   Topical Daily  . feeding supplement  237 mL Oral BID BM  . furosemide  40 mg Intravenous BID  . guaiFENesin  1,200 mg Oral BID  . levofloxacin  750 mg Oral Q48H  . levothyroxine  112 mcg Oral Q0600  . mouth rinse  15 mL Mouth Rinse BID  . multivitamin with minerals  1 tablet Oral Daily  . potassium chloride  40 mEq Oral BID  . sodium chloride flush  10-40 mL Intracatheter Q12H   Continuous Infusions: . sodium chloride 250 mL (04/24/20 1835)  . magnesium sulfate bolus IVPB       LOS: 13 days   Time spent: 45 minutes   Ladana Chavero Loann Quill, MD [facility]  If 7PM-7AM, please contact night-coverage [email or web address] 04/26/2020, 3:44 PM

## 2020-04-26 NOTE — Progress Notes (Signed)
   VASCULAR SURGERY ASSESSMENT & PLAN:   NONSALVAGEABLE RIGHT LOWER EXTREMITY: I was notified of the patient's transfer from [facility].  As per Dr. Ainsley Spinner note on 04/24/2020 patient needs a right above-the-knee amputation.  We could potentially schedule this for Tuesday.  There is a small chance it could be done Monday.  However she has significant thrombocytopenia.  We will see what her platelet count is tomorrow.  If it remains low she may need preoperative platelet transfusion.   SUBJECTIVE:   No specific complaints.  PHYSICAL EXAM:   Vitals:   04/26/20 0005 04/26/20 0638 04/26/20 0745 04/26/20 1137  BP: (!) 152/58 (!) 133/53 (!) 154/58 (!) 164/62  Pulse: 64 (!) 57 60 66  Resp: 16 15 20 20   Temp: (!) 97.5 F (36.4 C) (!) 97.5 F (36.4 C) 97.6 F (36.4 C) (!) 97.5 F (36.4 C)  TempSrc: Oral Oral Oral Oral  SpO2: 98% 100% 90% 98%  Weight:      Height:       No motor or sensory functions of right lower extremity  LABS:   Lab Results  Component Value Date   WBC 10.2 04/26/2020   HGB 7.3 (L) 04/26/2020   HCT 24.3 (L) 04/26/2020   MCV 93.1 04/26/2020   PLT 46 (L) 04/26/2020   Lab Results  Component Value Date   CREATININE 0.94 04/26/2020   Lab Results  Component Value Date   INR 1.6 (H) 04/14/2020   CBG (last 3)  Recent Labs    04/25/20 1945 04/26/20 0415 04/26/20 1643  GLUCAP 91 87 50*    PROBLEM LIST:    Principal Problem:   Septic shock (HCC) Active Problems:   Dehydration   Shock (HCC)   Pressure injury of skin   Pulmonary infiltrates on CXR   Altered mental status   Thrombocytopenia (HCC)   CURRENT MEDS:   . Chlorhexidine Gluconate Cloth  6 each Topical Daily  . collagenase   Topical Daily  . feeding supplement  237 mL Oral BID BM  . furosemide  40 mg Intravenous BID  . guaiFENesin  1,200 mg Oral BID  . levofloxacin  750 mg Oral Q48H  . levothyroxine  112 mcg Oral Q0600  . mouth rinse  15 mL Mouth Rinse BID  . multivitamin  with minerals  1 tablet Oral Daily  . potassium chloride  40 mEq Oral BID  . sodium chloride flush  10-40 mL Intracatheter [place] Office: [phone] 04/26/2020

## 2020-04-26 NOTE — Plan of Care (Signed)
Patient is currently stable at this time, was transferred from [facility] earlier this evening. IV team will be taking blood sugars via PICC line q4h per doctor's order due to fingerstick blood sugars not being accurate. Consults with ortho and vascular while here at [facility] - multiple wounds to bilateral lower exts, upper exts, and sacrum. On pressure mattress to shift and help with wound healing. Foley catheter in place. Will continue to monitor and proceed with POC.

## 2020-04-27 DIAGNOSIS — R6521 Severe sepsis with septic shock: Secondary | ICD-10-CM | POA: Diagnosis not present

## 2020-04-27 DIAGNOSIS — A419 Sepsis, unspecified organism: Secondary | ICD-10-CM | POA: Diagnosis not present

## 2020-04-27 LAB — CBC WITH DIFFERENTIAL/PLATELET
Abs Immature Granulocytes: 0.08 10*3/uL — ABNORMAL HIGH (ref 0.00–0.07)
Basophils Absolute: 0 10*3/uL (ref 0.0–0.1)
Basophils Relative: 0 %
Eosinophils Absolute: 0.1 10*3/uL (ref 0.0–0.5)
Eosinophils Relative: 1 %
HCT: 26.8 % — ABNORMAL LOW (ref 36.0–46.0)
Hemoglobin: 8 g/dL — ABNORMAL LOW (ref 12.0–15.0)
Immature Granulocytes: 1 %
Lymphocytes Relative: 5 %
Lymphs Abs: 0.6 10*3/uL — ABNORMAL LOW (ref 0.7–4.0)
MCH: 26.8 pg (ref 26.0–34.0)
MCHC: 29.9 g/dL — ABNORMAL LOW (ref 30.0–36.0)
MCV: 89.9 fL (ref 80.0–100.0)
Monocytes Absolute: 0.7 10*3/uL (ref 0.1–1.0)
Monocytes Relative: 6 %
Neutro Abs: 9.2 10*3/uL — ABNORMAL HIGH (ref 1.7–7.7)
Neutrophils Relative %: 87 %
Platelets: 63 10*3/uL — ABNORMAL LOW (ref 150–400)
RBC: 2.98 MIL/uL — ABNORMAL LOW (ref 3.87–5.11)
RDW: 25.8 % — ABNORMAL HIGH (ref 11.5–15.5)
WBC: 10.6 10*3/uL — ABNORMAL HIGH (ref 4.0–10.5)
nRBC: 0 % (ref 0.0–0.2)

## 2020-04-27 LAB — PREPARE PLATELET PHERESIS
Unit division: 0
Unit division: 0

## 2020-04-27 LAB — PHOSPHORUS: Phosphorus: 3.8 mg/dL (ref 2.5–4.6)

## 2020-04-27 LAB — BPAM PLATELET PHERESIS
Blood Product Expiration Date: 202111022359
Blood Product Expiration Date: 202111032359
Unit Type and Rh: 5100
Unit Type and Rh: 6200

## 2020-04-27 LAB — COMPREHENSIVE METABOLIC PANEL
ALT: 32 U/L (ref 0–44)
AST: 27 U/L (ref 15–41)
Albumin: 1.7 g/dL — ABNORMAL LOW (ref 3.5–5.0)
Alkaline Phosphatase: 269 U/L — ABNORMAL HIGH (ref 38–126)
Anion gap: 7 (ref 5–15)
BUN: 11 mg/dL (ref 8–23)
CO2: 33 mmol/L — ABNORMAL HIGH (ref 22–32)
Calcium: 7.8 mg/dL — ABNORMAL LOW (ref 8.9–10.3)
Chloride: 99 mmol/L (ref 98–111)
Creatinine, Ser: 0.96 mg/dL (ref 0.44–1.00)
GFR, Estimated: >60 mL/min (ref >60)
Glucose, Bld: 84 mg/dL (ref 70–99)
Potassium: 4.5 mmol/L (ref 3.5–5.1)
Sodium: 139 mmol/L (ref 135–145)
Total Bilirubin: 0.9 mg/dL (ref 0.3–1.2)
Total Protein: 5.2 g/dL — ABNORMAL LOW (ref 6.5–8.1)

## 2020-04-27 LAB — GLUCOSE, RANDOM
Glucose, Bld: 75 mg/dL (ref 70–99)
Glucose, Bld: 76 mg/dL (ref 70–99)
Glucose, Bld: 80 mg/dL (ref 70–99)
Glucose, Bld: 87 mg/dL (ref 70–99)
Glucose, Bld: 96 mg/dL (ref 70–99)

## 2020-04-27 LAB — MAGNESIUM: Magnesium: 2.4 mg/dL (ref 1.7–2.4)

## 2020-04-27 LAB — PREPARE RBC (CROSSMATCH)

## 2020-04-27 MED ORDER — SODIUM CHLORIDE 0.9% IV SOLUTION: Freq: Once | INTRAVENOUS | Status: DC

## 2020-04-27 MED ORDER — OXYCODONE HCL 5 MG PO TABS
10.0000 mg | ORAL_TABLET | ORAL | Status: DC | PRN
  Administered 2020-04-27 – 2020-05-01 (×8): 10 mg via ORAL
  Filled 2020-04-27 (×8): qty 2

## 2020-04-27 MED ORDER — CEFAZOLIN SODIUM-DEXTROSE 1-4 GM/50ML-% IV SOLN
1.0000 g | INTRAVENOUS | Status: AC
  Administered 2020-04-28: 1 g via INTRAVENOUS
  Filled 2020-04-27: qty 50

## 2020-04-27 MED ORDER — HYDROMORPHONE HCL 1 MG/ML IJ SOLN: 0.5000 mg | INTRAMUSCULAR | Status: DC | PRN

## 2020-04-27 NOTE — Plan of Care (Signed)
No acute changes since the previous night that I took care of her. Plan for possible right AKA tomorrow 11/1 pending CBC results - particularly platelet count. Patient will be NPO after MN except sips with meds. Will continue to monitor and continue current POC.

## 2020-04-27 NOTE — Progress Notes (Addendum)
   VASCULAR SURGERY ASSESSMENT & PLAN:   NONSALVAGEABLE RIGHT LOWER EXTREMITY: She will need a right above-the-knee amputation. I am trying to get this on the schedule for tomorrow. Whe may need platelets preop depending upon her AM CBC.   THROMBOCYTOPENIA: Her platelet count has improved to 63,000.  I will order a follow-up CBC in the morning.  I have also typed and crossed her for 2 units for surgery.  The plan is to follow the wounds on the left leg given that she has good blood flow in the left and the wounds were not as extensive.  I have written preop orders.  SUBJECTIVE:   No specific complaints.  PHYSICAL EXAM:   Vitals:   04/26/20 0745 04/26/20 1137 04/26/20 2123 04/27/20 0327  BP: (!) 154/58 (!) 164/62 (!) 142/69 135/72  Pulse: 60 66 90 69  Resp: 20 20 16 15   Temp: 97.6 F (36.4 C) (!) 97.5 F (36.4 C) 98 F (36.7 C) (!) 97.3 F (36.3 C)  TempSrc: Oral Oral Oral Axillary  SpO2: 90% 98% 100% 100%  Weight:      Height:       Nonsalvageable right lower extremity. Brisk dorsalis pedis and posterior tibial signal on the left.  LABS:   Lab Results  Component Value Date   WBC 10.6 (H) 04/27/2020   HGB 8.0 (L) 04/27/2020   HCT 26.8 (L) 04/27/2020   MCV 89.9 04/27/2020   PLT 63 (L) 04/27/2020   Lab Results  Component Value Date   CREATININE 0.96 04/27/2020   Lab Results  Component Value Date   INR 1.6 (H) 04/14/2020   CBG (last 3)  Recent Labs    04/25/20 1945 04/26/20 0415 04/26/20 1643  GLUCAP 91 87 50*    PROBLEM LIST:    Principal Problem:   Septic shock ([place]) Active Problems:   Dehydration   Shock ([place])   Pressure injury of skin   Pulmonary infiltrates on CXR   Altered mental status   Thrombocytopenia (HCC)   CURRENT MEDS:   . Chlorhexidine Gluconate Cloth  6 each Topical Daily  . collagenase   Topical Daily  . feeding supplement  237 mL Oral BID BM  . furosemide  40 mg Intravenous BID  . guaiFENesin  1,200 mg Oral BID  .  levofloxacin  750 mg Oral Q48H  . levothyroxine  112 mcg Oral Q0600  . mouth rinse  15 mL Mouth Rinse BID  . multivitamin with minerals  1 tablet Oral Daily  . potassium chloride  40 mEq Oral BID  . sodium chloride flush  10-40 mL Intracatheter [place] Office: [phone] 04/27/2020

## 2020-04-27 NOTE — Progress Notes (Signed)
PROGRESS NOTE    PAMELYN BANCROFT  XBD:[id number] DOB: 1946-05-15 DOA: 04/12/2020 PCP: Venetia Maxon, Sharon Mt, MD   Brief Narrative:  The patient is a 74 year old female with PMH for hypothyroidism who presented with AMS, hypoglycemia, dehydration. From report neighbors had not seen or heard from her in several days and daughter came in from out of state and found the patient on the floor in feces and was very altered. EMS was dispatched and upon evaluation her blood sugar was 19 and so they gave her D10. She was brought to the ED and found to be dehydrated and given 2-1/2 L of IV fluids. CT head was obtained without acute changes. Blood and urine cultures were ordered but the ED felt the picture was more consistent with dehydration and hypoglycemia rather than infection so she was not started on antibiotics at that time. Her mentation largely improved in the ED and she endorsed a recent med change and was placed on a diuretic for her bilateral lower extremity edema.  Further work-up revealed that she ended up going into septic shock and hypovolemic shock from a gram-negative bacteremia likely from being in her feces and possible colitis. She was started on IV Zosyn briefly then changed to Levaquin and then to ciprofloxacin. She was placed on pressors and had encephalopathy and electrolyte derangements. They were adjusted and corrected and she was given albumin and stabilized in the ICU.  She had some dysphagia so she was n.p.o. and SLP evaluated and they are now recommending a dysphagia 2 diet with nectar thick liquids.  She is transferred to [facility] service 04/18/2020. During her hospitalization she had critical illness thrombocytopenia and anemia. She gets transfusions as needed while bone marrow recovers from sepsis.   Patient's platelet count continues to drop-she received multiple platelet transfusion and 1 unit PRBC transfusion.  Palliative care was consulted for goals of care  discussion.  Thrombocytopenia was also discussed with Dr. Alen Blew who feels that this is secondary to her septic shock and her bacteremia and recommend supportive care at this time and transfusions.   Due to her ongoing right foot wound/mummification, a consult with ortho and vascular surgery was pursued. She will likely need a right AKA and intense wound care with her LLE therefore she transferred to [facility] on 10/28..   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 -PT OT evaluation done and recommending skilled nursing facility post acute hospitalization discharge  Right foot wound -Patient transfered to [facility] for [facility] intervention.   -Appreciate vascular surgery help -plan is for possible procedure tomorrow if CBC remains stable  -Oxycodone/Dilaudid as needed for pain control. -N.p.o. after midnight.  left leg wounds -Patient also has extensive excoriations and wounds noted on left lower extremity.  -Consult wound care  Acute Encephalopathy: Improving -Likely multifactorial including her severe hypoglycemia, dehydration, septic shock from colitis and bacteremia -CT head negative for acute findings. -Continue delirium precautions  Volume overload : Improving  -in the setting of volume resuscitation as well as acute on chronic diastolic CHF -Echocardiogram showed a normal EF but grade 2 diastolic dysfunction -BNP was elevated at 1,113.0 and  improved to 437.2 -Strict I's and O's and daily weights -Continue Lasix  Acute Respiratory Failure with Hypoxia:  Improving -Continue diuresis.  Patient finished Levaquin for 14 days. -continue pulmonary toileting, guaifenesin 1200 mg p.o. twice daily, flutter valve, incentive spirometer, Xopenex and Atrovent breathing treatments -On room air.  Dysphagia -SLP evaluated and recommended dysphagia 2 nectar thick diet with allowing teaspoon of thins   Severe Protein Calorie Malnutrition/Failure to Thrive -Now placed on a dysphagia 2 diet  Hypothyroidism -Patient's TSH was 9.921 and free T4 0.67 -Continue levothyroxine  Abnormal LFTs, improving -Reviewed liver Doppler done on 10/18  -Continue monitor and trend and repeat CMP in a.m.  Thrombocytopenia -Patient's Platelet Count dropped to 5 and now slowly trending up but still labile  -In the Setting of Septic Shock and her gram-negative bacteremia. oncology feels that this is secondary to her infection and not related to HUS TTP or ITP -s/p 5 units PLT for admission -continue holding chemical DVT ppx -Platelet count: 63 this morning.  Hypomagnesemia: Resolved  Normocytic anemia/anemia of critical illness - baseline roughtly 8-9 - continue trending; transfuse as needed   DVT prophylaxis: SCD  code Status: DNR Family Communication:None present at bedside.  Plan of care discussed with patient in length and she verbalized understanding and agreed with it.  I called patient's daughter and discussed plan of care and she verbalized understanding.  Disposition Plan: SNF  Consultants:   PCCM  Vascular  Orthopedic  Palliative care  Procedures:   None  Antimicrobials:   As above  Status is: Inpatient  Dispo: The patient is from: Home              Anticipated d/c is to: SNF              Anticipated d/c date is: 05/02/2020              Patient currently not medically stable for the discharge  Subjective: Patient seen and examined.  Sitting comfortably on the bed.  Tells me that she is feeling fine and has no complaints.   Agreeable and ready to undergo right AKA tomorrow a.m.  Objective: Vitals:   04/26/20 1137 04/26/20 2123 04/27/20 0327 04/27/20 0857  BP: (!) 164/62 (!) 142/69 135/72 130/71  Pulse: 66 90 69 72  Resp: 20 16 15 16   Temp: (!) 97.5 F (36.4 C) 98 F (36.7 C) (!) 97.3 F (36.3 C) 98.5 F (36.9 C)  TempSrc: Oral Oral Axillary Oral  SpO2: 98% 100% 100% 100%  Weight:      Height:        Intake/Output Summary (Last 24 hours) at 04/27/2020 1136 Last data filed at 04/26/2020 1800 Gross per 24 hour  Intake 240 ml  Output 401 ml  Net -161 ml   Filed Weights   04/20/20 0500 04/22/20 0500 04/23/20 0500  Weight: 54 kg 51.2 kg 50.9 kg    Examination: General exam: Appears calm and comfortable, on room air, communicating well, thin and lean. Respiratory system: Clear to auscultation. Respiratory effort normal. Cardiovascular system: S1 & S2 heard, RRR. No JVD, murmurs, rubs, gallops or clicks. No pedal edema. Gastrointestinal system: Abdomen is nondistended, soft and nontender. No organomegaly or masses felt. Normal bowel sounds heard. Central nervous system: Alert and oriented. No focal neurological deficits. Extremities: legs & feet-in unna boots-dressing dry intact. Psychiatry: Judgement and insight appear normal. Mood & affect appropriate.   Data Reviewed: I have personally reviewed following labs and imaging studies  CBC:  Recent Labs  Lab 04/22/20 0519 04/22/20 0519 04/23/20 0330 04/24/20 0402 04/25/20 0415 04/26/20 0413 04/27/20 0423  WBC 10.8*   < > 12.0* 11.2* 10.2 10.2 10.6*  NEUTROABS 9.6*  --  10.6* 9.6* 8.6*  --  9.2*  HGB 8.1*   < > 7.6* 7.8* 7.5* 7.3* 8.0*  HCT 26.0*   < > 24.7* 24.8* 24.6* 24.3* 26.8*  MCV 86.7   < > 88.2 89.9 89.8 93.1 89.9  PLT 29*   < > 19* 19* 49* 46* 63*   < > = values in this interval not displayed.   Basic Metabolic Panel: Recent Labs  Lab 04/22/20 0519 04/22/20 0519 04/23/20 0330 04/23/20 0330 04/24/20 0402 04/24/20 0402  04/25/20 0415 04/25/20 2356 04/26/20 1100 04/26/20 1100 04/26/20 1541 04/26/20 2000 04/27/20 0000 04/27/20 0423 04/27/20 0825  NA 146*   < > 147*  --  148*  --  143  --  142  --   --   --   --  139  --   K 3.6   < > 3.3*  --  4.5  --  4.1  --  4.8  --   --   --   --  4.5  --   CL 104   < > 105  --  104  --  99  --  100  --   --   --   --  99  --   CO2 33*   < > 34*  --  35*  --  33*  --  32  --   --   --   --  33*  --   GLUCOSE 101*   < > 87   < > 83   < > 81   < > 98   < > 86 106* 87 84 75  BUN 38*   < > 30*  --  22  --  21  --  14  --   --   --   --  11  --   CREATININE 1.19*   < > 0.98  --  0.90  --  0.92  --  0.94  --   --   --   --  0.96  --   CALCIUM 7.3*   < > 7.2*  --  7.5*  --  7.3*  --  7.8*  --   --   --   --  7.8*  --   MG 2.1   < > 1.8  --  1.6*  --  1.4*  --  1.4*  --   --   --   --  2.4  --   PHOS 3.9  --  3.3  --  3.0  --  3.3  --   --   --   --   --   --  3.8  --    < > = values in this interval not displayed.   GFR: Estimated Creatinine Clearance: 40.7 mL/min (by C-G formula based on SCr of 0.96 mg/dL). Liver Function Tests: Recent Labs  Lab 04/23/20 0330 04/24/20 0402 04/25/20 0415 04/26/20 1100 04/27/20 0423  AST 36 52* 37 34 27  ALT 38 40 38 81* 32  ALKPHOS 333* 371* 329* 298* 269*  BILITOT 0.9 1.0 0.8 0.8 0.9  PROT 4.8* 4.9* 5.0* 5.4* 5.2*  ALBUMIN 1.9* 1.8* 1.9* 1.8* 1.7*   No results for input(s): LIPASE, AMYLASE in the last 168 hours. No  results for input(s): AMMONIA in the last 168 hours. Coagulation Profile: No results for input(s): INR, PROTIME in the last 168 hours. Cardiac Enzymes: No results for input(s): CKTOTAL, CKMB, CKMBINDEX, TROPONINI in the last 168 hours. BNP (last 3 results) No results for input(s): PROBNP in the last 8760 hours. HbA1C: No results for input(s): HGBA1C in the last 72 hours. CBG: Recent Labs  Lab 04/25/20 1935 04/25/20 1943 04/25/20 1945 04/26/20 0415 04/26/20 1643  GLUCAP 25* 277* 91 87 50*   Lipid  Profile: No results for input(s): CHOL, HDL, LDLCALC, TRIG, CHOLHDL, LDLDIRECT in the last 72 hours. Thyroid Function Tests: No results for input(s): TSH, T4TOTAL, FREET4, T3FREE, THYROIDAB in the last 72 hours. Anemia Panel: No results for input(s): VITAMINB12, FOLATE, FERRITIN, TIBC, IRON, RETICCTPCT in the last 72 hours. Sepsis Labs: No results for input(s): PROCALCITON, LATICACIDVEN in the last 168 hours.  No results found for this or any previous visit (from the past 240 hour(s)).    Radiology Studies: No results found.  Scheduled Meds: . sodium chloride   Intravenous Once  . sodium chloride   Intravenous Once  . sodium chloride   Intravenous Once  . Chlorhexidine Gluconate Cloth  6 each Topical Daily  . feeding supplement  237 mL Oral BID BM  . furosemide  40 mg Intravenous BID  . guaiFENesin  1,200 mg Oral BID  . levothyroxine  112 mcg Oral Q0600  . mouth rinse  15 mL Mouth Rinse BID  . multivitamin with minerals  1 tablet Oral Daily  . potassium chloride  40 mEq Oral BID  . sodium chloride flush  10-40 mL Intracatheter Q12H   Continuous Infusions: . sodium chloride 250 mL (04/24/20 1835)  . [START ON 04/28/2020]  ceFAZolin (ANCEF) IV       LOS: 14 days   Time spent: 40 minutes   Can Lucci Loann Quill, MD [facility]  If 7PM-7AM, please contact night-coverage [email or web address] 04/27/2020, 11:36 AM

## 2020-04-27 NOTE — Plan of Care (Signed)
No acute changes from the previous night that I took care of her. Patient was seen by vascular today 10/30 - right AKA possibly either Monday or Tuesday pending improvement of thrombocytopenia. No prns given for patient at this time. Wound care to bilateral lower extremities will be done this morning. Will continue to monitor and continue current POC.

## 2020-04-28 ENCOUNTER — Encounter (HOSPITAL_COMMUNITY)
Admission: EM | Disposition: A | Discharge status: Skilled Nursing Facility | Payer: Self-pay | Source: Home / Self Care | Attending: Internal Medicine

## 2020-04-28 ENCOUNTER — Encounter (HOSPITAL_COMMUNITY): Payer: Self-pay | Admitting: Pulmonary Disease

## 2020-04-28 ENCOUNTER — Inpatient Hospital Stay (HOSPITAL_COMMUNITY): Payer: Medicare Other | Admitting: Certified Registered Nurse Anesthetist

## 2020-04-28 DIAGNOSIS — R6521 Severe sepsis with septic shock: Secondary | ICD-10-CM | POA: Diagnosis not present

## 2020-04-28 DIAGNOSIS — E43 Unspecified severe protein-calorie malnutrition: Secondary | ICD-10-CM | POA: Insufficient documentation

## 2020-04-28 DIAGNOSIS — A419 Sepsis, unspecified organism: Secondary | ICD-10-CM | POA: Diagnosis not present

## 2020-04-28 HISTORY — PX: AMPUTATION: SHX166

## 2020-04-28 LAB — COMPREHENSIVE METABOLIC PANEL
ALT: 26 U/L (ref 0–44)
AST: 23 U/L (ref 15–41)
Albumin: 1.5 g/dL — ABNORMAL LOW (ref 3.5–5.0)
Alkaline Phosphatase: 217 U/L — ABNORMAL HIGH (ref 38–126)
Anion gap: 7 (ref 5–15)
BUN: 14 mg/dL (ref 8–23)
CO2: 33 mmol/L — ABNORMAL HIGH (ref 22–32)
Calcium: 7.6 mg/dL — ABNORMAL LOW (ref 8.9–10.3)
Chloride: 99 mmol/L (ref 98–111)
Creatinine, Ser: 1.06 mg/dL — ABNORMAL HIGH (ref 0.44–1.00)
GFR, Estimated: 55 mL/min — ABNORMAL LOW (ref >60)
Glucose, Bld: 77 mg/dL (ref 70–99)
Potassium: 4.1 mmol/L (ref 3.5–5.1)
Sodium: 139 mmol/L (ref 135–145)
Total Bilirubin: 1 mg/dL (ref 0.3–1.2)
Total Protein: 4.7 g/dL — ABNORMAL LOW (ref 6.5–8.1)

## 2020-04-28 LAB — PHOSPHORUS: Phosphorus: 4.5 mg/dL (ref 2.5–4.6)

## 2020-04-28 LAB — CBC WITH DIFFERENTIAL/PLATELET
Abs Immature Granulocytes: 0.08 10*3/uL — ABNORMAL HIGH (ref 0.00–0.07)
Basophils Absolute: 0.1 10*3/uL (ref 0.0–0.1)
Basophils Relative: 1 %
Eosinophils Absolute: 0 10*3/uL (ref 0.0–0.5)
Eosinophils Relative: 0 %
HCT: 23.7 % — ABNORMAL LOW (ref 36.0–46.0)
Hemoglobin: 7.1 g/dL — ABNORMAL LOW (ref 12.0–15.0)
Immature Granulocytes: 1 %
Lymphocytes Relative: 6 %
Lymphs Abs: 0.6 10*3/uL — ABNORMAL LOW (ref 0.7–4.0)
MCH: 27.8 pg (ref 26.0–34.0)
MCHC: 30 g/dL (ref 30.0–36.0)
MCV: 92.9 fL (ref 80.0–100.0)
Monocytes Absolute: 0.7 10*3/uL (ref 0.1–1.0)
Monocytes Relative: 8 %
Neutro Abs: 7.8 10*3/uL — ABNORMAL HIGH (ref 1.7–7.7)
Neutrophils Relative %: 84 %
Platelets: 82 10*3/uL — ABNORMAL LOW (ref 150–400)
RBC: 2.55 MIL/uL — ABNORMAL LOW (ref 3.87–5.11)
RDW: 26.5 % — ABNORMAL HIGH (ref 11.5–15.5)
WBC: 9.2 10*3/uL (ref 4.0–10.5)
nRBC: 0 % (ref 0.0–0.2)

## 2020-04-28 LAB — GLUCOSE, CAPILLARY
Glucose-Capillary: 27 mg/dL — CL (ref 70–99)
Glucose-Capillary: 124 mg/dL — ABNORMAL HIGH (ref 70–99)
Glucose-Capillary: 67 mg/dL — ABNORMAL LOW (ref 70–99)

## 2020-04-28 LAB — MAGNESIUM: Magnesium: 2.4 mg/dL (ref 1.7–2.4)

## 2020-04-28 LAB — GLUCOSE, RANDOM
Glucose, Bld: 71 mg/dL (ref 70–99)
Glucose, Bld: 73 mg/dL (ref 70–99)
Glucose, Bld: 76 mg/dL (ref 70–99)
Glucose, Bld: 84 mg/dL (ref 70–99)

## 2020-04-28 SURGERY — AMPUTATION, ABOVE KNEE
Anesthesia: General | Site: Knee | Laterality: Right

## 2020-04-28 MED ORDER — SUCCINYLCHOLINE CHLORIDE 200 MG/10ML IV SOSY
PREFILLED_SYRINGE | INTRAVENOUS | Status: AC
  Filled 2020-04-28: qty 10

## 2020-04-28 MED ORDER — PHENYLEPHRINE 40 MCG/ML (10ML) SYRINGE FOR IV PUSH (FOR BLOOD PRESSURE SUPPORT)
PREFILLED_SYRINGE | INTRAVENOUS | Status: AC
  Filled 2020-04-28: qty 10

## 2020-04-28 MED ORDER — EPHEDRINE SULFATE-NACL 50-0.9 MG/10ML-% IV SOSY
PREFILLED_SYRINGE | INTRAVENOUS | Status: DC | PRN
  Administered 2020-04-28 (×2): 5 mg via INTRAVENOUS

## 2020-04-28 MED ORDER — MEPERIDINE HCL 25 MG/ML IJ SOLN: 6.2500 mg | INTRAMUSCULAR | Status: DC | PRN

## 2020-04-28 MED ORDER — PROPOFOL 10 MG/ML IV BOLUS
INTRAVENOUS | Status: DC | PRN
  Administered 2020-04-28: 80 mg via INTRAVENOUS

## 2020-04-28 MED ORDER — LIDOCAINE 2% (20 MG/ML) 5 ML SYRINGE
INTRAMUSCULAR | Status: DC | PRN
  Administered 2020-04-28: 40 mg via INTRAVENOUS

## 2020-04-28 MED ORDER — ACETAMINOPHEN 10 MG/ML IV SOLN
INTRAVENOUS | Status: AC
  Filled 2020-04-28: qty 100

## 2020-04-28 MED ORDER — EPHEDRINE 5 MG/ML INJ
INTRAVENOUS | Status: AC
  Filled 2020-04-28: qty 10

## 2020-04-28 MED ORDER — 0.9 % SODIUM CHLORIDE (POUR BTL) OPTIME
TOPICAL | Status: DC | PRN
  Administered 2020-04-28: 1000 mL

## 2020-04-28 MED ORDER — DEXTROSE 50 % IV SOLN
INTRAVENOUS | Status: DC | PRN
  Administered 2020-04-28: 12.5 g via INTRAVENOUS

## 2020-04-28 MED ORDER — LACTATED RINGERS IV SOLN: INTRAVENOUS | Status: DC

## 2020-04-28 MED ORDER — PROPOFOL 10 MG/ML IV BOLUS
INTRAVENOUS | Status: AC
  Filled 2020-04-28: qty 20

## 2020-04-28 MED ORDER — ONDANSETRON HCL 4 MG/2ML IJ SOLN
INTRAMUSCULAR | Status: AC
  Filled 2020-04-28: qty 2

## 2020-04-28 MED ORDER — FENTANYL CITRATE (PF) 250 MCG/5ML IJ SOLN
INTRAMUSCULAR | Status: AC
  Filled 2020-04-28: qty 5

## 2020-04-28 MED ORDER — DEXTROSE 50 % IV SOLN
INTRAVENOUS | Status: AC
  Filled 2020-04-28: qty 50

## 2020-04-28 MED ORDER — CHLORHEXIDINE GLUCONATE 0.12 % MT SOLN
OROMUCOSAL | Status: AC
  Filled 2020-04-28: qty 15

## 2020-04-28 MED ORDER — PHENYLEPHRINE 40 MCG/ML (10ML) SYRINGE FOR IV PUSH (FOR BLOOD PRESSURE SUPPORT)
PREFILLED_SYRINGE | INTRAVENOUS | Status: DC | PRN
  Administered 2020-04-28: 80 ug via INTRAVENOUS
  Administered 2020-04-28: 40 ug via INTRAVENOUS
  Administered 2020-04-28: 80 ug via INTRAVENOUS
  Administered 2020-04-28: 40 ug via INTRAVENOUS

## 2020-04-28 MED ORDER — FENTANYL CITRATE (PF) 100 MCG/2ML IJ SOLN
INTRAMUSCULAR | Status: AC
  Administered 2020-04-28: 25 ug via INTRAVENOUS
  Filled 2020-04-28: qty 2

## 2020-04-28 MED ORDER — AMISULPRIDE (ANTIEMETIC) 5 MG/2ML IV SOLN: 10.0000 mg | Freq: Once | INTRAVENOUS | Status: DC | PRN

## 2020-04-28 MED ORDER — LACTATED RINGERS IV SOLN: INTRAVENOUS | Status: DC | PRN

## 2020-04-28 MED ORDER — ACETAMINOPHEN 325 MG PO TABS: 325.0000 mg | ORAL_TABLET | Freq: Once | ORAL | Status: DC | PRN

## 2020-04-28 MED ORDER — ONDANSETRON HCL 4 MG/2ML IJ SOLN
INTRAMUSCULAR | Status: DC | PRN
  Administered 2020-04-28: 4 mg via INTRAVENOUS

## 2020-04-28 MED ORDER — ACETAMINOPHEN 10 MG/ML IV SOLN
1000.0000 mg | Freq: Once | INTRAVENOUS | Status: DC | PRN
  Administered 2020-04-28: 1000 mg via INTRAVENOUS

## 2020-04-28 MED ORDER — PHENYLEPHRINE HCL-NACL 10-0.9 MG/250ML-% IV SOLN
INTRAVENOUS | Status: DC | PRN
  Administered 2020-04-28: 40 ug/min via INTRAVENOUS

## 2020-04-28 MED ORDER — ACETAMINOPHEN 160 MG/5ML PO SOLN: 325.0000 mg | Freq: Once | ORAL | Status: DC | PRN

## 2020-04-28 MED ORDER — FUROSEMIDE 40 MG PO TABS
40.0000 mg | ORAL_TABLET | Freq: Two times a day (BID) | ORAL | Status: DC
  Administered 2020-04-28 – 2020-05-02 (×8): 40 mg via ORAL
  Filled 2020-04-28 (×3): qty 1
  Filled 2020-04-28: qty 2
  Filled 2020-04-28 (×5): qty 1

## 2020-04-28 MED ORDER — FENTANYL CITRATE (PF) 250 MCG/5ML IJ SOLN
INTRAMUSCULAR | Status: DC | PRN
  Administered 2020-04-28 (×3): 25 ug via INTRAVENOUS

## 2020-04-28 MED ORDER — LIDOCAINE 2% (20 MG/ML) 5 ML SYRINGE
INTRAMUSCULAR | Status: AC
  Filled 2020-04-28: qty 5

## 2020-04-28 MED ORDER — FENTANYL CITRATE (PF) 100 MCG/2ML IJ SOLN: 25.0000 ug | INTRAMUSCULAR | Status: DC | PRN

## 2020-04-28 SURGICAL SUPPLY — 67 items
BANDAGE ESMARK 6X9 LF (GAUZE/BANDAGES/DRESSINGS) IMPLANT
BLADE SAGITTAL (BLADE)
BLADE SAGITTAL 25.0X1.19X90 (BLADE) IMPLANT
BLADE SAGITTAL 25.0X1.19X90MM (BLADE)
BLADE SAW GIGLI 510 (BLADE) ×2 IMPLANT
BLADE SAW GIGLI 510MM (BLADE) ×1
BLADE SAW THK.89X75X18XSGTL (BLADE) IMPLANT
BNDG COHESIVE 6X5 TAN STRL LF (GAUZE/BANDAGES/DRESSINGS) ×3 IMPLANT
BNDG ELASTIC 4X5.8 VLCR STR LF (GAUZE/BANDAGES/DRESSINGS) IMPLANT
BNDG ELASTIC 6X5.8 VLCR STR LF (GAUZE/BANDAGES/DRESSINGS) ×3 IMPLANT
BNDG ESMARK 6X9 LF (GAUZE/BANDAGES/DRESSINGS)
BNDG GAUZE ELAST 4 BULKY (GAUZE/BANDAGES/DRESSINGS) ×3 IMPLANT
CANISTER SUCT 3000ML PPV (MISCELLANEOUS) ×3 IMPLANT
CHLORAPREP W/TINT 26 (MISCELLANEOUS) ×3 IMPLANT
CLIP VESOCCLUDE MED 6/CT (CLIP) ×3 IMPLANT
COVER SURGICAL LIGHT HANDLE (MISCELLANEOUS) ×3 IMPLANT
COVER WAND RF STERILE (DRAPES) IMPLANT
CUFF TOURN SGL QUICK 24 (TOURNIQUET CUFF)
CUFF TOURN SGL QUICK 34 (TOURNIQUET CUFF)
CUFF TRNQT CYL 24X4X16.5-23 (TOURNIQUET CUFF) IMPLANT
CUFF TRNQT CYL 34X4.125X (TOURNIQUET CUFF) IMPLANT
DRAIN CHANNEL 19F RND (DRAIN) IMPLANT
DRAPE HALF SHEET 40X57 (DRAPES) ×6 IMPLANT
DRAPE ORTHO SPLIT 77X108 STRL (DRAPES) ×4
DRAPE SURG ORHT 6 SPLT 77X108 (DRAPES) ×2 IMPLANT
DRSG ADAPTIC 3X8 NADH LF (GAUZE/BANDAGES/DRESSINGS) ×3 IMPLANT
DRSG PAD ABDOMINAL 8X10 ST (GAUZE/BANDAGES/DRESSINGS) ×3 IMPLANT
ELECT CAUTERY BLADE 6.4 (BLADE) ×3 IMPLANT
ELECT REM PT RETURN 9FT ADLT (ELECTROSURGICAL) ×3
ELECTRODE REM PT RTRN 9FT ADLT (ELECTROSURGICAL) ×1 IMPLANT
EVACUATOR SILICONE 100CC (DRAIN) IMPLANT
GAUZE SPONGE 4X4 12PLY STRL (GAUZE/BANDAGES/DRESSINGS) ×3 IMPLANT
GAUZE SPONGE 4X4 12PLY STRL LF (GAUZE/BANDAGES/DRESSINGS) ×3 IMPLANT
GAUZE XEROFORM 5X9 LF (GAUZE/BANDAGES/DRESSINGS) ×3 IMPLANT
GLOVE BIO SURGEON STRL SZ7.5 (GLOVE) ×6 IMPLANT
GLOVE BIOGEL PI IND STRL 7.5 (GLOVE) ×2 IMPLANT
GLOVE BIOGEL PI INDICATOR 7.5 (GLOVE) ×4
GLOVE SS BIOGEL STRL SZ 6.5 (GLOVE) ×1 IMPLANT
GLOVE SUPERSENSE BIOGEL SZ 6.5 (GLOVE) ×2
GLOVE SURG SS PI 8.0 STRL IVOR (GLOVE) ×3 IMPLANT
GOWN STRL REUS W/ TWL LRG LVL3 (GOWN DISPOSABLE) ×2 IMPLANT
GOWN STRL REUS W/ TWL XL LVL3 (GOWN DISPOSABLE) ×1 IMPLANT
GOWN STRL REUS W/TWL LRG LVL3 (GOWN DISPOSABLE) ×4
GOWN STRL REUS W/TWL XL LVL3 (GOWN DISPOSABLE) ×2
KIT BASIN OR (CUSTOM PROCEDURE TRAY) ×3 IMPLANT
KIT TURNOVER KIT B (KITS) ×3 IMPLANT
NS IRRIG 1000ML POUR BTL (IV SOLUTION) ×3 IMPLANT
PACK GENERAL/GYN (CUSTOM PROCEDURE TRAY) ×3 IMPLANT
PAD ARMBOARD 7.5X6 YLW CONV (MISCELLANEOUS) ×6 IMPLANT
SPONGE LAP 18X18 RF (DISPOSABLE) ×3 IMPLANT
STAPLER VISISTAT 35W (STAPLE) ×3 IMPLANT
STOCKINETTE IMPERVIOUS LG (DRAPES) ×3 IMPLANT
SUT ETHILON 3 0 PS 1 (SUTURE) ×6 IMPLANT
SUT PROLENE 2 0 MH 48 (SUTURE) ×6 IMPLANT
SUT SILK 0 TIES 10X30 (SUTURE) IMPLANT
SUT SILK 2 0 (SUTURE) ×2
SUT SILK 2 0 SH CR/8 (SUTURE) ×3 IMPLANT
SUT SILK 2-0 18XBRD TIE 12 (SUTURE) ×1 IMPLANT
SUT SILK 3 0 (SUTURE)
SUT SILK 3-0 18XBRD TIE 12 (SUTURE) IMPLANT
SUT VIC AB 2-0 CT1 18 (SUTURE) ×6 IMPLANT
SUT VIC AB 2-0 SH 18 (SUTURE) ×6 IMPLANT
SYR BULB IRRIG 60ML STRL (SYRINGE) ×3 IMPLANT
TAPE UMBILICAL COTTON 1/8X30 (MISCELLANEOUS) ×3 IMPLANT
TOWEL GREEN STERILE (TOWEL DISPOSABLE) ×6 IMPLANT
UNDERPAD 30X36 HEAVY ABSORB (UNDERPADS AND DIAPERS) ×3 IMPLANT
WATER STERILE IRR 1000ML POUR (IV SOLUTION) ×3 IMPLANT

## 2020-04-28 NOTE — Progress Notes (Signed)
VASCULAR SURGERY:  Her platelet count has continued to improve and is up to 82,000 this morning.  She had 2 units of platelets ordered for surgery today.  I will discuss with Dr. Stanford Breed, but hopefully we will be able to proceed with right AKA today.  Deitra Mayo, MD Office: [phone]

## 2020-04-28 NOTE — Anesthesia Preprocedure Evaluation (Signed)
Anesthesia Evaluation  Patient identified by MRN, date of birth, ID band Patient awake    Reviewed: Allergy & Precautions, NPO status , Patient's Chart, lab work & pertinent test results  Airway Mallampati: III  TM Distance: >3 FB Neck ROM: Full    Dental  (+) Teeth Intact, Dental Advisory Given   Pulmonary former smoker,    breath sounds clear to auscultation       Cardiovascular  Rhythm:Regular Rate:Normal     Neuro/Psych negative neurological ROS     GI/Hepatic negative GI ROS, Neg liver ROS,   Endo/Other  Hypothyroidism   Renal/GU Renal InsufficiencyRenal disease     Musculoskeletal negative musculoskeletal ROS (+)   Abdominal Normal abdominal exam  (+)   Peds  Hematology negative hematology ROS (+)   Anesthesia Other Findings   Reproductive/Obstetrics negative OB ROS                            Anesthesia Physical Anesthesia Plan  ASA: III  Anesthesia Plan: General   Post-op Pain Management:    Induction: Intravenous  PONV Risk Score and Plan: 4 or greater and Ondansetron and Treatment may vary due to age or medical condition  Airway Management Planned: LMA  Additional Equipment: None  Intra-op Plan:   Post-operative Plan: Extubation in OR  Informed Consent: I have reviewed the patients History and Physical, chart, labs and discussed the procedure including the risks, benefits and alternatives for the proposed anesthesia with the patient or authorized representative who has indicated his/her understanding and acceptance.   Patient has DNR.  Discussed DNR with patient and Suspend DNR.   Dental advisory given  Plan Discussed with: CRNA  Anesthesia Plan Comments: (Echo:  1. Left ventricular ejection fraction, by estimation, is 55 to 60%. The  left ventricle has normal function. Left ventricular endocardial border  not optimally defined to evaluate regional wall  motion. Left ventricular  diastolic parameters are consistent  with Grade II diastolic dysfunction (pseudonormalization). Elevated left  ventricular end-diastolic pressure.  2. Right ventricular systolic function is normal. The right ventricular  size is normal. There is normal pulmonary artery systolic pressure. The  estimated right ventricular systolic pressure is 10.0 mmHg.  3. The mitral valve is grossly normal. No evidence of mitral valve  regurgitation. No evidence of mitral stenosis.  4. The aortic valve is abnormal. There is moderate calcification of the  aortic valve. Aortic valve regurgitation is not visualized. Moderate  aortic valve sclerosis/calcification is present, without any evidence of  aortic stenosis.  5. The inferior vena cava is normal in size with <50% respiratory  variability, suggesting right atrial pressure of 8 mmHg. )       Anesthesia Quick Evaluation

## 2020-04-28 NOTE — Progress Notes (Signed)
   VASCULAR SURGERY ASSESSMENT & PLAN:   NONSALVAGEABLE RIGHT LOWER EXTREMITY: Plan on right above knee amputation today in OR.  THROMBOCYTOPENIA: Her platelet count continues to improve. No need for transfusion.  I explained risks/benefits/alternatives to the patient. She is understanding and wishes to proceed.  SUBJECTIVE:   No specific complaints.  PHYSICAL EXAM:   Vitals:   04/27/20 0857 04/27/20 1621 04/27/20 2034 04/28/20 0234  BP: 130/71 (!) 110/56 134/72 128/62  Pulse: 72 85 87 71  Resp: 16 16 17 15   Temp: 98.5 F (36.9 C) 97.7 F (36.5 C) 97.9 F (36.6 C) (!) 97.5 F (36.4 C)  TempSrc: Oral Oral Oral Oral  SpO2: 100% 93% 94% 95%  Weight:      Height:       Nonsalvageable right lower extremity. Brisk dorsalis pedis and posterior tibial signal on the left.  LABS:   Lab Results  Component Value Date   WBC 9.2 04/28/2020   HGB 7.1 (L) 04/28/2020   HCT 23.7 (L) 04/28/2020   MCV 92.9 04/28/2020   PLT 82 (L) 04/28/2020   Lab Results  Component Value Date   CREATININE 1.06 (H) 04/28/2020   Lab Results  Component Value Date   INR 1.6 (H) 04/14/2020   CBG (last 3)  Recent Labs    04/25/20 1945 04/26/20 0415 04/26/20 1643  GLUCAP 91 87 50*    PROBLEM LIST:    Principal Problem:   Septic shock ([place]) Active Problems:   Dehydration   Shock ([place])   Pressure injury of skin   Pulmonary infiltrates on CXR   Altered mental status   Thrombocytopenia ([facility])   Protein-calorie malnutrition, severe   CURRENT MEDS:   . [MAR Hold] sodium chloride   Intravenous Once  . [MAR Hold] sodium chloride   Intravenous Once  . [MAR Hold] sodium chloride   Intravenous Once  . [MAR Hold] Chlorhexidine Gluconate Cloth  6 each Topical Daily  . [MAR Hold] feeding supplement  237 mL Oral BID BM  . [MAR Hold] furosemide  40 mg Intravenous BID  . [MAR Hold] guaiFENesin  1,200 mg Oral BID  . [MAR Hold] levothyroxine  112 mcg Oral Q0600  . [MAR Hold] mouth rinse  15 mL  Mouth Rinse BID  . [MAR Hold] multivitamin with minerals  1 tablet Oral Daily  . [MAR Hold] potassium chloride  40 mEq Oral BID  . [MAR Hold] sodium chloride flush  10-40 mL Intracatheter Q12H    Ryzen Deady N. Stanford Breed, MD Office: [phone] 04/28/2020

## 2020-04-28 NOTE — Progress Notes (Signed)
°   04/28/20 1700  Clinical Encounter Type  Visited With Patient and family together  Visit Type Spiritual support;Other (Comment)  Referral From Nurse  Consult/Referral To Chaplain  Spiritual Encounters  Spiritual Needs Other (Comment)  Stress Factors  Patient Stress Factors None identified  Family Stress Factors Health changes  Chaplain visited with Patient and daughter. Daughter requested AD info. Chaplain will provide.

## 2020-04-28 NOTE — Progress Notes (Signed)
VAST RN Amber responded for 0800 DBIV. Pt out of room. Per RN pt to be back in room for 1200 DBIV.

## 2020-04-28 NOTE — Progress Notes (Signed)
PT Cancellation Note  Patient Details Name: DESTENI PISCOPO MRN: [id number] DOB: 04-11-1946   Cancelled Treatment:    Reason Eval/Treat Not Completed: Patient at procedure or test/unavailable   Currently in OR;   Will follow up later today as time allows;  Otherwise, will follow up for PT tomorrow;   Thank you,  Roney Marion, PT  Acute Rehabilitation Services Pager [phone] Office [place] 04/28/2020, 9:15 AM

## 2020-04-28 NOTE — Progress Notes (Signed)
PROGRESS NOTE    Michelle Avila  ALP:[id number] DOB: [birth year] DOA: 04/12/2020 PCP: Venetia Maxon, Sharon Mt, MD   Brief Narrative:  The patient is a 74 year old female with PMH for hypothyroidism who presented with AMS, hypoglycemia, dehydration. From report neighbors had not seen or heard from her in several days and daughter came in from out of state and found the patient on the floor in feces and was very altered. EMS was dispatched and upon evaluation her blood sugar was 19 and so they gave her D10. She was brought to the ED and found to be dehydrated and given 2-1/2 L of IV fluids. CT head was obtained without acute changes. Blood and urine cultures were ordered but the ED felt the picture was more consistent with dehydration and hypoglycemia rather than infection so she was not started on antibiotics at that time. Her mentation largely improved in the ED and she endorsed a recent med change and was placed on a diuretic for her bilateral lower extremity edema.  Further work-up revealed that she ended up going into septic shock and hypovolemic shock from a gram-negative bacteremia likely from being in her feces and possible colitis. She was started on IV Zosyn briefly then changed to Levaquin and then to ciprofloxacin. She was placed on pressors and had encephalopathy and electrolyte derangements. They were adjusted and corrected and she was given albumin and stabilized in the ICU.  She had some dysphagia so she was n.p.o. and SLP evaluated and they are now recommending a dysphagia 2 diet with nectar thick liquids.  She is transferred to [facility] service 04/18/2020. During her hospitalization she had critical illness thrombocytopenia and anemia. She gets transfusions as needed while bone marrow recovers from sepsis.   Patient's platelet count continues to drop-she received multiple platelet transfusion and 1 unit PRBC transfusion.  Palliative care was consulted for goals of care  discussion.  Thrombocytopenia was also discussed with Dr. Alen Blew who feels that this is secondary to her septic shock and her bacteremia and recommend supportive care at this time and transfusions.   Due to her ongoing right foot wound/mummification, a consult with ortho and vascular surgery was pursued. She will likely need a right AKA and intense wound care with her LLE therefore she transferred to [facility] on 10/28..   Assessment & Plan:   Nonsalvageable right lower extremity: -S/p right AKA, POD 0 by vascular surgery-Dr. Stanford Breed -Oxycodone/Dilaudid as needed for pain control. 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 -PT OT evaluation done and recommending SNF  left leg wounds -Patient also has extensive excoriations and wounds noted on left lower extremity.  -Wound care  Acute Encephalopathy: Resolved -Likely multifactorial including her severe hypoglycemia, dehydration, septic shock from colitis and bacteremia -CT head negative for acute findings. -Continue delirium precautions  Volume overload : Improving  -in the setting of volume resuscitation as well as acute on chronic diastolic CHF -Echocardiogram showed a normal EF but grade 2 diastolic dysfunction -BNP was elevated at 1,113.0 and improved to 437.2 -Strict I's and O's and daily weights -Continue Lasix  Acute  Respiratory Failure with Hypoxia: Improving -Continue diuresis.  Patient finished Levaquin for 14 days. -continue pulmonary toileting, guaifenesin  1200 mg p.o. twice daily, flutter valve, incentive spirometer, Xopenex and Atrovent breathing treatments -On room air.  Dysphagia -SLP evaluated and recommended dysphagia 2 nectar thick diet with allowing teaspoon of thins   Severe Protein Calorie Malnutrition/Failure to Thrive -Continue on a dysphagia 2 diet  Hypothyroidism -Patient's TSH was 9.921 and free T4 0.67 -Continue levothyroxine  Abnormal LFTs, improving -Reviewed liver Doppler done on 10/18  -Continue monitor and trend and repeat CMP in a.m.  Thrombocytopenia -In the Setting of Septic Shock and her gram-negative bacteremia. oncology feels that this is secondary to her infection and not related to HUS TTP or ITP -s/p 5 units PLT for admission -continue holding chemical DVT ppx -Platelet count: Improving 82 this morning.  Hypomagnesemia: Resolved  Normocytic anemia/anemia of critical illness - baseline roughtly 8-9 trended down to 7.1 today. -Monitor H&H closely and transfuse as needed  DVT prophylaxis: SCD  code Status: DNR Family Communication: Patient's daughter present at bedside.  Plan of care discussed with patient in length and she verbalized understanding and agreed with it.  Disposition Plan: SNF/inpatient rehab  Consultants:   PCCM  Vascular  Orthopedic  Palliative care  Procedures:   Echo  Right AKA  Antimicrobials:   As above  Status is: Inpatient  Dispo: The patient is from: Home              Anticipated d/c is to: SNF              Anticipated d/c date is: 05/02/2020              Patient currently not medically stable for the discharge  Subjective: Patient seen and examined.  Daughter at the bedside.  Tolerated surgery very well.  Appears weak and pale however smiling and tells me that " surgery went well and I am doing good".  Objective: Vitals:   04/28/20 1100 04/28/20 1110 04/28/20 1139 04/28/20 1212  BP: (!) 110/55 (!) 126/52 123/63 (!) 121/52  Pulse: 65 66 64 (!) 56   Resp: (!) 22 (!) 22 18   Temp:  97.7 F (36.5 C) 97.8 F (36.6 C)   TempSrc:   Oral   SpO2: 95% 94% 96%   Weight:      Height:        Intake/Output Summary (Last 24 hours) at 04/28/2020 1343 Last data filed at 04/28/2020 1007 Gross per 24 hour  Intake 750 ml  Output 1150 ml  Net -400 ml   Filed Weights   04/20/20 0500 04/22/20 0500 04/23/20 0500  Weight: 54 kg 51.2 kg 50.9 kg    Examination: General exam: Appears calm and comfortable, on nasal cannula, appears pale, weak and lethargic.   Respiratory system: Clear to auscultation. Respiratory effort normal. Cardiovascular system: S1 & S2 heard, RRR. No JVD, murmurs, rubs, gallops or clicks. No pedal edema. Gastrointestinal system: Abdomen is nondistended, soft and nontender. No organomegaly or masses felt. Normal bowel sounds heard. Central nervous system: Alert and oriented. No focal neurological deficits. Extremities: Right AKA, dressing dry and intact.  Left leg: In [employer] boots  psychiatry: Judgement and insight appear normal. Mood & affect appropriate.   Data Reviewed: I have personally reviewed following labs and imaging studies  CBC: Recent Labs  Lab 04/23/20 0330 04/23/20 0330 04/24/20 0402 04/25/20 0415 04/26/20 0413 04/27/20 0423 04/28/20 0346  WBC 12.0*   < > 11.2*  10.2 10.2 10.6* 9.2  NEUTROABS 10.6*  --  9.6* 8.6*  --  9.2* 7.8*  HGB 7.6*   < > 7.8* 7.5* 7.3* 8.0* 7.1*  HCT 24.7*   < > 24.8* 24.6* 24.3* 26.8* 23.7*  MCV 88.2   < > 89.9 89.8 93.1 89.9 92.9  PLT 19*   < > 19* 49* 46* 63* 82*   < > = values in this interval not displayed.   Basic Metabolic Panel: Recent Labs  Lab 04/23/20 0330 04/23/20 0330 04/24/20 0402 04/24/20 0402 04/25/20 0415 04/25/20 2356 04/26/20 1100 04/26/20 1541 04/27/20 0423 04/27/20 0825 04/27/20 1600 04/27/20 2012 04/28/20 0008 04/28/20 0346 04/28/20 1224  NA 147*   < > 148*  --  143  --  142  --  139  --   --   --   --  139  --   K 3.3*   < > 4.5  --  4.1   --  4.8  --  4.5  --   --   --   --  4.1  --   CL 105   < > 104  --  99  --  100  --  99  --   --   --   --  99  --   CO2 34*   < > 35*  --  33*  --  32  --  33*  --   --   --   --  33*  --   GLUCOSE 87   < > 83   < > 81   < > 98   < > 84   < > 80 76 76 77 84  BUN 30*   < > 22  --  21  --  14  --  11  --   --   --   --  14  --   CREATININE 0.98   < > 0.90  --  0.92  --  0.94  --  0.96  --   --   --   --  1.06*  --   CALCIUM 7.2*   < > 7.5*  --  7.3*  --  7.8*  --  7.8*  --   --   --   --  7.6*  --   MG 1.8   < > 1.6*  --  1.4*  --  1.4*  --  2.4  --   --   --   --  2.4  --   PHOS 3.3  --  3.0  --  3.3  --   --   --  3.8  --   --   --   --  4.5  --    < > = values in this interval not displayed.   GFR: Estimated Creatinine Clearance: 36.8 mL/min (A) (by C-G formula based on SCr of 1.06 mg/dL (H)). Liver Function Tests: Recent Labs  Lab 04/24/20 0402 04/25/20 0415 04/26/20 1100 04/27/20 0423 04/28/20 0346  AST 52* 37 34 27 23  ALT 40 38 81* 32 26  ALKPHOS 371* 329* 298* 269* 217*  BILITOT 1.0 0.8 0.8 0.9 1.0  PROT 4.9* 5.0* 5.4* 5.2* 4.7*  ALBUMIN 1.8* 1.9* 1.8* 1.7* 1.5*   No results for input(s): LIPASE, AMYLASE in the last 168 hours. No results for input(s): AMMONIA in the last 168 hours. Coagulation Profile: No results for input(s): INR, PROTIME in the last 168 hours. Cardiac Enzymes:  No results for input(s): CKTOTAL, CKMB, CKMBINDEX, TROPONINI in the last 168 hours. BNP (last 3 results) No results for input(s): PROBNP in the last 8760 hours. HbA1C: No results for input(s): HGBA1C in the last 72 hours. CBG: Recent Labs  Lab 04/25/20 1945 04/26/20 0415 04/26/20 1643 04/28/20 0943 04/28/20 1024  GLUCAP 91 87 50* 67* 124*   Lipid Profile: No results for input(s): CHOL, HDL, LDLCALC, TRIG, CHOLHDL, LDLDIRECT in the last 72 hours. Thyroid Function Tests: No results for input(s): TSH, T4TOTAL, FREET4, T3FREE, THYROIDAB in the last 72 hours. Anemia Panel: No results  for input(s): VITAMINB12, FOLATE, FERRITIN, TIBC, IRON, RETICCTPCT in the last 72 hours. Sepsis Labs: No results for input(s): PROCALCITON, LATICACIDVEN in the last 168 hours.  No results found for this or any previous visit (from the past 240 hour(s)).    Radiology Studies: No results found.  Scheduled Meds: . sodium chloride   Intravenous Once  . sodium chloride   Intravenous Once  . sodium chloride   Intravenous Once  . chlorhexidine      . Chlorhexidine Gluconate Cloth  6 each Topical Daily  . feeding supplement  237 mL Oral BID BM  . furosemide  40 mg Intravenous BID  . guaiFENesin  1,200 mg Oral BID  . levothyroxine  112 mcg Oral Q0600  . mouth rinse  15 mL Mouth Rinse BID  . multivitamin with minerals  1 tablet Oral Daily  . potassium chloride  40 mEq Oral BID  . sodium chloride flush  10-40 mL Intracatheter Q12H   Continuous Infusions: . sodium chloride 250 mL (04/24/20 1835)  . acetaminophen Stopped (04/28/20 1149)     LOS: 15 days   Time spent: 40 minutes   Shterna Laramee Loann Quill, MD [facility]  If 7PM-7AM, please contact night-coverage [email or web address] 04/28/2020, 1:43 PM

## 2020-04-28 NOTE — Transfer of Care (Signed)
Immediate Anesthesia Transfer of Care Note  Patient: Michelle Avila  Procedure(s) Performed: AMPUTATION ABOVE KNEE (Right Knee)  Patient Location: PACU  Anesthesia Type:General  Level of Consciousness: drowsy  Airway & Oxygen Therapy: Patient Spontanous Breathing and Patient connected to face mask oxygen  Post-op Assessment: Report given to RN and Post -op Vital signs reviewed and stable  Post vital signs: Reviewed  Last Vitals:  Vitals Value Taken Time  BP    Temp    Pulse    Resp    SpO2      Last Pain:  Vitals:   04/28/20 0751  TempSrc:   PainSc: 0-No pain      Patients Stated Pain Goal: 0 ([birth year] 8099)  Complications: No complications documented.

## 2020-04-28 NOTE — Progress Notes (Signed)
SLP Cancellation Note  Patient Details Name: Michelle Avila MRN: [id number] DOB: Jul 29, 1945   Cancelled treatment:       Reason Eval/Treat Not Completed: Patient at procedure or test/unavailable.  Pt is currently in surgery.  SLP will f/u as appropriate.     Elvia Collum Prezley Qadir 04/28/2020, 8:53 AM

## 2020-04-28 NOTE — Plan of Care (Signed)
  Problem: Education: Goal: Knowledge of General Education information will improve Description: Including pain rating scale, medication(s)/side effects and non-pharmacologic comfort measures Outcome: Progressing   Problem: Health Behavior/Discharge Planning: Goal: Ability to manage health-related needs will improve Outcome: Progressing   Problem: Clinical Measurements: Goal: Will remain free from infection Outcome: Progressing   

## 2020-04-28 NOTE — Anesthesia Postprocedure Evaluation (Signed)
Anesthesia Post Note  Patient: Michelle Avila  Procedure(s) Performed: AMPUTATION ABOVE KNEE (Right Knee)     Patient location during evaluation: PACU Anesthesia Type: General Level of consciousness: awake and alert Pain management: pain level controlled Vital Signs Assessment: post-procedure vital signs reviewed and stable Respiratory status: spontaneous breathing, nonlabored ventilation, respiratory function stable and patient connected to nasal cannula oxygen Cardiovascular status: blood pressure returned to baseline and stable Postop Assessment: no apparent nausea or vomiting Anesthetic complications: no   No complications documented.  Last Vitals:  Vitals:   04/28/20 1110 04/28/20 1139  BP: (!) 126/52 123/63  Pulse: 66 64  Resp: (!) 22 18  Temp: 36.5 C 36.6 C  SpO2: 94% 96%    Last Pain:  Vitals:   04/28/20 1139  TempSrc: Oral  PainSc:                  Effie Berkshire

## 2020-04-28 NOTE — Op Note (Signed)
.  04/12/2020 - 04/28/2020  10:00 AM  PATIENT:  Michelle Avila  74 y.o. female  PRE-OPERATIVE DIAGNOSIS:  ISCHEMIC RIGHT LEG  POST-OPERATIVE DIAGNOSIS:  ISCHEMIC RIGHT LEG  PROCEDURE:  Procedure(s): AMPUTATION ABOVE KNEE (Right)  SURGEON:  Surgeon(s) and Role:    * Cherre Robins, MD - Primary  PHYSICIAN ASSISTANT: Risa Grill, PA-C  ANESTHESIA:   general  EBL:  100 mL   BLOOD ADMINISTERED:none  DRAINS: none   LOCAL MEDICATIONS USED:  NONE  SPECIMEN:  Residual right limb to morgue  DISPOSITION OF SPECIMEN:  N/A  COUNTS: correct  TOURNIQUET:  Not used  DICTATION: .Dragon Dictation  PLAN OF CARE: return to nursing unit. Dressing down POD#2. Disposition to inpatient rehab once medically stabilized and pain well controlled.  PATIENT DISPOSITION:  PACU - hemodynamically stable.   Delay start of Pharmacological VTE agent (>24hrs) due to surgical blood loss or risk of bleeding: no  INDICATIONS FOR PROCEDURE: SCHUYLER BEHAN is a 74 y.o. female with a ischemic and nonsalvageable right lower extremity with profound ulceration from the toes to the calf.  She was offered palliative right above-knee amputation.  All risks, benefits, and alternatives were discussed with the patient and family.  They understood these and wished to proceed.  DESCRIPTION OF PROCEDURE: After identifying the patient in the preoperative holding area, the patient was transferred to the operating room and placed supine on the operating room table anesthesia was induced.  Right lower extremity was circumferentially prepped from the toes to the waist in standard fashion; drapes were placed.  Surgical pause was performed confirming correct patient, location, procedure.  A generous fishmouth incision was made in the right mid thigh.  This was carried down medially with Bovie electrocautery until the femoral neurovascular bundle was identified, skeletonized, and ligated using 3-0 Prolene.  The  remainder of the musculature and soft tissue of the leg was divided with Bovie electrocautery the femur was divided using a Gigli saw.  Specimen was passed off the table.  The wound was copiously irrigated and hemostasis was achieved.  Fascia was closed with 2-0 Vicryl.  Deep dermal skin was closed with 3-0 Vicryl.  Skin was closed with 3-0 nylon in vertical mattress and staples.  Sterile dressing was applied.  Yevonne Aline. Stanford Breed, MD 04/28/2020

## 2020-04-28 NOTE — Anesthesia Procedure Notes (Signed)
Procedure Name: LMA Insertion Date/Time: 04/28/2020 8:43 AM Performed by: Janene Harvey, CRNA Pre-anesthesia Checklist: Patient identified, Emergency Drugs available, Suction available and Patient being monitored Patient Re-evaluated:Patient Re-evaluated prior to induction Oxygen Delivery Method: Circle system utilized Preoxygenation: Pre-oxygenation with 100% oxygen Induction Type: IV induction LMA: LMA inserted LMA Size: 3.0 Grade View: Grade I Placement Confirmation: positive ETCO2 and breath sounds checked- equal and bilateral Dental Injury: Teeth and Oropharynx as per pre-operative assessment

## 2020-04-29 ENCOUNTER — Encounter (HOSPITAL_COMMUNITY): Payer: Self-pay | Admitting: Vascular Surgery

## 2020-04-29 DIAGNOSIS — A419 Sepsis, unspecified organism: Secondary | ICD-10-CM | POA: Diagnosis not present

## 2020-04-29 DIAGNOSIS — R6521 Severe sepsis with septic shock: Secondary | ICD-10-CM | POA: Diagnosis not present

## 2020-04-29 DIAGNOSIS — R579 Shock, unspecified: Secondary | ICD-10-CM | POA: Diagnosis not present

## 2020-04-29 DIAGNOSIS — R4182 Altered mental status, unspecified: Secondary | ICD-10-CM | POA: Diagnosis not present

## 2020-04-29 DIAGNOSIS — Z7189 Other specified counseling: Secondary | ICD-10-CM | POA: Diagnosis not present

## 2020-04-29 LAB — CBC WITH DIFFERENTIAL/PLATELET
Abs Immature Granulocytes: 0.07 10*3/uL (ref 0.00–0.07)
Basophils Absolute: 0.1 10*3/uL (ref 0.0–0.1)
Basophils Relative: 1 %
Eosinophils Absolute: 0 10*3/uL (ref 0.0–0.5)
Eosinophils Relative: 0 %
HCT: 21.4 % — ABNORMAL LOW (ref 36.0–46.0)
Hemoglobin: 6.2 g/dL — CL (ref 12.0–15.0)
Immature Granulocytes: 1 %
Lymphocytes Relative: 5 %
Lymphs Abs: 0.5 10*3/uL — ABNORMAL LOW (ref 0.7–4.0)
MCH: 27.9 pg (ref 26.0–34.0)
MCHC: 29 g/dL — ABNORMAL LOW (ref 30.0–36.0)
MCV: 96.4 fL (ref 80.0–100.0)
Monocytes Absolute: 0.8 10*3/uL (ref 0.1–1.0)
Monocytes Relative: 8 %
Neutro Abs: 8.8 10*3/uL — ABNORMAL HIGH (ref 1.7–7.7)
Neutrophils Relative %: 85 %
Platelets: 119 10*3/uL — ABNORMAL LOW (ref 150–400)
RBC: 2.22 MIL/uL — ABNORMAL LOW (ref 3.87–5.11)
RDW: 26.7 % — ABNORMAL HIGH (ref 11.5–15.5)
WBC: 10.2 10*3/uL (ref 4.0–10.5)
nRBC: 0 % (ref 0.0–0.2)

## 2020-04-29 LAB — PREPARE RBC (CROSSMATCH)

## 2020-04-29 LAB — CBC
HCT: 25.1 % — ABNORMAL LOW (ref 36.0–46.0)
Hemoglobin: 7.9 g/dL — ABNORMAL LOW (ref 12.0–15.0)
MCH: 28.4 pg (ref 26.0–34.0)
MCHC: 31.5 g/dL (ref 30.0–36.0)
MCV: 90.3 fL (ref 80.0–100.0)
Platelets: 136 10*3/uL — ABNORMAL LOW (ref 150–400)
RBC: 2.78 MIL/uL — ABNORMAL LOW (ref 3.87–5.11)
RDW: 23.6 % — ABNORMAL HIGH (ref 11.5–15.5)
WBC: 11 10*3/uL — ABNORMAL HIGH (ref 4.0–10.5)
nRBC: 0 % (ref 0.0–0.2)

## 2020-04-29 LAB — GLUCOSE, RANDOM
Glucose, Bld: 66 mg/dL — ABNORMAL LOW (ref 70–99)
Glucose, Bld: 165 mg/dL — ABNORMAL HIGH (ref 70–99)
Glucose, Bld: 102 mg/dL — ABNORMAL HIGH (ref 70–99)
Glucose, Bld: 84 mg/dL (ref 70–99)
Glucose, Bld: 265 mg/dL — ABNORMAL HIGH (ref 70–99)

## 2020-04-29 MED ORDER — SODIUM CHLORIDE 0.9% IV SOLUTION: Freq: Once | INTRAVENOUS | Status: DC

## 2020-04-29 MED ORDER — DEXTROSE-NACL 5-0.45 % IV SOLN: INTRAVENOUS | Status: AC

## 2020-04-29 MED ORDER — DEXTROSE 50 % IV SOLN
INTRAVENOUS | Status: AC
  Filled 2020-04-29: qty 50

## 2020-04-29 NOTE — Progress Notes (Signed)
Patient refuse to eat or drink, patient given nutritional drink and encouraged to finish cup.patient educated on hypoglycemia and outcomes of malnurition

## 2020-04-29 NOTE — Progress Notes (Signed)
Patient given 81ml of Dextrose injection. Blood glucose 66. Patient resting in bed quietly with no distress noted.lab at bedside to draw blood . Will continue to monitor

## 2020-04-29 NOTE — Progress Notes (Signed)
Secure chat sent to and paged via Amion - Dr Doristine Bosworth re [place] via PICC q4hrs for glucose.  No noted treatments documented via MAR for glucose results recently.  Requested to decrease frequency of PICC line access'.  Tu RN also notified via telephone.  No response obtained at this time by Dr Doristine Bosworth.  12noon glucose level obtained.

## 2020-04-29 NOTE — Plan of Care (Signed)
°  Problem: Education: Goal: Knowledge of General Education information will improve Description: Including pain rating scale, medication(s)/side effects and non-pharmacologic comfort measures Outcome: Progressing   Problem: Clinical Measurements: Goal: Ability to maintain clinical measurements within normal limits will improve Outcome: Progressing Goal: Will remain free from infection Outcome: Progressing Goal: Diagnostic test results will improve Outcome: Progressing Goal: Respiratory complications will improve Outcome: Progressing Goal: Cardiovascular complication will be avoided Outcome: Progressing   Problem: Safety: Goal: Ability to remain free from injury will improve Outcome: Progressing   Problem: Pain Managment: Goal: General experience of comfort will improve Outcome: Progressing   Problem: Skin Integrity: Goal: Risk for impaired skin integrity will decrease Outcome: Progressing

## 2020-04-29 NOTE — Progress Notes (Signed)
PROGRESS NOTE    Michelle Avila  IDP:[id number] DOB: 06-16-1946 DOA: 04/12/2020 PCP: Venetia Maxon, Sharon Mt, MD   Brief Narrative:  The patient is a 74 year old female with PMH for hypothyroidism who presented with AMS, hypoglycemia, dehydration. From report neighbors had not seen or heard from her in several days and daughter came in from out of state and found the patient on the floor in feces and was very altered. EMS was dispatched and upon evaluation her blood sugar was 19 and so they gave her D10. She was brought to the ED and found to be dehydrated and given 2-1/2 L of IV fluids. CT head was obtained without acute changes. Blood and urine cultures were ordered but the ED felt the picture was more consistent with dehydration and hypoglycemia rather than infection so she was not started on antibiotics at that time. Her mentation largely improved in the ED and she endorsed a recent med change and was placed on a diuretic for her bilateral lower extremity edema.  Further work-up revealed that she ended up going into septic shock and hypovolemic shock from a gram-negative bacteremia likely from being in her feces and possible colitis. She was started on IV Zosyn briefly then changed to Levaquin and then to ciprofloxacin. She was placed on pressors and had encephalopathy and electrolyte derangements. They were adjusted and corrected and she was given albumin and stabilized in the ICU.  She had some dysphagia so she was n.p.o. and SLP evaluated and they are now recommending a dysphagia 2 diet with nectar thick liquids.  She is transferred to [facility] service 04/18/2020. During her hospitalization she had critical illness thrombocytopenia and anemia. She gets transfusions as needed while bone marrow recovers from sepsis.   Patient's platelet count continues to drop-she received multiple platelet transfusion and 1 unit PRBC transfusion.  Palliative care was consulted for goals of care  discussion.  Thrombocytopenia was also discussed with Dr. Alen Blew who feels that this is secondary to her septic shock and her bacteremia and recommend supportive care at this time and transfusions.   Due to her ongoing right foot wound/mummification, a consult with ortho and vascular surgery was pursued. She will likely need a right AKA and intense wound care with her LLE therefore she transferred to [facility] on 10/28..   Assessment & Plan:   Nonsalvageable right lower extremity: -S/p right AKA, POD 1 by vascular surgery-Dr. Stanford Breed -Oxycodone/Dilaudid as needed for pain control. -Received 2 unit PRBC perioperatively -H&H this morning trended down from 7.1-6.2 this morning.  Status post 1 unit PRBC transfusion. -Monitor H&H and vitals closely -Plan is to change dressing tomorrow. -Appreciate vascular surgery help.  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 -PT OT evaluation done and recommending SNF  left leg wounds -Patient also has extensive excoriations and wounds noted on left lower extremity.  -Wound care  Acute Encephalopathy: Resolved -Likely multifactorial including her severe hypoglycemia, dehydration, septic shock from colitis and bacteremia -CT head negative for acute findings. -Continue delirium precautions  Volume overload : Improving  -in the setting of volume resuscitation as well as acute on chronic diastolic CHF -Echocardiogram showed a normal EF  but grade 2 diastolic dysfunction -BNP was elevated at 1,113.0 and improved to 437.2 -Strict I's and O's and daily weights -Continue  Lasix  Acute Respiratory Failure with Hypoxia: Improving -Continue diuresis.  Patient finished Levaquin for 14 days. -continue pulmonary toileting, guaifenesin 1200 mg p.o. twice daily, flutter valve, incentive spirometer, Xopenex and Atrovent breathing treatments  Dysphagia -SLP evaluated and recommended dysphagia 2 nectar thick diet with allowing teaspoon of thins   Severe Protein Calorie Malnutrition/Failure to Thrive -Continue on a dysphagia 2 diet  Hypothyroidism -Patient's TSH was 9.921 and free T4 0.67 -Continue levothyroxine  Abnormal LFTs, improving -Reviewed liver Doppler done on 10/18  -Continue monitor and trend and repeat CMP in a.m.  Thrombocytopenia -In the Setting of Septic Shock and her gram-negative bacteremia. oncology feels that this is secondary to her infection and not related to HUS TTP or ITP -s/p 5 units PLT for admission -continue holding chemical DVT ppx -Platelet count: Improving 119 this morning.  Hypomagnesemia: Resolved  Normocytic anemia/anemia of critical illness - baseline roughtly 8-9 trended down to 6.2 today after the procedure.  Status post 1 unit PRBC transfusion on 11/2. -Continue to monitor H&H closely  DVT prophylaxis: SCD  code Status: DNR Family Communication: None present at bedside.  Plan of care discussed with patient in length and she verbalized understanding and agreed with it.  I called patient's daughter [name] and discussed the plan of care and she verbalized understanding.  Disposition Plan: SNF/inpatient rehab  Consultants:   PCCM  Vascular  Orthopedic  Palliative care  Procedures:   Echo  Right AKA  Antimicrobials:   As above  Status is: Inpatient  Dispo: The patient is from: Home              Anticipated d/c is to: SNF              Anticipated d/c date is: 05/02/2020              Patient currently not medically stable for the discharge  Subjective: Patient seen and examined this morning.   Reports pain in stump and feeling weak.  Denies vomiting, shortness of breath, chest pain, abdominal pain, headache or blurry vision. Objective: Vitals:   04/29/20 0548 04/29/20 0632 04/29/20 0700 04/29/20 0819  BP: (!) 106/49 (!) 109/52 (!) 114/53 (!) 101/53  Pulse: 76 72 73 68  Resp: 18 18 18 17   Temp: 97.7 F (36.5 C) (!) 97.4 F (36.3 C) (!) 97.5 F (36.4 C) 97.6 F (36.4 C)  TempSrc: Oral Oral Oral Oral  SpO2:    97%  Weight:      Height:        Intake/Output Summary (Last 24 hours) at 04/29/2020 1300 Last data filed at 04/29/2020 0100 Gross per 24 hour  Intake --  Output 100 ml  Net -100 ml   Filed Weights   04/20/20 0500 04/22/20 0500 04/23/20 0500  Weight: 54 kg 51.2 kg 50.9 kg    Examination: General exam: Appears calm and comfortable, appears pale, weak and lethargic Respiratory system: Clear to auscultation. Respiratory effort normal. Cardiovascular system: S1 & S2 heard, RRR. No JVD, murmurs, rubs, gallops or clicks. No pedal edema. Gastrointestinal system: Abdomen is nondistended, soft and nontender. No organomegaly or masses felt. Normal bowel sounds heard. Central nervous system: Alert and oriented. No focal neurological deficits. Extremities: Right AKA, dressing dry intact.  Left foot: Dressing dry intact. Psychiatry: Judgement and insight appear normal. Mood & affect appropriate.   Data  Reviewed: I have personally reviewed following labs and imaging studies  CBC: Recent Labs  Lab 04/24/20 0402 04/24/20 0402 04/25/20 0415 04/26/20 0413 04/27/20 0423 04/28/20 0346 04/29/20 0400  WBC 11.2*   < > 10.2 10.2 10.6* 9.2 10.2  NEUTROABS 9.6*  --  8.6*  --  9.2* 7.8* 8.8*  HGB 7.8*   < > 7.5* 7.3* 8.0* 7.1* 6.2*  HCT 24.8*   < > 24.6* 24.3* 26.8* 23.7* 21.4*  MCV 89.9   < > 89.8 93.1 89.9 92.9 96.4  PLT 19*   < > 49* 46* 63* 82* 119*   < > = values in this interval not displayed.   Basic Metabolic Panel: Recent Labs  Lab 04/23/20 0330  04/23/20 0330 04/24/20 0402 04/24/20 0402 04/25/20 0415 04/25/20 2356 04/26/20 1100 04/26/20 1541 04/27/20 0423 04/27/20 0825 04/28/20 0346 04/28/20 1224 04/28/20 1956 04/29/20 0000 04/29/20 0400 04/29/20 0815 04/29/20 1200  NA 147*   < > 148*  --  143  --  142  --  139  --  139  --   --   --   --   --   --   K 3.3*   < > 4.5  --  4.1  --  4.8  --  4.5  --  4.1  --   --   --   --   --   --   CL 105   < > 104  --  99  --  100  --  99  --  99  --   --   --   --   --   --   CO2 34*   < > 35*  --  33*  --  32  --  33*  --  33*  --   --   --   --   --   --   GLUCOSE 87   < > 83   < > 81   < > 98   < > 84   < > 77   < > 71 66* 165* 102* 84  BUN 30*   < > 22  --  21  --  14  --  11  --  14  --   --   --   --   --   --   CREATININE 0.98   < > 0.90  --  0.92  --  0.94  --  0.96  --  1.06*  --   --   --   --   --   --   CALCIUM 7.2*   < > 7.5*  --  7.3*  --  7.8*  --  7.8*  --  7.6*  --   --   --   --   --   --   MG 1.8   < > 1.6*  --  1.4*  --  1.4*  --  2.4  --  2.4  --   --   --   --   --   --   PHOS 3.3  --  3.0  --  3.3  --   --   --  3.8  --  4.5  --   --   --   --   --   --    < > = values in this interval not displayed.   GFR: Estimated Creatinine Clearance: 36.8 mL/min (A) (by C-G formula based on SCr  of 1.06 mg/dL (H)). Liver Function Tests: Recent Labs  Lab 04/24/20 0402 04/25/20 0415 04/26/20 1100 04/27/20 0423 04/28/20 0346  AST 52* 37 34 27 23  ALT 40 38 81* 32 26  ALKPHOS 371* 329* 298* 269* 217*  BILITOT 1.0 0.8 0.8 0.9 1.0  PROT 4.9* 5.0* 5.4* 5.2* 4.7*  ALBUMIN 1.8* 1.9* 1.8* 1.7* 1.5*   No results for input(s): LIPASE, AMYLASE in the last 168 hours. No results for input(s): AMMONIA in the last 168 hours. Coagulation Profile: No results for input(s): INR, PROTIME in the last 168 hours. Cardiac Enzymes: No results for input(s): CKTOTAL, CKMB, CKMBINDEX, TROPONINI in the last 168 hours. BNP (last 3 results) No results for input(s): PROBNP in the last 8760  hours. HbA1C: No results for input(s): HGBA1C in the last 72 hours. CBG: Recent Labs  Lab 04/25/20 1945 04/26/20 0415 04/26/20 1643 04/28/20 0943 04/28/20 1024  GLUCAP 91 87 50* 67* 124*   Lipid Profile: No results for input(s): CHOL, HDL, LDLCALC, TRIG, CHOLHDL, LDLDIRECT in the last 72 hours. Thyroid Function Tests: No results for input(s): TSH, T4TOTAL, FREET4, T3FREE, THYROIDAB in the last 72 hours. Anemia Panel: No results for input(s): VITAMINB12, FOLATE, FERRITIN, TIBC, IRON, RETICCTPCT in the last 72 hours. Sepsis Labs: No results for input(s): PROCALCITON, LATICACIDVEN in the last 168 hours.  No results found for this or any previous visit (from the past 240 hour(s)).    Radiology Studies: No results found.  Scheduled Meds: . sodium chloride   Intravenous Once  . sodium chloride   Intravenous Once  . sodium chloride   Intravenous Once  . sodium chloride   Intravenous Once  . Chlorhexidine Gluconate Cloth  6 each Topical Daily  . dextrose      . feeding supplement  237 mL Oral BID BM  . furosemide  40 mg Oral BID  . guaiFENesin  1,200 mg Oral BID  . levothyroxine  112 mcg Oral Q0600  . mouth rinse  15 mL Mouth Rinse BID  . multivitamin with minerals  1 tablet Oral Daily  . potassium chloride  40 mEq Oral BID  . sodium chloride flush  10-40 mL Intracatheter Q12H   Continuous Infusions: . sodium chloride 250 mL (04/24/20 1835)  . dextrose 5 % and 0.45% NaCl 50 mL/hr at 04/29/20 0430     LOS: 16 days   Time spent: 40 minutes   Jhane Lorio Loann Quill, MD [facility]  If 7PM-7AM, please contact night-coverage [email or web address] 04/29/2020, 1:00 PM

## 2020-04-29 NOTE — Progress Notes (Signed)
[facility]  Subjective  - Pain in stump, no appetite.   Objective (!) 114/53 73 (!) 97.5 F (36.4 C) (Oral) 18 100%  Intake/Output Summary (Last 24 hours) at 04/29/2020 0718 Last data filed at 04/29/2020 0100 Gross per 24 hour  Intake 750 ml  Output 400 ml  Net 350 ml    Right AKA dressing clean and dry Lungs productive cough, non labored breathing A & O  Assessment/Planning: POD # 1 Right AKA Plan to change dressing tomorrow. Stable receiving PRBC this am for post anemia HGB 6.2, Plt. 119 Hypotension stable, HR RRR   Roxy Horseman 04/29/2020 7:18 AM --  Laboratory Lab Results: Recent Labs    04/28/20 0346 04/29/20 0400  WBC 9.2 10.2  HGB 7.1* 6.2*  HCT 23.7* 21.4*  PLT 82* 119*   BMET Recent Labs    04/27/20 0423 04/27/20 0825 04/28/20 0346 04/28/20 1224 04/29/20 0000 04/29/20 0400  NA 139  --  139  --   --   --   K 4.5  --  4.1  --   --   --   CL 99  --  99  --   --   --   CO2 33*  --  33*  --   --   --   GLUCOSE 84   < > 77   < > 66* 165*  BUN 11  --  14  --   --   --   CREATININE 0.96  --  1.06*  --   --   --   CALCIUM 7.8*  --  7.6*  --   --   --    < > = values in this interval not displayed.    COAG Lab Results  Component Value Date   INR 1.6 (H) 04/14/2020   INR 1.6 (H) 04/14/2020   No results found for: PTT

## 2020-04-29 NOTE — Progress Notes (Signed)
Physical Therapy Treatment/Re-evaluation Patient Details Name: Michelle Avila MRN: [id number] DOB: 29-Jul-1945 Today's Date: 04/29/2020    History of Present Illness Pt adm 10/16 with septic and hypovolemic shock secondary to Flavobacterium Odoratum bacteremia with likely possible concomitant pneumonia. Pt with acute encephalopathy.  Pt was found at home on the floor by her daughter who came from out of state after her neighbors had not seen or heard from her in several days. 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 PMH - hypothyroidism.    PT Comments    Pt now s/p rt AKA. Updated pt goals to reflect her recent surgery. Continue to recommend ST-SNF at dc. Pt tolerated sitting EOB today and expect progress will be slow. Pt remains anxious about falling.    Follow Up Recommendations  SNF;Supervision/Assistance - 24 hour     Equipment Recommendations  Wheelchair (measurements PT);Wheelchair cushion (measurements PT)    Recommendations for Other Services       Precautions / Restrictions Precautions Precautions: Fall    Mobility  Bed Mobility Overal bed mobility: Needs Assistance Bed Mobility: Supine to Sit;Sit to Supine     Supine to sit: Max assist;HOB elevated Sit to supine: Mod assist;HOB elevated   General bed mobility comments: Mod assist to bring RLE to EOB and to elevate trunk into sitting. Max assist to bring hips to EOB due to pt's fear of falling. Assist to lower trunk to return to supine.  Transfers                 General transfer comment: did not attempt  Ambulation/Gait                 Stairs             Wheelchair Mobility    Modified Rankin (Stroke Patients Only)       Balance Overall balance assessment: Needs assistance Sitting-balance support: Feet supported;Bilateral upper extremity supported Sitting balance-Leahy Scale: Poor Sitting balance - Comments: Pt sat EOB x 6-7 minutes with min assist   Postural control: Posterior lean                                  Cognition Arousal/Alertness: Awake/alert Behavior During Therapy: WFL for tasks assessed/performed;Anxious Overall Cognitive Status: No family/caregiver present to determine baseline cognitive functioning                     Current Attention Level: Sustained;Selective   Following Commands: Follows one step commands consistently     Problem Solving: Slow processing;Requires verbal cues        Exercises      General Comments        Pertinent Vitals/Pain Pain Assessment: Faces Faces Pain Scale: Hurts even more Pain Location: RLE Pain Descriptors / Indicators: Grimacing;Guarding Pain Intervention(s): Limited activity within patient's tolerance;Monitored during session;Repositioned    Home Living                      Prior Function            PT Goals (current goals can now be found in the care plan section) Acute Rehab PT Goals PT Goal Formulation: With patient Time For Goal Achievement: 05/13/20 Potential to Achieve Goals: Fair Progress towards PT goals: Goals downgraded-see care plan    Frequency    Min 2X/week      PT Plan  Current plan remains appropriate    Co-evaluation              AM-PAC PT "6 Clicks" Mobility   Outcome Measure  Help needed turning from your back to your side while in a flat bed without using bedrails?: A Lot Help needed moving from lying on your back to sitting on the side of a flat bed without using bedrails?: A Lot Help needed moving to and from a bed to a chair (including a wheelchair)?: Total Help needed standing up from a chair using your arms (e.g., wheelchair or bedside chair)?: Total Help needed to walk in hospital room?: Total Help needed climbing 3-5 steps with a railing? : Total 6 Click Score: 8    End of Session   Activity Tolerance: Patient limited by fatigue;Patient limited by pain Patient left: with call  bell/phone within reach;in bed;with bed alarm set;with family/visitor present (chair alarm needing new batteries- NT getting them)   PT Visit Diagnosis: Difficulty in walking, not elsewhere classified (R26.2);Muscle weakness (generalized) (M62.81)     Time: 1640-1700 PT Time Calculation (min) (ACUTE ONLY): 20 min  Charges:                        [place] Pager [phone] Office [place] 04/29/2020, 6:13 PM

## 2020-04-29 NOTE — Progress Notes (Signed)
Daily Progress Note   Patient Name: Michelle Avila       Date: 04/29/2020 DOB: 1945-11-13  Age: 74 y.o. MRN#: [id number] Attending Physician: Mckinley Jewel, MD Primary Care Physician: Street, Sharon Mt, MD Admit Date: 04/12/2020  Reason for Consultation/Follow-up: Establishing goals of care  Subjective: I saw and examined patient today.  Last time I had seen her was around a week ago.  She is now status post amputation.  She was awake and alert and lying in bed angry to me on entering the room.  Her prior confusion and slight paranoia have resolved.  She was able to appropriately engage in conversation with me and we discussed care plan moving forward.  I introduced concept of a MOST form and she was agreeable to consideration for completing this prior to discharge, however, she reports being too tired to think about this today.  Her daughter, Michelle Avila, was at the bedside as well.  With Michelle Avila's permission I reviewed MOST form with her daughter and we discussed options for care moving forward.  Plan is for follow-up meeting in the next day or 2 to consider completion of MOST form prior to discharge to skilled facility for rehab.   Length of Stay: 16  Current Medications: Scheduled Meds:  . sodium chloride   Intravenous Once  . sodium chloride   Intravenous Once  . sodium chloride   Intravenous Once  . sodium chloride   Intravenous Once  . Chlorhexidine Gluconate Cloth  6 each Topical Daily  . feeding supplement  237 mL Oral BID BM  . furosemide  40 mg Oral BID  . guaiFENesin  1,200 mg Oral BID  . levothyroxine  112 mcg Oral Q0600  . mouth rinse  15 mL Mouth Rinse BID  . multivitamin with minerals  1 tablet Oral Daily  . potassium chloride  40 mEq Oral BID  .  sodium chloride flush  10-40 mL Intracatheter Q12H    Continuous Infusions: . sodium chloride 250 mL (04/24/20 1835)    PRN Meds: acetaminophen, docusate sodium, HYDROmorphone (DILAUDID) injection, ipratropium, levalbuterol, ondansetron (ZOFRAN) IV, oxyCODONE, polyethylene glycol, Resource ThickenUp Clear, sodium chloride flush  Physical Exam    Alert, awake, in no acute distress.     Frail and chronically ill appearing, but much more interactive and able  to carry on a conversation than last time I saw her.  Regular work of breathing Has edema Abdomen not distended.  AKA noted       Vital Signs: BP (!) 104/56   Pulse 66   Temp 97.8 F (36.6 C) (Oral)   Resp 17   Ht 5\' 5"  (1.651 m)   Wt 50.9 kg   SpO2 98%   BMI 18.67 kg/m  SpO2: SpO2: 98 % O2 Device: O2 Device: Nasal Cannula O2 Flow Rate: O2 Flow Rate (L/min): 2 L/min  Intake/output summary:   Intake/Output Summary (Last 24 hours) at 04/29/2020 1828 Last data filed at 04/29/2020 0100 Gross per 24 hour  Intake --  Output 100 ml  Net -100 ml   LBM: Last BM Date: 04/25/20 Baseline Weight: Weight: 45.4 kg Most recent weight: Weight: 50.9 kg       Palliative Assessment/Data:    Flowsheet Rows     Most Recent Value  Intake Tab  Referral Department Hospitalist  Unit at Time of Referral Other (Comment)  [urology/telementry]  Palliative Care Primary Diagnosis Sepsis/Infectious Disease  Date Notified 04/18/20  Palliative Care Type New Palliative care  Reason for referral Clarify Goals of Care  Date of Admission 04/12/20  Date first seen by Palliative Care 04/19/20  # of days Palliative referral response time 1 Day(s)  # of days IP prior to Palliative referral 6  Clinical Assessment  Psychosocial & Spiritual Assessment  Palliative Care Outcomes      Patient Active Problem List   Diagnosis Date Noted  . Protein-calorie malnutrition, severe 04/28/2020  . Pulmonary infiltrates on CXR 04/19/2020  . Altered  mental status   . Thrombocytopenia ([place])   . Dehydration 04/13/2020  . Shock ([place]) 04/13/2020  . Septic shock ([place]) 04/13/2020  . Pressure injury of skin 04/13/2020    Palliative Care Assessment & Plan  Recommendations/Plan: -Goal moving forward is to transition to skilled facility for rehab.  Following this, her daughter wants to work to transition her to [place] in order to be closer to her.  Her daughter reports and plan was for Michelle Avila to move in with her, however patient reports that she may want to find her own place in more of an assisted living environment there. -We reviewed a MOST form and discussed completion of MOST to outline her wishes prior to discharge.  I reviewed this with her daughter today.  Patient reports that she is open to conversation, however, she reports being too tired to talk about this today.  She is agreeable to follow-up prior to discharge in order to further consider completion of MOST to outline her desires for her care moving forward.   Code Status:    Code Status Orders  (From admission, onward)         Start     Ordered   04/16/20 1544  Do not attempt resuscitation (DNR)  Continuous       Question Answer Comment  In the event of cardiac or respiratory ARREST Do not call a "code blue"   In the event of cardiac or respiratory ARREST Do not perform Intubation, CPR, defibrillation or ACLS   In the event of cardiac or respiratory ARREST Use medication by any route, position, wound care, and other measures to relive pain and suffering. May use oxygen, suction and manual treatment of airway obstruction as needed for comfort.      04/16/20 1543        Code Status History  Date Active Date Inactive Code Status Order ID Comments User Context   04/13/2020 0213 04/16/2020 1543 Full Code [id number]  Shellia Cleverly, MD ED   Advance Care Planning Activity      Prognosis:  Guarded  Discharge Planning: To Be Determined  Care plan was  discussed with patient's daughter   Thank you for allowing the Palliative Medicine Team to assist in the care of this patient.   Time In: 1410 Time Out: 1445 Total Time 35 Prolonged Time Billed No   Greater than 50%  of this time was spent counseling and coordinating care related to the above assessment and plan.  [name], MD  Please contact Palliative Medicine Team phone at [phone] for questions and concerns.

## 2020-04-29 NOTE — Progress Notes (Signed)
   ASSESSMENT & PLAN:  Michelle Avila is a 74 y.o. female with critical limb ischemia of right lower extremity with profound ischemic changes without options for vascular reconstruction.  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   Note hbg 7.1 --> 6.2. Agree with plans to transfuse.   PRN pain control. PT/OT/OOB to chair. Dressing down POD#2. Ultimate disposition to inpatient rehab / SNF.   SUBJECTIVE:  Looks OK this AM. Blood transfusing. Pain controlled.  OBJECTIVE:  BP (!) 109/52   Pulse 72   Temp (!) 97.4 F (36.3 C) (Oral)   Resp 18   Ht 5\' 5"  (1.651 m)   Wt 50.9 kg   SpO2 100%   BMI 18.67 kg/m   Intake/Output Summary (Last 24 hours) at 04/29/2020 3612 Last data filed at 04/29/2020 0100 Gross per 24 hour  Intake 750 ml  Output 400 ml  Net 350 ml    Constitutional: thin, elderly appearing. no acute distress. Cardiac: RRR. Vascular: R AKA dressing clean and dry.  CBC Latest Ref Rng & Units 04/29/2020 04/28/2020 04/27/2020  WBC 4.0 - 10.5 K/uL 10.2 9.2 10.6(H)  Hemoglobin 12.0 - 15.0 g/dL 6.2(LL) 7.1(L) 8.0(L)  Hematocrit 36 - 46 % 21.4(L) 23.7(L) 26.8(L)  Platelets 150 - 400 K/uL 119(L) 82(L) 63(L)     CMP Latest Ref Rng & Units 04/29/2020 04/29/2020 04/28/2020  Glucose 70 - 99 mg/dL 165(H) 66(L) 71  BUN 8 - 23 mg/dL - - -  Creatinine 0.44 - 1.00 mg/dL - - -  Sodium 135 - 145 mmol/L - - -  Potassium 3.5 - 5.1 mmol/L - - -  Chloride 98 - 111 mmol/L - - -  CO2 22 - 32 mmol/L - - -  Calcium 8.9 - 10.3 mg/dL - - -  Total Protein 6.5 - 8.1 g/dL - - -  Total Bilirubin 0.3 - 1.2 mg/dL - - -  Alkaline Phos 38 - 126 U/L - - -  AST 15 - 41 U/L - - -  ALT 0 - 44 U/L - - -   Kateri Balch N. Stanford Breed, MD [facility] Phone Number: [phone] 04/29/2020 7:02 AM

## 2020-04-30 DIAGNOSIS — N289 Disorder of kidney and ureter, unspecified: Secondary | ICD-10-CM | POA: Diagnosis not present

## 2020-04-30 DIAGNOSIS — R4182 Altered mental status, unspecified: Secondary | ICD-10-CM | POA: Diagnosis not present

## 2020-04-30 DIAGNOSIS — Z7189 Other specified counseling: Secondary | ICD-10-CM | POA: Diagnosis not present

## 2020-04-30 DIAGNOSIS — R6521 Severe sepsis with septic shock: Secondary | ICD-10-CM | POA: Diagnosis not present

## 2020-04-30 DIAGNOSIS — A419 Sepsis, unspecified organism: Secondary | ICD-10-CM | POA: Diagnosis not present

## 2020-04-30 LAB — CBC WITH DIFFERENTIAL/PLATELET
Abs Immature Granulocytes: 0 10*3/uL (ref 0.00–0.07)
Basophils Absolute: 0 10*3/uL (ref 0.0–0.1)
Basophils Relative: 0 %
Eosinophils Absolute: 0.2 10*3/uL (ref 0.0–0.5)
Eosinophils Relative: 1 %
HCT: 29.2 % — ABNORMAL LOW (ref 36.0–46.0)
Hemoglobin: 9.1 g/dL — ABNORMAL LOW (ref 12.0–15.0)
Lymphocytes Relative: 1 %
Lymphs Abs: 0.2 10*3/uL — ABNORMAL LOW (ref 0.7–4.0)
MCH: 28.8 pg (ref 26.0–34.0)
MCHC: 31.2 g/dL (ref 30.0–36.0)
MCV: 92.4 fL (ref 80.0–100.0)
Monocytes Absolute: 0.2 10*3/uL (ref 0.1–1.0)
Monocytes Relative: 1 %
Neutro Abs: 14.7 10*3/uL — ABNORMAL HIGH (ref 1.7–7.7)
Neutrophils Relative %: 97 %
Platelets: 185 10*3/uL (ref 150–400)
RBC: 3.16 MIL/uL — ABNORMAL LOW (ref 3.87–5.11)
RDW: 23.9 % — ABNORMAL HIGH (ref 11.5–15.5)
WBC: 15.2 10*3/uL — ABNORMAL HIGH (ref 4.0–10.5)
nRBC: 0 % (ref 0.0–0.2)
nRBC: 0 /100 WBC

## 2020-04-30 LAB — COMPREHENSIVE METABOLIC PANEL
ALT: 18 U/L (ref 0–44)
AST: 21 U/L (ref 15–41)
Albumin: 1.5 g/dL — ABNORMAL LOW (ref 3.5–5.0)
Alkaline Phosphatase: 186 U/L — ABNORMAL HIGH (ref 38–126)
Anion gap: 6 (ref 5–15)
BUN: 13 mg/dL (ref 8–23)
CO2: 32 mmol/L (ref 22–32)
Calcium: 7.7 mg/dL — ABNORMAL LOW (ref 8.9–10.3)
Chloride: 97 mmol/L — ABNORMAL LOW (ref 98–111)
Creatinine, Ser: 0.99 mg/dL (ref 0.44–1.00)
GFR, Estimated: 59 mL/min — ABNORMAL LOW (ref >60)
Glucose, Bld: 108 mg/dL — ABNORMAL HIGH (ref 70–99)
Potassium: 4.2 mmol/L (ref 3.5–5.1)
Sodium: 135 mmol/L (ref 135–145)
Total Bilirubin: 0.6 mg/dL (ref 0.3–1.2)
Total Protein: 5.1 g/dL — ABNORMAL LOW (ref 6.5–8.1)

## 2020-04-30 LAB — GLUCOSE, CAPILLARY
Glucose-Capillary: 69 mg/dL — ABNORMAL LOW (ref 70–99)
Glucose-Capillary: 97 mg/dL (ref 70–99)
Glucose-Capillary: 85 mg/dL (ref 70–99)
Glucose-Capillary: 101 mg/dL — ABNORMAL HIGH (ref 70–99)

## 2020-04-30 LAB — GLUCOSE, RANDOM: Glucose, Bld: 109 mg/dL — ABNORMAL HIGH (ref 70–99)

## 2020-04-30 LAB — SURGICAL PATHOLOGY

## 2020-04-30 NOTE — Plan of Care (Signed)

## 2020-04-30 NOTE — NC FL2 (Signed)
[place] LEVEL OF CARE SCREENING TOOL     IDENTIFICATION  Patient Name: Michelle Avila Birthdate: 05/05/1946 Sex: female Admission Date (Current Location): 04/12/2020  [facility] and [place] Number:  [place] and Address:  [facility],  [place] [address], [place]      Provider Number: [id number]  Attending Physician Name and Address:  Mckinley Jewel, MD  Relative Name and Phone Number:  Lawrence Santiago Daughter [phone]    Current Level of Care: Hospital Recommended Level of Care: [place] Prior Approval Number:    Date Approved/Denied:   PASRR Number: [id number]  Discharge Plan: SNF    Current Diagnoses: Patient Active Problem List   Diagnosis Date Noted  . Protein-calorie malnutrition, severe 04/28/2020  . Pulmonary infiltrates on CXR 04/19/2020  . Altered mental status   . Thrombocytopenia ([place])   . Dehydration 04/13/2020  . Shock ([place]) 04/13/2020  . Septic shock ([place]) 04/13/2020  . Pressure injury of skin 04/13/2020    Orientation RESPIRATION BLADDER Height & Weight     Self  O2 (5L) Incontinent Weight: 50.9 kg Height:  5\' 5"  (165.1 cm)  BEHAVIORAL SYMPTOMS/MOOD NEUROLOGICAL BOWEL NUTRITION STATUS      Continent Diet (Dysphagia 2)  AMBULATORY STATUS COMMUNICATION OF NEEDS Skin   Total Care Verbally Surgical wounds, Bruising, PU Stage and Appropriate Care     PU Stage 3 Dressing:  (PRN dressing change)                 Personal Care Assistance Level of Assistance  Bathing, Feeding, Dressing Bathing Assistance: Limited assistance Feeding assistance: Limited assistance Dressing Assistance: Maximum assistance     Functional Limitations Info  Sight, Hearing, Speech Sight Info: Impaired Hearing Info: Impaired Speech Info: Adequate    SPECIAL CARE FACTORS FREQUENCY  PT (By licensed PT), OT (By licensed OT)     PT Frequency: Minimum 5x a week OT  Frequency: Minimum 5x a week            Contractures Contractures Info: Not present    Additional Factors Info  Code Status, Allergies Code Status Info: DNR Allergies Info: NA           Current Medications (04/30/2020):  This is the current hospital active medication list Current Facility-Administered Medications  Medication Dose Route Frequency Provider Last Rate Last Admin  . 0.9 %  sodium chloride infusion (Manually program via Guardrails IV Fluids)   Intravenous Once Barbie Banner, PA-C      . 0.9 %  sodium chloride infusion (Manually program via Guardrails IV Fluids)   Intravenous Once Barbie Banner, PA-C      . 0.9 %  sodium chloride infusion (Manually program via Guardrails IV Fluids)   Intravenous Once Barbie Banner, PA-C      . 0.9 %  sodium chloride infusion (Manually program via Guardrails IV Fluids)   Intravenous Once Lang Snow, FNP      . 0.9 %  sodium chloride infusion  250 mL Intravenous Continuous Barbie Banner, PA-C 10 mL/hr at 04/24/20 1835 250 mL at 04/24/20 1835  . acetaminophen (TYLENOL) tablet 650 mg  650 mg Oral Q6H PRN Barbie Banner, PA-C   650 mg at 04/27/20 1220  . Chlorhexidine Gluconate Cloth 2 % PADS 6 each  6 each Topical Daily Barbie Banner, PA-C   6 each at 04/29/20 1116  . docusate sodium (COLACE) capsule 100 mg  100  mg Oral BID PRN Barbie Banner, PA-C      . feeding supplement (ENSURE ENLIVE / ENSURE PLUS) liquid 237 mL  237 mL Oral BID BM Barbie Banner, PA-C   237 mL at 04/30/20 1405  . furosemide (LASIX) tablet 40 mg  40 mg Oral BID Pahwani, Rinka R, MD   40 mg at 04/30/20 0905  . guaiFENesin (MUCINEX) 12 hr tablet 1,200 mg  1,200 mg Oral BID Barbie Banner, PA-C   1,200 mg at 04/30/20 3016  . HYDROmorphone (DILAUDID) injection 0.5 mg  0.5 mg Intravenous Q4H PRN Setzer, Edman Circle, PA-C      . ipratropium (ATROVENT) nebulizer solution 0.5 mg  0.5 mg Nebulization Q6H PRN Setzer, Edman Circle, PA-C      . levalbuterol  (XOPENEX) nebulizer solution 0.63 mg  0.63 mg Nebulization Q6H PRN Setzer, Edman Circle, PA-C      . levothyroxine (SYNTHROID) tablet 112 mcg  112 mcg Oral Q0600 Barbie Banner, PA-C   112 mcg at 04/30/20 0535  . MEDLINE mouth rinse  15 mL Mouth Rinse BID Barbie Banner, PA-C   15 mL at 04/30/20 0109  . multivitamin with minerals tablet 1 tablet  1 tablet Oral Daily Barbie Banner, PA-C   1 tablet at 04/30/20 3235  . ondansetron (ZOFRAN) injection 4 mg  4 mg Intravenous Q6H PRN Setzer, Edman Circle, PA-C      . oxyCODONE (Oxy IR/ROXICODONE) immediate release tablet 10 mg  10 mg Oral Q4H PRN Barbie Banner, PA-C   10 mg at 04/30/20 0226  . polyethylene glycol (MIRALAX / GLYCOLAX) packet 17 g  17 g Oral Daily PRN Setzer, Edman Circle, PA-C      . potassium chloride 20 MEQ/15ML (10%) solution 40 mEq  40 mEq Oral BID Barbie Banner, PA-C   40 mEq at 04/29/20 2134  . Resource ThickenUp Clear   Oral PRN Barbie Banner, PA-C      . sodium chloride flush (NS) 0.9 % injection 10-40 mL  10-40 mL Intracatheter Q12H Barbie Banner, PA-C   10 mL at 04/30/20 0909  . sodium chloride flush (NS) 0.9 % injection 10-40 mL  10-40 mL Intracatheter PRN Barbie Banner, PA-C   10 mL at 04/18/20 0440     Discharge Medications: Please see discharge summary for a list of discharge medications.  Relevant Imaging Results:  Relevant Lab Results:   Additional Information SSN [id number]  Curlene Labrum, RN

## 2020-04-30 NOTE — Progress Notes (Signed)
Dr. Doristine Bosworth to d/c q 8 hour blood glucose draws.

## 2020-04-30 NOTE — TOC Progression Note (Addendum)
Transition of Care [facility]) - Progression Note    Patient Details  Name: Michelle Avila MRN: [id number] Date of Birth: 12-30-1945  Transition of [facility] CM/SW [place], RN Phone Number: 04/30/2020, 2:36 PM  Clinical Narrative:    Case management met with the patient at the bedside and she asked what she was "doing in the hospital.  Why am I here".  The patient was reoriented and I called the patient's daughter, [name] on the phone to offer choice regarding SNf placement.  The patient's daughter lives in [place] and plans to choose a [place] facility for the patient and afterwards the daughter plans to move her to [place] where she is living.  The daughter, listed on the facesheet works at a hospital in [place].  The daughter was given Medicare choice regarding SNF placement and she plans to call me back regarding her choice of facilities.  I plan to start insurance authorization once the family chooses.  I will confirm with the daughter the patient's COVID immunization history.  Daughter states that the patient has not had the COVID vaccine to her knowledge.  Will continue to follow the patient for transition to a [facility] facility.   Expected Discharge Plan: [place] Barriers to Discharge: Continued Medical Work up  Expected Discharge Plan and Services Expected Discharge Plan: [place]   Discharge Planning Services: CM Consult Post Acute Care Choice: [place] Living arrangements for the past 2 months: Single Family Home                                       Social Determinants of Health (SDOH) Interventions    Readmission Risk Interventions Readmission Risk Prevention Plan 04/30/2020  Transportation Screening Complete  PCP or Specialist Appt within 5-7 Days Complete  Home Care Screening Complete  Medication Review (RN CM) Complete  Some recent data might be hidden

## 2020-04-30 NOTE — Progress Notes (Signed)
Daily Progress Note   Patient Name: Michelle Avila       Date: 04/30/2020 DOB: Dec 23, 1945  Age: 74 y.o. MRN#: [id number] Attending Physician: Mckinley Jewel, MD Primary Care Physician: Street, Sharon Mt, MD Admit Date: 04/12/2020  Reason for Consultation/Follow-up: Establishing goals of care  Subjective: I saw and examined patient today.  She was sitting in her bed with tray table at the bedside.  States that she is unhappy with what was brought to her for lunch.  Discussed again about longer-term goals of care and she states that she is again too tired to think about this today.  She does me to call and update her daughter.  I called and spoke with [name] via phone.  We talked about plan of care moving forward being transition to SNF for rehab followed by transition to [place] to be closer to her.  I again recommended consideration for completion of MOST form with her mother is willing to consider prior to discharge.  If not, I recommend this be something to be followed up with her at the skilled facility through outpatient palliative care.  Length of Stay: 17  Current Medications: Scheduled Meds:  . sodium chloride   Intravenous Once  . sodium chloride   Intravenous Once  . sodium chloride   Intravenous Once  . sodium chloride   Intravenous Once  . Chlorhexidine Gluconate Cloth  6 each Topical Daily  . feeding supplement  237 mL Oral BID BM  . furosemide  40 mg Oral BID  . guaiFENesin  1,200 mg Oral BID  . levothyroxine  112 mcg Oral Q0600  . mouth rinse  15 mL Mouth Rinse BID  . multivitamin with minerals  1 tablet Oral Daily  . potassium chloride  40 mEq Oral BID  . sodium chloride flush  10-40 mL Intracatheter Q12H    Continuous Infusions: . sodium chloride  250 mL (04/24/20 1835)    PRN Meds: acetaminophen, docusate sodium, HYDROmorphone (DILAUDID) injection, ipratropium, levalbuterol, ondansetron (ZOFRAN) IV, oxyCODONE, polyethylene glycol, Resource ThickenUp Clear, sodium chloride flush  Physical Exam    Alert, awake, in no acute distress.     Frail and chronically ill appearing, remains interactive but more irritable today.  Regular work of breathing Has edema Abdomen not distended.  AKA noted  Vital Signs: BP 106/60 (BP Location: Left Arm)   Pulse 77   Temp 98.3 F (36.8 C) (Oral)   Resp 16   Ht 5\' 5"  (1.651 m)   Wt 50.9 kg   SpO2 97%   BMI 18.67 kg/m  SpO2: SpO2: 97 % O2 Device: O2 Device: Nasal Cannula O2 Flow Rate: O2 Flow Rate (L/min): 2 L/min  Intake/output summary:   Intake/Output Summary (Last 24 hours) at 04/30/2020 1823 Last data filed at 04/30/2020 0500 Gross per 24 hour  Intake 240 ml  Output 400 ml  Net -160 ml   LBM: Last BM Date: 04/25/20 Baseline Weight: Weight: 45.4 kg Most recent weight: Weight: 50.9 kg       Palliative Assessment/Data:    Flowsheet Rows     Most Recent Value  Intake Tab  Referral Department Hospitalist  Unit at Time of Referral Other (Comment)  [urology/telementry]  Palliative Care Primary Diagnosis Sepsis/Infectious Disease  Date Notified 04/18/20  Palliative Care Type New Palliative care  Reason for referral Clarify Goals of Care  Date of Admission 04/12/20  Date first seen by Palliative Care 04/19/20  # of days Palliative referral response time 1 Day(s)  # of days IP prior to Palliative referral 6  Clinical Assessment  Psychosocial & Spiritual Assessment  Palliative Care Outcomes      Patient Active Problem List   Diagnosis Date Noted  . Protein-calorie malnutrition, severe 04/28/2020  . Pulmonary infiltrates on CXR 04/19/2020  . Altered mental status   . Thrombocytopenia ([place])   . Dehydration 04/13/2020  . Shock ([place]) 04/13/2020  . Septic shock ([place])  04/13/2020  . Pressure injury of skin 04/13/2020    Palliative Care Assessment & Plan  Recommendations/Plan: -During my encounter today, patient was irritable and stated it was because she did not get what she wanted for lunch.  She also stated that she felt people were, "trying to kill her with that food" but in talking with her further I believe she was joking about this rather than having paranoid thoughts. -Goal moving forward is to transition to skilled facility for rehab.  Following this, her daughter wants to work to transition her to [place] in order to be closer to her.  Her daughter reports and plan was for Ms. Sterbenz to move in with her, however patient reports that she may want to find her own place in more of an assisted living environment there. -I talked again with patient about a MOST form.  She again stated that it is something she would think about but did not want to complete at this time.    Code Status:    Code Status Orders  (From admission, onward)         Start     Ordered   04/16/20 1544  Do not attempt resuscitation (DNR)  Continuous       Question Answer Comment  In the event of cardiac or respiratory ARREST Do not call a "code blue"   In the event of cardiac or respiratory ARREST Do not perform Intubation, CPR, defibrillation or ACLS   In the event of cardiac or respiratory ARREST Use medication by any route, position, wound care, and other measures to relive pain and suffering. May use oxygen, suction and manual treatment of airway obstruction as needed for comfort.      04/16/20 1543        Code Status History    Date Active Date Inactive Code Status Order ID Comments  User Context   04/13/2020 0213 04/16/2020 1543 Full Code [id number]  Shellia Cleverly, MD ED   Advance Care Planning Activity      Discharge Planning: [place] for rehab with Palliative care service follow-up  Care plan was discussed with patient's daughter    Thank you for allowing the Palliative Medicine Team to assist in the care of this patient.   Time In: 1205 Time Out: 1230 Total Time 25 Prolonged Time Billed No   Greater than 50%  of this time was spent counseling and coordinating care related to the above assessment and plan.  [name], MD  Please contact Palliative Medicine Team phone at [phone] for questions and concerns.

## 2020-04-30 NOTE — Progress Notes (Signed)
PROGRESS NOTE    Michelle Avila  LAG:[id number] DOB: Apr 05, 1946 DOA: 04/12/2020 PCP: Michelle Avila, Michelle Mt, MD   Brief Narrative:  The patient is a 74 year old female with PMH for hypothyroidism who presented with AMS, hypoglycemia, dehydration. From report neighbors had not seen or heard from her in several days and daughter came in from out of state and found the patient on the floor in feces and was very altered. EMS was dispatched and upon evaluation her blood sugar was 19 and so they gave her D10. She was brought to the ED and found to be dehydrated and given 2-1/2 L of IV fluids. CT head was obtained without acute changes. Blood and urine cultures were ordered but the ED felt the picture was more consistent with dehydration and hypoglycemia rather than infection so she was not started on antibiotics at that time. Her mentation largely improved in the ED and she endorsed a recent med change and was placed on a diuretic for her bilateral lower extremity edema.  Further work-up revealed that she ended up going into septic shock and hypovolemic shock from a gram-negative bacteremia likely from being in her feces and possible colitis. She was started on IV Zosyn briefly then changed to Levaquin and then to ciprofloxacin. She was placed on pressors and had encephalopathy and electrolyte derangements. They were adjusted and corrected and she was given albumin and stabilized in the ICU.  She had some dysphagia so she was n.p.o. and SLP evaluated and they are now recommending a dysphagia 2 diet with nectar thick liquids.  She is transferred to [facility] service 04/18/2020. During her hospitalization she had critical illness thrombocytopenia and anemia. She gets transfusions as needed while bone marrow recovers from sepsis.   Patient's platelet count continues to drop-she received multiple platelet transfusion and 1 unit PRBC transfusion.  Palliative care was consulted for goals of care  discussion.  Thrombocytopenia was also discussed with Dr. Alen Blew who feels that this is secondary to her septic shock and her bacteremia and recommend supportive care at this time and transfusions.   Due to her ongoing right foot wound/mummification, a consult with ortho and vascular surgery was pursued. She will likely need a right AKA and intense wound care with her LLE therefore she transferred to [facility] on 10/28..   Assessment & Plan:   Nonsalvageable right lower extremity: -S/p right AKA, POD 2 by vascular surgery-Dr. Stanford Breed -Oxycodone/Dilaudid as needed for pain control. -Received 2 unit PRBC perioperatively -H&H this morning trended down from 7.1-6.2.  Status post 1 unit PRBC transfusion. -H&H improved from 6.2-7.9 after 1 unit PRBC transfusion -Repeat CBC is pending. -Appreciate vascular surgery help.  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 -PT OT evaluation done and recommending SNF  left leg wounds -Patient also has extensive excoriations and wounds noted on left lower extremity.  -Wound care  Acute Encephalopathy: Resolved -Likely multifactorial including her severe hypoglycemia, dehydration, septic shock from colitis and bacteremia -CT head negative for acute findings. -Continue delirium precautions  Volume overload : Improving  -in the setting of volume resuscitation as well as acute on chronic diastolic CHF -Echocardiogram showed a normal EF but  grade 2 diastolic dysfunction -BNP was elevated at 1,113.0 and improved to 437.2 -Strict I's and O's and daily weights -Continue  Lasix  Acute Respiratory Failure with Hypoxia: Improving -Continue diuresis.  Patient finished Levaquin for 14 days. -continue pulmonary toileting, guaifenesin 1200 mg p.o. twice daily, flutter valve, incentive spirometer, Xopenex and Atrovent breathing treatments -On as needed oxygen  Dysphagia -SLP evaluated and recommended dysphagia 2 nectar thick diet with allowing teaspoon of thins   Severe Protein Calorie Malnutrition/Failure to Thrive -Continue on a dysphagia 2 diet  Hypothyroidism -Patient's TSH was 9.921 and free T4 0.67 -Continue levothyroxine  Abnormal LFTs, improving -Reviewed liver Doppler done on 10/18  -Continue monitor and trend and repeat CMP in a.m.  Thrombocytopenia -In the Setting of Septic Shock and her gram-negative bacteremia. oncology feels that this is secondary to her infection and not related to HUS TTP or ITP -s/p 5 units PLT for admission -continue holding chemical DVT ppx -Platelet count: Improving.  136  Hypomagnesemia: Resolved  Normocytic anemia/anemia of critical illness - baseline roughtly 8-9 trended down to 6.2 after the procedure.  Status post 1 unit PRBC transfusion on 11/2. -See above  DVT prophylaxis: SCD  code Status: DNR Family Communication: None present at bedside.  Plan of care discussed with patient in length and she verbalized understanding and agreed with it.  I called patient's daughter [name] and discussed the plan of care and she verbalized understanding.  Disposition Plan: SNF/inpatient rehab  Consultants:   PCCM  Vascular  Orthopedic  Palliative care  Procedures:   Echo  Right AKA  Antimicrobials:   As above  Status is: Inpatient  Dispo: The patient is from: Home              Anticipated d/c is to: SNF              Anticipated d/c date is: 05/02/2020              Patient currently not medically stable for the discharge  Subjective:  Patient seen and examined.  Sitting comfortably on the  bed.  Alert and awake.reports that she is doing fine, denies any complaints.    Objective: Vitals:   04/29/20 0819 04/29/20 1130 04/29/20 2134 04/30/20 0531  BP: (!) 101/53 (!) 104/56 (!) 146/58 (!) 105/53  Pulse: 68 66 84 76  Resp: 17 17 17 16   Temp: 97.6 F (36.4 C) 97.8 F (36.6 C) 98 F (36.7 C) 98.6 F (37 C)  TempSrc: Oral Oral Oral Oral  SpO2: 97% 98% 96% 96%  Weight:      Height:        Intake/Output Summary (Last 24 hours) at 04/30/2020 1241 Last data filed at 04/30/2020 0500 Gross per 24 hour  Intake 240 ml  Output 400 ml  Net -160 ml   Filed Weights   04/20/20 0500 04/22/20 0500 04/23/20 0500  Weight: 54 kg 51.2 kg 50.9 kg    Examination: General exam: Appears calm and comfortable, communicating well Respiratory system: Clear to auscultation. Respiratory effort normal. Cardiovascular system: S1 & S2 heard, RRR. No JVD, murmurs, rubs, gallops or clicks. No pedal edema. Gastrointestinal system: Abdomen is nondistended, soft and nontender. No organomegaly or masses felt. Normal bowel sounds heard. Central nervous system: Alert and oriented. No focal neurological deficits. 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  Left leg: Dressing dry intact. Psychiatry: Judgement and insight appear normal. Mood & affect appropriate.  Data Reviewed: I have  personally reviewed following labs and imaging studies  CBC: Recent Labs  Lab 04/24/20 0402 04/24/20 0402 04/25/20 0415 04/25/20 0415 04/26/20 0413 04/27/20 0423 04/28/20 0346 04/29/20 0400 04/29/20 1600  WBC 11.2*   < > 10.2   < > 10.2 10.6* 9.2 10.2 11.0*  NEUTROABS 9.6*  --  8.6*  --   --  9.2* 7.8* 8.8*  --   HGB 7.8*   < > 7.5*   < > 7.3* 8.0* 7.1* 6.2* 7.9*  HCT 24.8*   < > 24.6*   < > 24.3* 26.8* 23.7* 21.4* 25.1*  MCV 89.9   < > 89.8   < > 93.1 89.9 92.9 96.4 90.3  PLT 19*   < > 49*   < > 46* 63* 82* 119* 136*   < > = values in this interval not displayed.   Basic  Metabolic Panel: Recent Labs  Lab 04/24/20 0402 04/24/20 0402 04/25/20 0415 04/25/20 2356 04/26/20 1100 04/26/20 1541 04/27/20 0423 04/27/20 0825 04/28/20 0346 04/28/20 1224 04/29/20 0400 04/29/20 0815 04/29/20 1200 04/29/20 1600 04/30/20 0024  NA 148*  --  143  --  142  --  139  --  139  --   --   --   --   --   --   K 4.5  --  4.1  --  4.8  --  4.5  --  4.1  --   --   --   --   --   --   CL 104  --  99  --  100  --  99  --  99  --   --   --   --   --   --   CO2 35*  --  33*  --  32  --  33*  --  33*  --   --   --   --   --   --   GLUCOSE 83   < > 81   < > 98   < > 84   < > 77   < > 165* 102* 84 265* 109*  BUN 22  --  21  --  14  --  11  --  14  --   --   --   --   --   --   CREATININE 0.90  --  0.92  --  0.94  --  0.96  --  1.06*  --   --   --   --   --   --   CALCIUM 7.5*  --  7.3*  --  7.8*  --  7.8*  --  7.6*  --   --   --   --   --   --   MG 1.6*  --  1.4*  --  1.4*  --  2.4  --  2.4  --   --   --   --   --   --   PHOS 3.0  --  3.3  --   --   --  3.8  --  4.5  --   --   --   --   --   --    < > = values in this interval not displayed.   GFR: Estimated Creatinine Clearance: 36.8 mL/min (A) (by C-G formula based on SCr of 1.06 mg/dL (H)). Liver Function Tests: Recent Labs  Lab 04/24/20 0402 04/25/20 0415 04/26/20 1100 04/27/20 0423 04/28/20 0346  AST 52* 37 34 27 23  ALT 40 38 81* 32 26  ALKPHOS 371* 329* 298* 269* 217*  BILITOT 1.0 0.8 0.8 0.9 1.0  PROT 4.9* 5.0* 5.4* 5.2* 4.7*  ALBUMIN 1.8* 1.9* 1.8* 1.7* 1.5*   No results for input(s): LIPASE, AMYLASE in the last 168 hours. No results for input(s): AMMONIA in the last 168 hours. Coagulation Profile: No results for input(s): INR, PROTIME in the last 168 hours. Cardiac Enzymes: No results for input(s): CKTOTAL, CKMB, CKMBINDEX, TROPONINI in the last 168 hours. BNP (last 3 results) No results for input(s): PROBNP in the last 8760 hours. HbA1C: No results for input(s): HGBA1C in the last 72  hours. CBG: Recent Labs  Lab 04/26/20 1643 04/28/20 0943 04/28/20 1024 04/30/20 0852 04/30/20 1140  GLUCAP 50* 67* 124* 69* 97   Lipid Profile: No results for input(s): CHOL, HDL, LDLCALC, TRIG, CHOLHDL, LDLDIRECT in the last 72 hours. Thyroid Function Tests: No results for input(s): TSH, T4TOTAL, FREET4, T3FREE, THYROIDAB in the last 72 hours. Anemia Panel: No results for input(s): VITAMINB12, FOLATE, FERRITIN, TIBC, IRON, RETICCTPCT in the last 72 hours. Sepsis Labs: No results for input(s): PROCALCITON, LATICACIDVEN in the last 168 hours.  No results found for this or any previous visit (from the past 240 hour(s)).    Radiology Studies: No results found.  Scheduled Meds: . sodium chloride   Intravenous Once  . sodium chloride   Intravenous Once  . sodium chloride   Intravenous Once  . sodium chloride   Intravenous Once  . Chlorhexidine Gluconate Cloth  6 each Topical Daily  . feeding supplement  237 mL Oral BID BM  . furosemide  40 mg Oral BID  . guaiFENesin  1,200 mg Oral BID  . levothyroxine  112 mcg Oral Q0600  . mouth rinse  15 mL Mouth Rinse BID  . multivitamin with minerals  1 tablet Oral Daily  . potassium chloride  40 mEq Oral BID  . sodium chloride flush  10-40 mL Intracatheter Q12H   Continuous Infusions: . sodium chloride 250 mL (04/24/20 1835)     LOS: 17 days   Time spent: 40 minutes   Rashawna Scoles Loann Quill, MD [facility]  If 7PM-7AM, please contact night-coverage [email or web address] 04/30/2020, 12:41 PM

## 2020-04-30 NOTE — Progress Notes (Addendum)
[facility] came off last night RN replaced it.  Patient comfortable without new complaints.   Objective (!) 105/53 76 98.6 F (37 C) (Oral) 16 96%  Intake/Output Summary (Last 24 hours) at 04/30/2020 0711 Last data filed at 04/30/2020 0500 Gross per 24 hour  Intake 240 ml  Output 400 ml  Net -160 ml    Right AKA dressing intact, clean and dry.  Changed this am by RN.  States stump appears viable without active drainage or erythema.  Staples intact. Lungs non labored breathing  Assessment/Planning: POD # 2 right AKA Per RN stump appears viable.  Will plan on dressing change in am to inspect incision.   Patient comfortable without complaints of pain this am.  PLT improving now 136, HGB improved to 7.9 after multiple PRBC and plt pheresis with thrombocytopenia and normocytic anemia/anemia of critical illness.    Roxy Horseman 04/30/2020 7:11 AM --  Laboratory Lab Results: Recent Labs    04/29/20 0400 04/29/20 1600  WBC 10.2 11.0*  HGB 6.2* 7.9*  HCT 21.4* 25.1*  PLT 119* 136*   BMET Recent Labs    04/28/20 0346 04/28/20 1224 04/29/20 1600 04/30/20 0024  NA 139  --   --   --   K 4.1  --   --   --   CL 99  --   --   --   CO2 33*  --   --   --   GLUCOSE 77   < > 265* 109*  BUN 14  --   --   --   CREATININE 1.06*  --   --   --   CALCIUM 7.6*  --   --   --    < > = values in this interval not displayed.    COAG Lab Results  Component Value Date   INR 1.6 (H) 04/14/2020   INR 1.6 (H) 04/14/2020   No results found for: PTT   VASCULAR STAFF ADDENDUM: I have independently interviewed and examined the patient. I have reviewed the patients chart in detail. I have reviewed relevant diagnostic data. I agree with the above. In brief, continued anemia which has appropriately responded to transfusion. Minimal losses intraoperatively. Local care to the stump - change dressing daily starting tomorrow.  Will need SNF or IPR (if she qualifies) for ultimate disposition. Following.   Yevonne Aline. Stanford Breed, MD [facility] Phone Number: [phone] 04/30/2020 3:51 PM

## 2020-04-30 NOTE — Progress Notes (Signed)
  Speech Language Pathology Treatment: Dysphagia  Patient Details Name: Michelle Avila MRN: [id number] DOB: 1945/11/02 Today's Date: 04/30/2020 Time: 0092-3300 SLP Time Calculation (min) (ACUTE ONLY): 13 min  Assessment / Plan / Recommendation Clinical Impression  Pt was seen for dysphagia treatment. She was alert during the session and speech was intelligible but her responses were frequently unrelated to questions asked. No s/sx of aspiration were noted during the session. Mastication of regular textures was [facility] and no significant oral residue was noted. Symptoms of pharyngeal residue, such a multiple swallows, were not observed and pt independently alternated solids and liquids. Pt's nurse reported that the pt's p.o. intake has been limited and it is recommended that her diet be advanced to regular textures with thin liquids to maximize options. SLP will follow to ensure safety of the advanced diet.    HPI HPI: Pt presented with AMS, was found down covered in feces at home by her neighbors.  PMH for smoking, Pneumonitis due to inhalation of food and vomit in 2016, Pressure ulcer of sacral region, stage 1, Acute respiratory failure with hypoxia, protein-calorie malnutrition, bacterial pneumonia, was living alone, Diarrhea, Fluid overload, Altered mental status, Klebsiella pneumoniae (k. pneumoniae), Pure hypercholesterolemia, Essential (primary) hypertension, Hypotension, Tobacco use, bilirubin metabolism, Escherichia coli (E. coli), , Hypomagnesemia, Hypokalemia, Anemia, unspecified, Body mass index (BMI) 19.9 or less, adult malaise.      SLP Plan  Continue with current plan of care       Recommendations  Diet recommendations: Regular;Thin liquid Liquids provided via: Cup;Straw Medication Administration: Whole meds with puree Supervision: Patient able to self feed;Staff to assist with self feeding;Full supervision/cueing for compensatory strategies Compensations: Slow rate;Small  sips/bites Postural Changes and/or Swallow Maneuvers: Seated upright 90 degrees;Upright 30-60 min after meal                Oral Care Recommendations: Oral care BID Follow up Recommendations: Skilled Nursing facility;24 hour supervision/assistance SLP Visit Diagnosis: Dysphagia, pharyngoesophageal phase (R13.14);Dysphagia, oropharyngeal phase (R13.12) Plan: Continue with current plan of care       Harvey Lingo I. Hardin Negus, [place], [place] Office number [phone] Pager [place] 04/30/2020, 10:17 AM

## 2020-05-01 DIAGNOSIS — A419 Sepsis, unspecified organism: Secondary | ICD-10-CM | POA: Diagnosis not present

## 2020-05-01 DIAGNOSIS — R6521 Severe sepsis with septic shock: Secondary | ICD-10-CM | POA: Diagnosis not present

## 2020-05-01 LAB — TYPE AND SCREEN
ABO/RH(D): O POS
Antibody Screen: NEGATIVE
Unit division: 0
Unit division: 0

## 2020-05-01 LAB — COMPREHENSIVE METABOLIC PANEL
ALT: 17 U/L (ref 0–44)
AST: 18 U/L (ref 15–41)
Albumin: 1.5 g/dL — ABNORMAL LOW (ref 3.5–5.0)
Alkaline Phosphatase: 176 U/L — ABNORMAL HIGH (ref 38–126)
Anion gap: 8 (ref 5–15)
BUN: 11 mg/dL (ref 8–23)
CO2: 30 mmol/L (ref 22–32)
Calcium: 7.6 mg/dL — ABNORMAL LOW (ref 8.9–10.3)
Chloride: 95 mmol/L — ABNORMAL LOW (ref 98–111)
Creatinine, Ser: 0.89 mg/dL (ref 0.44–1.00)
GFR, Estimated: >60 mL/min (ref >60)
Glucose, Bld: 106 mg/dL — ABNORMAL HIGH (ref 70–99)
Potassium: 4 mmol/L (ref 3.5–5.1)
Sodium: 133 mmol/L — ABNORMAL LOW (ref 135–145)
Total Bilirubin: 0.6 mg/dL (ref 0.3–1.2)
Total Protein: 5 g/dL — ABNORMAL LOW (ref 6.5–8.1)

## 2020-05-01 LAB — GLUCOSE, CAPILLARY
Glucose-Capillary: 104 mg/dL — ABNORMAL HIGH (ref 70–99)
Glucose-Capillary: 97 mg/dL (ref 70–99)
Glucose-Capillary: 91 mg/dL (ref 70–99)
Glucose-Capillary: 101 mg/dL — ABNORMAL HIGH (ref 70–99)
Glucose-Capillary: 96 mg/dL (ref 70–99)
Glucose-Capillary: 95 mg/dL (ref 70–99)

## 2020-05-01 LAB — CBC
HCT: 28.1 % — ABNORMAL LOW (ref 36.0–46.0)
Hemoglobin: 8.9 g/dL — ABNORMAL LOW (ref 12.0–15.0)
MCH: 29.4 pg (ref 26.0–34.0)
MCHC: 31.7 g/dL (ref 30.0–36.0)
MCV: 92.7 fL (ref 80.0–100.0)
Platelets: 231 10*3/uL (ref 150–400)
RBC: 3.03 MIL/uL — ABNORMAL LOW (ref 3.87–5.11)
RDW: 24.2 % — ABNORMAL HIGH (ref 11.5–15.5)
WBC: 13.7 10*3/uL — ABNORMAL HIGH (ref 4.0–10.5)
nRBC: 0 % (ref 0.0–0.2)

## 2020-05-01 LAB — BPAM RBC
Blood Product Expiration Date: 202111062359
Blood Product Expiration Date: 202111272359
ISSUE DATE / TIME: 202111020556
Unit Type and Rh: 5100
Unit Type and Rh: 5100

## 2020-05-01 NOTE — Care Management Important Message (Signed)
Important Message  Patient Details  Name: Michelle Avila MRN: [id number] Date of Birth: 1945-07-15   Medicare Important Message Given:  Yes     Barb Merino Hue Steveson 05/01/2020, 2:18 PM

## 2020-05-01 NOTE — TOC Progression Note (Addendum)
Transition of Care [facility]) - Progression Note    Patient Details  Name: Michelle Avila MRN: [id number] Date of Birth: [birth year]  Transition of Care [facility]) CM/SW [place], RN Phone Number: 05/01/2020, 11:13 AM  Clinical Narrative:    Case management spoke with the patient's daughter, Michelle Avila, and she was given a list of the available beds and will return my call this afternoon regarding choice so that I can start insurance authorization with [facility].  Patient is not vaccinated.  Will start insurance authorization this afternoon.  05/01/2020-  1130 - I called [place] and started insurance authorization on the patient with a pending SNF facility choice from the daughter.  [facility] ref# [id number] and clinicals were faxed to [phone].  I will continue to follow the patient for bed choice from the daughter and insurance authorization approval through [facility].   Expected Discharge Plan: [place] Barriers to Discharge: Continued Medical Work up  Expected Discharge Plan and Services Expected Discharge Plan: [place]   Discharge Planning Services: CM Consult Post Acute Care Choice: [place] Living arrangements for the past 2 months: Single Family Home                                       Social Determinants of Health (SDOH) Interventions    Readmission Risk Interventions Readmission Risk Prevention Plan 04/30/2020  Transportation Screening Complete  PCP or Specialist Appt within 5-7 Days Complete  Home Care Screening Complete  Medication Review (RN CM) Complete  Some recent data might be hidden

## 2020-05-01 NOTE — Progress Notes (Signed)
  Progress Note    05/01/2020 7:59 AM 3 Days Post-Op  Subjective:  No major complaints this morning. Dressings were saturated in urine and per RN patient was not cooperative to allow them to be changed    Vitals:   04/30/20 2009 05/01/20 0300  BP: (!) 111/57 110/60  Pulse: 80 75  Resp: 18 17  Temp: 97.6 F (36.4 C) 97.6 F (36.4 C)  SpO2: 97% 96%   Physical Exam: Cardiac:  regular Lungs: non labored Incisions:  Right AKA stump intact and dry. She does have some epidermolysis on the right lateral to posterior aspect of AKA. I applied non adhesive gauze and kerlix wrap. Otherwise flaps currently appear viable. Some ecchymosis present on posterior flap     Extremities: 2+ femoral pulses bilaterally. Left leg in heel protector boot Neurologic: alert and oriented   CBC    Component Value Date/Time   WBC 13.7 (H) 05/01/2020 0152   RBC 3.03 (L) 05/01/2020 0152   HGB 8.9 (L) 05/01/2020 0152   HCT 28.1 (L) 05/01/2020 0152   PLT 231 05/01/2020 0152   MCV 92.7 05/01/2020 0152   MCH 29.4 05/01/2020 0152   MCHC 31.7 05/01/2020 0152   RDW 24.2 (H) 05/01/2020 0152   LYMPHSABS 0.2 (L) 04/30/2020 1442   MONOABS 0.2 04/30/2020 1442   EOSABS 0.2 04/30/2020 1442   BASOSABS 0.0 04/30/2020 1442    BMET    Component Value Date/Time   NA 133 (L) 05/01/2020 0152   K 4.0 05/01/2020 0152   CL 95 (L) 05/01/2020 0152   CO2 30 05/01/2020 0152   GLUCOSE 106 (H) 05/01/2020 0152   BUN 11 05/01/2020 0152   CREATININE 0.89 05/01/2020 0152   CALCIUM 7.6 (L) 05/01/2020 0152   GFRNONAA >60 05/01/2020 0152    INR    Component Value Date/Time   INR 1.6 (H) 04/14/2020 2316     Intake/Output Summary (Last 24 hours) at 05/01/2020 0759 Last data filed at 05/01/2020 0600 Gross per 24 hour  Intake 840 ml  Output 1300 ml  Net -460 ml     Assessment/Plan:  74 y.o. female is s/p right AKA 3 Days Post-Op. Minimal pain.  Anemia improving Plts 231 this morning and Hgb 8.9. Otherwise  hemodynamically stable. Patient is pending d/c to SNF. Will need to continue to monitor AKA for adequate healing. She will follow up in 4 weeks with Dr. Stanford Breed for suture/staple removal    Karoline Caldwell, PA-C [facility] [phone] 05/01/2020 7:59 AM

## 2020-05-01 NOTE — Progress Notes (Signed)
Pt refused dressing changes this shift, will continue to try until shift change. Current dressings are clean dry and intact, dressing to incision was changed this AM by provider.

## 2020-05-01 NOTE — Progress Notes (Signed)
Physical Therapy Treatment Patient Details Name: Michelle Avila MRN: [id number] DOB: 12-Jan-1946 Today's Date: 05/01/2020    History of Present Illness Pt adm 10/16 with septic and hypovolemic shock secondary to Flavobacterium Odoratum bacteremia with likely possible concomitant pneumonia. Pt with acute encephalopathy.  Pt was found at home on the floor by her daughter who came from [place] after her neighbors had not seen or heard from her in several days. 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 PMH - hypothyroidism.    PT Comments    Pt presenting with bilateral LE weakness, continued impaired sitting balance, and decreased overall activity tolerance. Pt required max +2 assist for bed mobility tasks this day, and declines even attempting transfer to standing, stating "that is enough" once sitting EOB. PT continuing to recommend SNF level of care post-acutely, to progress mobility and activity tolerance.    Follow Up Recommendations  SNF;Supervision/Assistance - 24 hour     Equipment Recommendations  Wheelchair (measurements PT);Wheelchair cushion (measurements PT)    Recommendations for Other Services       Precautions / Restrictions Precautions Precautions: Fall Precaution Comments: BLE weeping, R>L Restrictions Weight Bearing Restrictions: Yes RLE Weight Bearing: Non weight bearing (R AKA)    Mobility  Bed Mobility Overal bed mobility: Needs Assistance Bed Mobility: Rolling;Supine to Sit;Sit to Supine Rolling: Max assist   Supine to sit: Max assist;+2 for physical assistance Sit to supine: Max assist;+2 for physical assistance   General bed mobility comments: Max +2 for rolling bilaterally for pad replacement as pad soiled from weeping LE wounds, max +2 supine<>sit for trunk and LE management, scooting to and from EOB.  Transfers                 General transfer comment: Pt declines all attempts  Ambulation/Gait                  Stairs             Wheelchair Mobility    Modified Rankin (Stroke Patients Only)       Balance Overall balance assessment: Needs assistance Sitting-balance support: Bilateral upper extremity supported Sitting balance-Leahy Scale: Fair Sitting balance - Comments: able to sit EOB without PT assist, x5 minutes                                    Cognition Arousal/Alertness: Awake/alert Behavior During Therapy: WFL for tasks assessed/performed Overall Cognitive Status: No family/caregiver present to determine baseline cognitive functioning Area of Impairment: Attention;Following commands;Safety/judgement;Problem solving                   Current Attention Level: Selective   Following Commands: Follows one step commands consistently Safety/Judgement: Decreased awareness of safety;Decreased awareness of deficits   Problem Solving: Requires verbal cues;Difficulty sequencing;Decreased initiation General Comments: Pt self-limiting mobility this session, requires mod encouragement to mobilize. Pt refuses standing, states "no, I am not doing it, that is enough"      Exercises General Exercises - Lower Extremity Long Arc Quad: AROM;Left;5 reps;Seated Amputee Exercises Hip Extension: AAROM;Right;5 reps;Supine    General Comments General comments (skin integrity, edema, etc.): 2LO2, SpO2 89% and greater during mobility. x2 coughs, wet breath sounds      Pertinent Vitals/Pain Pain Assessment: Faces Faces Pain Scale: Hurts a little bit Pain Location: R residual limb Pain Descriptors / Indicators: Grimacing;Guarding Pain Intervention(s): Limited  activity within patient's tolerance;Monitored during session;Repositioned    Home Living                      Prior Function            PT Goals (current goals can now be found in the care plan section) Acute Rehab PT Goals PT Goal Formulation: With patient Time For Goal Achievement:  05/13/20 Potential to Achieve Goals: Fair Progress towards PT goals: Progressing toward goals    Frequency    Min 2X/week      PT Plan Current plan remains appropriate    Co-evaluation              AM-PAC PT "6 Clicks" Mobility   Outcome Measure  Help needed turning from your back to your side while in a flat bed without using bedrails?: A Lot Help needed moving from lying on your back to sitting on the side of a flat bed without using bedrails?: Total Help needed moving to and from a bed to a chair (including a wheelchair)?: Total Help needed standing up from a chair using your arms (e.g., wheelchair or bedside chair)?: Total Help needed to walk in hospital room?: Total Help needed climbing 3-5 steps with a railing? : Total 6 Click Score: 7    End of Session   Activity Tolerance: Patient limited by fatigue;Patient limited by pain Patient left: with call bell/phone within reach;in bed;with bed alarm set;with family/visitor present Nurse Communication: Mobility status PT Visit Diagnosis: Difficulty in walking, not elsewhere classified (R26.2);Muscle weakness (generalized) (M62.81)     Time: 8921-1941 PT Time Calculation (min) (ACUTE ONLY): 18 min  Charges:  $Therapeutic Activity: 8-22 mins                     Malikiah Debarr E, PT Acute Rehabilitation Services Pager [phone]  Office [phone]     Regan Llorente D Arriana Lohmann 05/01/2020, 10:45 AM

## 2020-05-01 NOTE — Plan of Care (Signed)

## 2020-05-01 NOTE — Progress Notes (Signed)
Daily Progress Note   Patient Name: Michelle Avila       Date: 05/01/2020 DOB: 1945/12/16  Age: 74 y.o. MRN#: [id number] Attending Physician: Little Ishikawa, MD Primary Care Physician: Street, Sharon Mt, MD Admit Date: 04/12/2020  Reason for Consultation/Follow-up: Establishing goals of care  Subjective: I saw and examined patient today.  She was sleepy but aroused and Ingal in my presence.  She then went back to sleep.  I met with her daughter, [name], at the bedside.  [name] reports that her mother continues to have some periods of being confused but overall her mental status is improved from when she presented to the hospital.  We discussed again regarding care plan to transition to skilled facility for rehab followed by eventual moving to [place] to be closer to [place].  We reviewed a MOST form again today.  [name] believes it would be beneficial to have this completed, particularly if she is going to be returning to her home in [place] when her mother goes to skilled facility.  We discussed options for care and talked through the MOST form together.  She would like to reviewed again with her mom so they can make decisions together, but today we discussed DNR, limited additional interventions, IV antibiotics and fluids if indicated, and no feeding tube.  Length of Stay: 18  Current Medications: Scheduled Meds:  . sodium chloride   Intravenous Once  . sodium chloride   Intravenous Once  . sodium chloride   Intravenous Once  . sodium chloride   Intravenous Once  . Chlorhexidine Gluconate Cloth  6 each Topical Daily  . feeding supplement  237 mL Oral BID BM  . furosemide  40 mg Oral BID  . guaiFENesin  1,200 mg Oral BID  . levothyroxine  112 mcg Oral Q0600  .  mouth rinse  15 mL Mouth Rinse BID  . multivitamin with minerals  1 tablet Oral Daily  . potassium chloride  40 mEq Oral BID  . sodium chloride flush  10-40 mL Intracatheter Q12H    Continuous Infusions: . sodium chloride 250 mL (04/24/20 1835)    PRN Meds: acetaminophen, docusate sodium, HYDROmorphone (DILAUDID) injection, ipratropium, levalbuterol, ondansetron (ZOFRAN) IV, oxyCODONE, polyethylene glycol, Resource ThickenUp Clear, sodium chloride flush  Physical Exam    Sleepy but arousable.  Frail and chronically ill appearing Regular work of breathing Has edema Abdomen not distended.  AKA noted       Vital Signs: BP 116/65 (BP Location: Left Arm)   Pulse 79   Temp 98.2 F (36.8 C) (Oral)   Resp 17   Ht 5' 5" (1.651 m)   Wt 50.9 kg   SpO2 98%   BMI 18.67 kg/m  SpO2: SpO2: 98 % O2 Device: O2 Device: Room Air O2 Flow Rate: O2 Flow Rate (L/min): 2 L/min  Intake/output summary:   Intake/Output Summary (Last 24 hours) at 05/01/2020 1755 Last data filed at 05/01/2020 1700 Gross per 24 hour  Intake 600 ml  Output 1350 ml  Net -750 ml   LBM: Last BM Date: 04/25/20 Baseline Weight: Weight: 45.4 kg Most recent weight: Weight: 50.9 kg       Palliative Assessment/Data:    Flowsheet Rows     Most Recent Value  Intake Tab  Referral Department Hospitalist  Unit at Time of Referral Other (Comment)  [urology/telementry]  Palliative Care Primary Diagnosis Sepsis/Infectious Disease  Date Notified 04/18/20  Palliative Care Type New Palliative care  Reason for referral Clarify Goals of Care  Date of Admission 04/12/20  Date first seen by Palliative Care 04/19/20  # of days Palliative referral response time 1 Day(s)  # of days IP prior to Palliative referral 6  Clinical Assessment  Psychosocial & Spiritual Assessment  Palliative Care Outcomes      Patient Active Problem List   Diagnosis Date Noted  . Protein-calorie malnutrition, severe 04/28/2020  . Pulmonary  infiltrates on CXR 04/19/2020  . Altered mental status   . Thrombocytopenia ([place])   . Dehydration 04/13/2020  . Shock ([place]) 04/13/2020  . Septic shock ([place]) 04/13/2020  . Pressure injury of skin 04/13/2020    Palliative Care Assessment & Plan  Recommendations/Plan: -I discussed with her daughter, [name], regarding goals of care.  She agrees that her mother would benefit from completion of a MOST form prior to discharge, particularly as she is going to go to rehab here in [place] while her daughter returns to [place].  Eventually, her daughter would like to move her to [place] to be closer to her when she discharges from rehab. -I left a copy of a MOST form as well as a copy of hard choices for loving people for [name] to review. -Her daughter will call after they have had a chance to review MOST form together and I will plan on stopping by to complete it with them after they have a chance to discuss it together.    Code Status:    Code Status Orders  (From admission, onward)         Start     Ordered   04/16/20 1544  Do not attempt resuscitation (DNR)  Continuous       Question Answer Comment  In the event of cardiac or respiratory ARREST Do not call a "code blue"   In the event of cardiac or respiratory ARREST Do not perform Intubation, CPR, defibrillation or ACLS   In the event of cardiac or respiratory ARREST Use medication by any route, position, wound care, and other measures to relive pain and suffering. May use oxygen, suction and manual treatment of airway obstruction as needed for comfort.      04/16/20 1543        Code Status History    Date Active Date Inactive Code Status Order ID Comments User Context  04/13/2020 0213 04/16/2020 1543 Full Code [id number]  Shellia Cleverly, MD ED   Advance Care Planning Activity      Discharge Planning: [facility] for rehab with Palliative care service follow-up  Care plan was discussed with patient's  daughter   Thank you for allowing the Palliative Medicine Team to assist in the care of this patient.   Time In: 1310 Time Out: 1350 Total Time 40 Prolonged Time Billed No  Greater than 50%  of this time was spent counseling and coordinating care related to the above assessment and plan.  [name], MD  Please contact Palliative Medicine Team phone at [phone] for questions and concerns.

## 2020-05-01 NOTE — Progress Notes (Signed)
  Speech Language Pathology Treatment: Dysphagia  Patient Details Name: Michelle Avila MRN: [id number] DOB: February 26, 1946 Today's Date: 05/01/2020 Time: 1205-1218 SLP Time Calculation (min) (ACUTE ONLY): 13 min  Assessment / Plan / Recommendation Clinical Impression  Pt was seen for dysphagia treatment with her daughter present. Pt, nursing, and her daughter reported that the pt has been tolerating the current diet without overt s/sx of aspiration. Pt's daughter reported that the pt's p.o. intake continues to be inadequate and indicated that she intends to order some food from a local restaurant to help encourage her to eat. Pt tolerated regular texture solids, and thin liquids via straw using consecutive swallows without symptoms of oropharyngeal dysphagia. It is recommended that the current diet be continued. Further skilled SLP services are not clinically indicated at this time.    HPI HPI: Pt presented with AMS, was found down covered in feces at home by her neighbors.  PMH for smoking, Pneumonitis due to inhalation of food and vomit in 2016, Pressure ulcer of sacral region, stage 1, Acute respiratory failure with hypoxia, protein-calorie malnutrition, bacterial pneumonia, was living alone, Diarrhea, Fluid overload, Altered mental status, Klebsiella pneumoniae (k. pneumoniae), Pure hypercholesterolemia, Essential (primary) hypertension, Hypotension, Tobacco use, bilirubin metabolism, Escherichia coli (E. coli), , Hypomagnesemia, Hypokalemia, Anemia, unspecified, Body mass index (BMI) 19.9 or less, adult malaise.      SLP Plan  All goals met;Discharge SLP treatment due to (comment)       Recommendations  Diet recommendations: Regular;Thin liquid Liquids provided via: Cup;Straw Medication Administration: Whole meds with puree Supervision: Patient able to self feed;Staff to assist with self feeding;Full supervision/cueing for compensatory strategies Compensations: Slow rate;Small  sips/bites Postural Changes and/or Swallow Maneuvers: Seated upright 90 degrees;Upright 30-60 min after meal                Oral Care Recommendations: Oral care BID Follow up Recommendations: Skilled Nursing facility;24 hour supervision/assistance SLP Visit Diagnosis: Dysphagia, pharyngoesophageal phase (R13.14);Dysphagia, oropharyngeal phase (R13.12) Plan: All goals met;Discharge SLP treatment due to (comment)       Markon Jares I. Hardin Negus, [place], [place] Office number [phone] Pager [place] 05/01/2020, 1:37 PM

## 2020-05-01 NOTE — Progress Notes (Signed)
Nutrition Follow-up  DOCUMENTATION CODES:   Severe malnutrition in context of chronic illness  INTERVENTION:   -Continue with liberalized diet of regular -Continue Ensure Enlive po BID, each supplement provides 350 kcal and 20 grams of protein -Continue Magic cup BID with meals, each supplement provides 290 kcal and 9 grams of protein -Continue MVI with minerals daily  NUTRITION DIAGNOSIS:   Severe Malnutrition related to chronic illness as evidenced by moderate fat depletion, moderate muscle depletion, severe fat depletion, severe muscle depletion.  Ongoing  GOAL:   Patient will meet greater than or equal to 90% of their needs  Progressing   MONITOR:   PO intake, Supplement acceptance, Labs, Weight trends, I & O's, Skin  REASON FOR ASSESSMENT:   Consult Assessment of nutrition requirement/status, Poor PO  ASSESSMENT:   74 year old chronically ill-appearing cachectic Caucasian female with a past medical history significant for hypothyroidism who presented with altered mental status, hypoglycemia, dehydration.  From report neighbors had not seen or heard from her in several days and daughter came in from out of state and found the patient on the floor in feces and was very altered.  11/1- s/p Procedure(s): AMPUTATION ABOVE KNEE (Right) 11/3- s/p BSE- advanced to regular diet  Reviewed I/O's: -460 ml x 24 hours and -5.9 L since 04/17/20  UOP: 1.3L x 24 hours  Pt unavailable at time of visit. Per SLP, diet was upgraded yesterday for increased meal selections. Pt tolerating current diet consistency well.   Intake remains poor; PO: 5-25%. Pt mostly taking Ensure Enlive supplements. Daughter has also been bringing in outside food for pt to eat.   Per TOC notes, plan to d/c to SNF once medically stable.   Labs reviewed: CBGS: 91-101.   Diet Order:   Diet Order            Diet regular Room service appropriate? Yes with Assist; Fluid consistency: Thin  Diet effective  now                 EDUCATION NEEDS:   No education needs have been identified at this time  Skin:  Skin Assessment: Skin Integrity Issues: Skin Integrity Issues:: DTI, Stage III, Other (Comment) DTI: lt knee Stage III: sacrum Other: skin tear lt lower arm, lt thigh, rt thigh; s/p rt AKA  Last BM:  04/25/20  Height:   Ht Readings from Last 1 Encounters:  04/13/20 5\' 5"  (1.651 m)    Weight:   Wt Readings from Last 1 Encounters:  04/23/20 50.9 kg    Ideal Body Weight:  52.3 kg (adjusted for rt AKA)  BMI:  Body mass index is 18.67 kg/m.  Estimated Nutritional Needs:   Kcal:  1650-1850  Protein:  90-105 grams  Fluid:  > 1.6 L    Loistine Chance, RD, LDN, [place] Registered Dietitian II Certified Diabetes Care and Education Specialist Please refer to [facility] for RD and/or RD on-call/weekend/after hours pager

## 2020-05-01 NOTE — Progress Notes (Signed)
PROGRESS NOTE    Michelle Avila  QQI:[id number] DOB: 08-11-1945 DOA: 04/12/2020 PCP: Venetia Maxon, Sharon Mt, MD   Brief Narrative:  The patient is a 74 year old female with PMH for hypothyroidism who presented with AMS, hypoglycemia, dehydration. From report neighbors had not seen or heard from her in several days and daughter came in from out of state and found the patient on the floor in feces and was very altered. EMS was dispatched and upon evaluation her blood sugar was 19 and so they gave her D10. She was brought to the ED and found to be dehydrated and given 2-1/2 L of IV fluids. CT head was obtained without acute changes. Blood and urine cultures were ordered but the ED felt the picture was more consistent with dehydration and hypoglycemia rather than infection so she was not started on antibiotics at that time. Her mentation largely improved in the ED and she endorsed a recent med change and was placed on a diuretic for her bilateral lower extremity edema.  Further work-up revealed that she ended up going into septic shock and hypovolemic shock from a gram-negative bacteremia likely from being in her feces and possible colitis. She was started on IV Zosyn briefly then changed to Levaquin and then to ciprofloxacin. She was placed on pressors and had encephalopathy and electrolyte derangements. They were adjusted and corrected and she was given albumin and stabilized in the ICU.  She had some dysphagia so she was n.p.o. and SLP evaluated and they are now recommending a dysphagia 2 diet with nectar thick liquids.  She is transferred to [facility] service 04/18/2020. During her hospitalization she had critical illness thrombocytopenia and anemia. She gets transfusions as needed while bone marrow recovers from sepsis.   Patient's platelet count continues to drop-she received multiple platelet transfusion and 1 unit PRBC transfusion.  Palliative care was consulted for goals of care  discussion.  Thrombocytopenia was also discussed with Dr. Alen Blew who feels that this is secondary to her septic shock and her bacteremia and recommend supportive care at this time and transfusions.   Due to her ongoing right foot wound/mummification, a consult with ortho and vascular surgery was pursued. She will likely need a right AKA and intense wound care with her LLE therefore she transferred to [facility] on 10/28..   Assessment & Plan:   Nonsalvageable right lower extremity: -S/p right AKA, POD 2 by vascular surgery-Dr. Stanford Breed -Oxycodone/Dilaudid as needed for pain control. -Received 2 unit PRBC perioperatively -H&H this morning trended down from 7.1-6.2.  Status post 1 unit PRBC transfusion. -H&H improved from 6.2-7.9 after 1 unit PRBC transfusion -Repeat CBC is pending. -Appreciate vascular surgery help.  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 -PT OT evaluation done and recommending SNF  left leg wounds -Patient also has extensive excoriations and wounds noted on left lower extremity.  -Wound care  Acute Encephalopathy: Resolved -Likely multifactorial including her severe hypoglycemia, dehydration, septic shock from colitis and bacteremia -CT head negative for acute findings. -Continue delirium precautions  Volume overload : Improving  -in the setting of volume resuscitation as well as acute on chronic diastolic CHF -Echocardiogram showed a normal EF but  grade 2 diastolic dysfunction -BNP was elevated at 1,113.0 and improved to 437.2 -Strict I's and O's and daily weights -Continue  Lasix  Acute Respiratory Failure with Hypoxia: Improving -Continue diuresis.  Patient finished Levaquin for 14 days. -continue pulmonary toileting, guaifenesin 1200 mg p.o. twice daily, flutter valve, incentive spirometer, Xopenex and Atrovent breathing treatments -On as needed oxygen  Dysphagia -SLP evaluated and recommended dysphagia 2 nectar thick diet with allowing teaspoon of thins   Severe Protein Calorie Malnutrition/Failure to Thrive -Continue on a dysphagia 2 diet  Hypothyroidism -Patient's TSH was 9.921 and free T4 0.67 -Continue levothyroxine  Abnormal LFTs, improving -Reviewed liver Doppler done on 10/18  -Continue monitor and trend and repeat CMP in a.m.  Thrombocytopenia -In the Setting of Septic Shock and her gram-negative bacteremia. oncology feels that this is secondary to her infection and not related to HUS TTP or ITP -s/p 5 units PLT for admission -continue holding chemical DVT ppx -Platelet count: Improving.  136  Hypomagnesemia: Resolved  Normocytic anemia/anemia of critical illness - baseline roughtly 8-9 trended down to 6.2 after the procedure.  Status post 1 unit PRBC transfusion on 11/2. -See above  DVT prophylaxis: SCD  code Status: DNR Family Communication: Daughter at bedside, lengthy discussion about patient's prognosis, need for disposition and placement. Patient unhappy but understands the reasoning for discharge to rehab, agreeable for discharge once bed has been made available.  Disposition Plan: SNF/inpatient rehab  Consultants:   PCCM  Vascular  Orthopedic  Palliative care  Procedures:   Echo  Right AKA  Antimicrobials:   As above  Status is: Inpatient  Dispo: The patient is from: Home              Anticipated d/c is to: SNF              Anticipated d/c date is: 05/02/2020              Patient currently not medically stable for the discharge  Subjective: No acute issues or events overnight, resting comfortably  in bed, tolerating breakfast, denies chest pain, shortness of breath, nausea, vomiting, diarrhea, constipation, headache, fevers, chills.  Objective: Vitals:   04/30/20 0531 04/30/20 1300 04/30/20 2009 05/01/20 0300  BP: (!) 105/53 106/60 (!) 111/57 110/60  Pulse: 76 77 80 75  Resp: 16 16 18 17   Temp: 98.6 F (37 C) 98.3 F (36.8 C) 97.6 F (36.4 C) 97.6 F (36.4 C)  TempSrc: Oral Oral Oral Oral  SpO2: 96% 97% 97% 96%  Weight:      Height:        Intake/Output Summary (Last 24 hours) at 05/01/2020 0744 Last data filed at 05/01/2020 0600 Gross per 24 hour  Intake 840 ml  Output 1300 ml  Net -460 ml   Filed Weights   04/20/20 0500 04/22/20 0500 04/23/20 0500  Weight: 54 kg 51.2 kg 50.9 kg    Examination: General exam: Appears calm and comfortable, communicating well Respiratory system: Clear to auscultation. Respiratory effort normal. Cardiovascular system: S1 & S2 heard, RRR. No JVD, murmurs, rubs, gallops or clicks. No pedal edema. Gastrointestinal system: Abdomen is nondistended, soft and nontender. No organomegaly or masses felt. Normal bowel sounds heard. Central nervous system: Alert and oriented. No focal neurological deficits. 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  Left leg: Dressing dry intact. Psychiatry: Judgement and insight appear normal. Mood & affect appropriate.  Data Reviewed: I have personally reviewed following labs and imaging studies  CBC: Recent Labs  Lab 04/25/20 0415 04/26/20 0413 04/27/20 0423 04/27/20 0423 04/28/20 0346 04/29/20 0400 04/29/20 1600 04/30/20 1442 05/01/20 0152  WBC 10.2   < > 10.6*   < > 9.2 10.2 11.0* 15.2* 13.7*  NEUTROABS 8.6*  --  9.2*  --  7.8* 8.8*  --  14.7*  --   HGB 7.5*   < > 8.0*   < > 7.1* 6.2* 7.9* 9.1* 8.9*  HCT 24.6*   < > 26.8*   < > 23.7* 21.4* 25.1* 29.2* 28.1*  MCV 89.8   < > 89.9   < > 92.9 96.4 90.3 92.4 92.7  PLT 49*   < > 63*   < > 82* 119* 136* 185 231   < > =  values in this interval not displayed.   Basic Metabolic Panel: Recent Labs  Lab 04/25/20 0415 04/25/20 2356 04/26/20 1100 04/26/20 1541 04/27/20 0423 04/27/20 0825 04/28/20 0346 04/28/20 1224 04/29/20 1200 04/29/20 1600 04/30/20 0024 04/30/20 1442 05/01/20 0152  NA 143  --  142  --  139  --  139  --   --   --   --  135 133*  K 4.1  --  4.8  --  4.5  --  4.1  --   --   --   --  4.2 4.0  CL 99  --  100  --  99  --  99  --   --   --   --  97* 95*  CO2 33*  --  32  --  33*  --  33*  --   --   --   --  32 30  GLUCOSE 81   < > 98   < > 84   < > 77   < > 84 265* 109* 108* 106*  BUN 21  --  14  --  11  --  14  --   --   --   --  13 11  CREATININE 0.92  --  0.94  --  0.96  --  1.06*  --   --   --   --  0.99 0.89  CALCIUM 7.3*  --  7.8*  --  7.8*  --  7.6*  --   --   --   --  7.7* 7.6*  MG 1.4*  --  1.4*  --  2.4  --  2.4  --   --   --   --   --   --   PHOS 3.3  --   --   --  3.8  --  4.5  --   --   --   --   --   --    < > = values in this interval not displayed.   GFR: Estimated Creatinine Clearance: 43.9 mL/min (by C-G formula based on SCr of 0.89 mg/dL). Liver Function Tests: Recent Labs  Lab 04/26/20 1100 04/27/20 0423 04/28/20 0346 04/30/20 1442 05/01/20 0152  AST 34 27 23 21 18   ALT 81* 32 26 18 17   ALKPHOS 298* 269* 217* 186* 176*  BILITOT 0.8 0.9 1.0 0.6 0.6  PROT 5.4* 5.2* 4.7* 5.1* 5.0*  ALBUMIN 1.8* 1.7* 1.5* 1.5* 1.5*   No results for input(s): LIPASE, AMYLASE in the last 168 hours. No results for input(s): AMMONIA in the last 168 hours. Coagulation Profile: No results for input(s): INR, PROTIME in the last 168 hours. Cardiac Enzymes: No results for  input(s): CKTOTAL, CKMB, CKMBINDEX, TROPONINI in the last 168 hours. BNP (last 3 results) No results for input(s): PROBNP in the last 8760 hours. HbA1C: No results for input(s): HGBA1C in the last 72 hours. CBG: Recent Labs  Lab 04/30/20 1636 04/30/20 2034 05/01/20 0149 05/01/20 0436 05/01/20 0639   GLUCAP 85 101* 104* 97 91   Lipid Profile: No results for input(s): CHOL, HDL, LDLCALC, TRIG, CHOLHDL, LDLDIRECT in the last 72 hours. Thyroid Function Tests: No results for input(s): TSH, T4TOTAL, FREET4, T3FREE, THYROIDAB in the last 72 hours. Anemia Panel: No results for input(s): VITAMINB12, FOLATE, FERRITIN, TIBC, IRON, RETICCTPCT in the last 72 hours. Sepsis Labs: No results for input(s): PROCALCITON, LATICACIDVEN in the last 168 hours.  No results found for this or any previous visit (from the past 240 hour(s)).    Radiology Studies: No results found.  Scheduled Meds: . sodium chloride   Intravenous Once  . sodium chloride   Intravenous Once  . sodium chloride   Intravenous Once  . sodium chloride   Intravenous Once  . Chlorhexidine Gluconate Cloth  6 each Topical Daily  . feeding supplement  237 mL Oral BID BM  . furosemide  40 mg Oral BID  . guaiFENesin  1,200 mg Oral BID  . levothyroxine  112 mcg Oral Q0600  . mouth rinse  15 mL Mouth Rinse BID  . multivitamin with minerals  1 tablet Oral Daily  . potassium chloride  40 mEq Oral BID  . sodium chloride flush  10-40 mL Intracatheter Q12H   Continuous Infusions: . sodium chloride 250 mL (04/24/20 1835)     LOS: 18 days   Time spent: 40 minutes   Little Ishikawa, DO [facility]  If 7PM-7AM, please contact night-coverage [email or web address] 05/01/2020, 7:44 AM

## 2020-05-02 LAB — CBC WITH DIFFERENTIAL/PLATELET
Abs Immature Granulocytes: 0.09 10*3/uL — ABNORMAL HIGH (ref 0.00–0.07)
Basophils Absolute: 0.1 10*3/uL (ref 0.0–0.1)
Basophils Relative: 1 %
Eosinophils Absolute: 0.1 10*3/uL (ref 0.0–0.5)
Eosinophils Relative: 0 %
HCT: 28.9 % — ABNORMAL LOW (ref 36.0–46.0)
Hemoglobin: 8.9 g/dL — ABNORMAL LOW (ref 12.0–15.0)
Immature Granulocytes: 1 %
Lymphocytes Relative: 4 %
Lymphs Abs: 0.5 10*3/uL — ABNORMAL LOW (ref 0.7–4.0)
MCH: 29.1 pg (ref 26.0–34.0)
MCHC: 30.8 g/dL (ref 30.0–36.0)
MCV: 94.4 fL (ref 80.0–100.0)
Monocytes Absolute: 1.2 10*3/uL — ABNORMAL HIGH (ref 0.1–1.0)
Monocytes Relative: 10 %
Neutro Abs: 9.8 10*3/uL — ABNORMAL HIGH (ref 1.7–7.7)
Neutrophils Relative %: 84 %
Platelets: 284 10*3/uL (ref 150–400)
RBC: 3.06 MIL/uL — ABNORMAL LOW (ref 3.87–5.11)
RDW: 24.2 % — ABNORMAL HIGH (ref 11.5–15.5)
WBC: 11.6 10*3/uL — ABNORMAL HIGH (ref 4.0–10.5)
nRBC: 0 % (ref 0.0–0.2)

## 2020-05-02 LAB — SARS CORONAVIRUS 2 BY RT PCR (HOSPITAL ORDER, PERFORMED IN ~~LOC~~ HOSPITAL LAB): SARS Coronavirus 2: NEGATIVE

## 2020-05-02 MED ORDER — POTASSIUM CHLORIDE 20 MEQ/15ML (10%) PO SOLN: 40.0000 meq | Freq: Two times a day (BID) | ORAL | 0 refills | Status: AC

## 2020-05-02 MED ORDER — OXYCODONE HCL 10 MG PO TABS: 10.0000 mg | ORAL_TABLET | ORAL | 0 refills | Status: AC | PRN

## 2020-05-02 MED ORDER — FUROSEMIDE 40 MG PO TABS: 40.0000 mg | ORAL_TABLET | Freq: Two times a day (BID) | ORAL | 0 refills | Status: AC

## 2020-05-02 MED ORDER — DOCUSATE SODIUM 100 MG PO CAPS: 100.0000 mg | ORAL_CAPSULE | Freq: Two times a day (BID) | ORAL | 0 refills | Status: AC | PRN

## 2020-05-02 NOTE — TOC Transition Note (Signed)
Transition of Care [facility]) - CM/SW Discharge Note   Patient Details  Name: Michelle Avila MRN: [id number] Date of Birth: [birth year]  [facility] of [facility]) CM/SW Contact:  Curlene Labrum, RN Phone Number: 05/02/2020, 3:43 PM   Clinical Narrative:    [place] called back and approved the patient for [facility] placement at [facility] in [place], [place].  I called and spoke with the facility, [facility], [facility] and they have an available bed for the patient for transfer today.  I spoke with the daughter and she is aware that the patient is transferring to the facility.  The patient' COVID is negative.  I placed the discharge clinicals in the [facility] packet and [facility] was called for transportation to the facility. I called and notified the daughter and the patient.  Megan, RNCM will cal report to [facility] at [phone].  [facility] will transport the patient.   Final next level of care: [place] Barriers to Discharge: Continued Medical Work up   Patient Goals and CMS Choice Patient states their goals for this hospitalization and ongoing recovery are:: Patient plans to discharge to [facility] facility for rehabilitation. CMS Medicare.gov Compare Post Acute Care list provided to:: Patient Choice offered to / list presented to : [facility] POA / Guardian (daughter, [name] - [phone])  Discharge Placement                       Discharge Plan and Services   Discharge Planning Services: CM Consult Post Acute Care Choice: [place]                               Social Determinants of Health (SDOH) Interventions     Readmission Risk Interventions Readmission Risk Prevention Plan 04/30/2020  Transportation Screening Complete  PCP or Specialist Appt within 5-7 Days Complete  Home Care Screening Complete  Medication Review (RN CM) Complete  Some recent data might be hidden

## 2020-05-02 NOTE — Discharge Summary (Signed)
Physician Discharge Summary  Michelle Avila YKD:[id number] DOB: 05-02-1946 DOA: 04/12/2020  PCP: Emmaline Kluver, MD  Admit date: 04/12/2020 Discharge date: 05/02/2020  Admitted From: Home Disposition: SNF  Recommendations for Outpatient Follow-up:  1. Follow up with PCP in 1-2 weeks 2. Surgery in 1 to 2 weeks as scheduled for postop care   Discharge Condition: Stable CODE STATUS: DNR Diet recommendation: Low-salt cardiac diet as tolerated  Brief/Interim Summary: The patient is a 74 year old female with PMH for hypothyroidism who presented with AMS, hypoglycemia, dehydration. From report neighbors had not seen or heard from her in several days and daughter came in from out of state and found the patient on the floor in feces and was very altered. EMS was dispatched and upon evaluation her blood sugar was 19 and so they gave her D10. She was brought to the ED and found to be dehydrated and given 2-1/2 L of IV fluids. CT head was obtained without acute changes. Blood and urine cultures were ordered but the ED felt the picture was more consistent with dehydration and hypoglycemia rather than infection so she was not started on antibiotics at that time. Her mentation largely improved in the ED and she endorsed a recent med change and was placed on a diuretic for her bilateral lower extremity edema. Further work-up revealed that she ended up going into septic shock and hypovolemic shock from a gram-negative bacteremia likely from being in her feces and possible colitis. She was started on IV Zosyn briefly then changed to Levaquin and then to ciprofloxacin. She was placed on pressors and had encephalopathy and electrolyte derangements. They were adjusted and corrected and she was given albumin and stabilized in the ICU. She had some dysphagia so she was n.p.o. and SLP evaluated and they are now recommending a dysphagia 2 diet with nectar thick liquids.  She is transferred to [facility]  service 04/18/2020. During her hospitalization she had critical illness thrombocytopenia and anemia. She gets transfusions as needed while bone marrow recovers from sepsis. Patient's platelet count continues to drop-she received multiple platelet transfusion and 1 unit PRBC transfusion.  Palliative care was consulted for goals of care discussion.  Thrombocytopenia was also discussed with Dr. Alen Blew who feels that this is secondary to her septic shock and her bacteremia and recommend supportive care at this time and transfusions. Due to her ongoing right foot wound/mummification, a consult with ortho and vascular surgery was pursued. She will likely need a right AKA and intense wound care with her LLE therefore she transferred to [facility] on 10/28. She tolerated surgery quite well - stable for DC to SNF per PT recommendations; currently pending approval. Outpatient follow up with vascular surgery and PCP as scheduled.    Discharge Diagnoses:  Principal Problem:   Septic shock ([place]) Active Problems:   Dehydration   Shock ([place])   Pressure injury of skin   Pulmonary infiltrates on CXR   Altered mental status   Thrombocytopenia (H[facility])   Protein-calorie malnutrition, severe    Discharge Instructions  Discharge Instructions    Diet - low sodium heart healthy   Complete by: As directed    Discharge wound care:   Complete by: As directed    Cleanse wounds to sacrum and feet with NS and pat dry.  Apply Santyl to wound bed. Cover with NS moist gauze. Secure with dry gauze and dry dressing.  Change daily.   Increase activity slowly   Complete by: As directed  Allergies as of 05/02/2020   Not on File     Medication List    STOP taking these medications   torsemide 10 MG tablet Commonly known as: DEMADEX     TAKE these medications   acetaminophen 325 MG tablet Commonly known as: TYLENOL Take 650 mg by mouth every 6 (six) hours as needed for mild pain or fever.   docusate  sodium 100 MG capsule Commonly known as: COLACE Take 1 capsule (100 mg total) by mouth 2 (two) times daily as needed for mild constipation.   furosemide 40 MG tablet Commonly known as: LASIX Take 1 tablet (40 mg total) by mouth 2 (two) times daily.   levothyroxine 112 MCG tablet Commonly known as: SYNTHROID Take 112 mcg by mouth every morning.   Oxycodone HCl 10 MG Tabs Take 1 tablet (10 mg total) by mouth every 4 (four) hours as needed for up to 5 days for moderate pain.   potassium chloride 20 MEQ/15ML (10%) Soln Take 30 mLs (40 mEq total) by mouth 2 (two) times daily.   Virt-Gard 2.2-25-1 MG Tabs Generic drug: Folic Acid-Vit I7-POE U23 Take 1 tablet by mouth at bedtime.            Discharge Care Instructions  (From admission, onward)         Start     Ordered   05/02/20 0000  Discharge wound care:       Comments: Cleanse wounds to sacrum and feet with NS and pat dry.  Apply Santyl to wound bed. Cover with NS moist gauze. Secure with dry gauze and dry dressing.  Change daily.   05/02/20 1302          Follow-up Information    Cherre Robins, MD Follow up in 4 week(s).   Specialty: Vascular Surgery Why: office will call Contact information: [address] [place] [address] [phone]              Not on File  Consultations:  Vascular surgery, PCCM, orthopedics, palliative care   Procedures/Studies: CT ABDOMEN PELVIS WO CONTRAST  Result Date: 04/13/2020 CLINICAL DATA:  Sepsis. Hypovolemic and septic shock. Chronic protein calorie malnutrition. Elevated lactate. EXAM: CT ABDOMEN AND PELVIS WITHOUT CONTRAST TECHNIQUE: Multidetector CT imaging of the abdomen and pelvis was performed following the standard protocol without IV contrast. COMPARISON:  Overlapping portions of CT chest from 05/25/2015 FINDINGS: Lower chest: Small bilateral pleural effusions with passive atelectasis. Subcutaneous edema in the chest. Low-density blood pool compatible with  anemia. Mild aortic valve calcification. Hepatobiliary: Cholelithiasis. No definite gallbladder wall thickening. Perihepatic ascites. Pancreas: Indistinct margins of the pancreas although not disproportionate to the diffuse mesenteric and omental stranding. Correlate with lipase levels in assessing for pancreatitis. Spleen: Old granulomatous disease. Adrenals/Urinary Tract: Fullness of the adrenal glands without mass. Densely calcified exophytic lesion of the left kidney upper pole measuring 3.0 by 2.1 cm on image 23 of series 2, previously measuring about the same on 05/25/2015, probably a chronically calcified cyst. No hydronephrosis or hydroureter. No discrete urinary tract calculi. Stomach/Bowel: Nondistended stomach and bowel. No pneumatosis, but there is abnormal bowel wall thickening in the descending and sigmoid colon extending to the rectum. Vascular/Lymphatic: Aortoiliac atherosclerotic vascular disease. Reproductive: There is a small loculation of gas along the uterine endometrium in the fundus for example on image 58 of series 5. Endometritis is not excluded. Other: Diffuse mesenteric, omental, and especially subcutaneous edema indicating third spacing of fluid. Small amount of perihepatic, perisplenic, and pelvic  ascites. 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 Loss of disc height and endplate sclerosis at the L2-3 level likely from degenerative disc disease and degenerative endplate findings. Mild lumbar spondylosis and degenerative disc disease. IMPRESSION: 1. Abnormal bowel wall thickening in the descending and sigmoid colon extending to the rectum. In the context of elevated lactate, I cannot exclude ischemic colitis compared other forms of colitis, but no pneumatosis is currently identified. 2. Widespread third spacing of fluid with subcutaneous, mesenteric, and omental edema along with a small amount of ascites.  Small bilateral pleural effusions. 3. Small loculation of gas along the uterine endometrium in the fundus. Endometritis is not excluded. 4. Indistinct margins of the pancreas although not disproportionate to the diffuse mesenteric and omental stranding. Correlate with lipase levels in assessing for pancreatitis. 5. Low-density blood pool compatible with anemia. 6. Cholelithiasis. 7. Densely calcified exophytic lesion of the left kidney upper pole, probably a chronically calcified cyst, relatively stable from chest CT from 5 years ago. 8. 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 9. Aortic atherosclerosis. Aortic Atherosclerosis (ICD10-I70.0). Electronically Signed   By: Van Clines M.D.   On: 04/13/2020 13:16   CT HEAD WO CONTRAST  Result Date: 04/16/2020 CLINICAL DATA:  Altered mental status EXAM: CT HEAD WITHOUT CONTRAST TECHNIQUE: Contiguous axial images were obtained from the base of the skull through the vertex without intravenous contrast. COMPARISON:  04/13/2020 FINDINGS: Brain: No evidence of acute infarction, hemorrhage, hydrocephalus, extra-axial collection or mass lesion/mass effect. Scattered low-density changes within the periventricular and subcortical white matter compatible with chronic microvascular ischemic change. Mild diffuse cerebral volume loss. Vascular: Atherosclerotic calcifications involving the large vessels of the skull base. No unexpected hyperdense vessel. Skull: Normal. Negative for fracture or focal lesion. Sinuses/Orbits: No acute finding. Other: None. IMPRESSION: 1. No acute intracranial findings.  Stable exam from 04/13/2020 2. Mild chronic microvascular ischemic change and cerebral volume loss. Electronically Signed   By: Davina Poke D.O.   On: 04/16/2020 13:07   CT Head Wo Contrast  Result Date: 04/13/2020 CLINICAL DATA:  74 year old female with altered mental status. EXAM: CT HEAD WITHOUT  CONTRAST TECHNIQUE: Contiguous axial images were obtained from the base of the skull through the vertex without intravenous contrast. COMPARISON:  Head CT dated 05/09/2015. FINDINGS: Brain: Mild age-related atrophy and chronic microvascular ischemic changes. There is no acute intracranial hemorrhage. No mass effect or midline shift. No extra-axial fluid collection. Vascular: No hyperdense vessel or unexpected calcification. Skull: Normal. Negative for fracture or focal lesion. Sinuses/Orbits: No acute finding. Other: None IMPRESSION: 1. No acute intracranial pathology. 2. Mild age-related atrophy and chronic microvascular ischemic changes. Electronically Signed   By: Anner Crete M.D.   On: 04/13/2020 00:22   CT TIBIA FIBULA LEFT WO CONTRAST  Result Date: 04/13/2020 CLINICAL DATA:  Swelling and bleeding from lower leg. Osteomyelitis. EXAM: CT OF THE LOWER LEFT EXTREMITY WITHOUT CONTRAST TECHNIQUE: Multidetector CT imaging of the lower left extremity was performed according to the standard protocol. COMPARISON:  None. FINDINGS: Bones/Joint/Cartilage Diffuse bony demineralization. No focal bony destructive findings to suggest osteomyelitis. Old healed fracture of the left proximal fibula. Ligaments Suboptimally assessed by CT. Muscles and Tendons Unremarkable Soft tissues Diffuse subcutaneous edema in the calf. Atherosclerotic vascular calcifications. IMPRESSION: 1. No focal bony destructive findings to suggest osteomyelitis. 2. Diffuse bony demineralization. 3. Diffuse subcutaneous edema in the calf. 4. Old healed fracture of the left  proximal fibula. 5. Atherosclerosis. Electronically Signed   By: Van Clines M.D.   On: 04/13/2020 12:58   DG CHEST PORT 1 VIEW  Result Date: 04/23/2020 CLINICAL DATA:  Shortness of breath EXAM: PORTABLE CHEST 1 VIEW COMPARISON:  Yesterday FINDINGS: PICC on the right with tip at the upper cavoatrial junction. Hazy opacification of the left more than right chest from  pleural fluid and presumed atelectasis. Left-sided effusion appears moderate or large. No visible pneumothorax. Normal heart size. Granulomatous nodal calcifications. IMPRESSION: Pleural effusions with opacified or obscured lungs. The left effusion appears moderate or large. Electronically Signed   By: Monte Fantasia M.D.   On: 04/23/2020 08:03   DG CHEST PORT 1 VIEW  Result Date: 04/22/2020 CLINICAL DATA:  Shortness of breath EXAM: PORTABLE CHEST 1 VIEW COMPARISON:  April 21, 2020 FINDINGS: The patient's mandible obscures portions of the left upper lobe. There is a persistent fairly sizable left pleural effusion with a small right pleural effusion. There is atelectasis with suspected consolidation in portions of the left lower lobe and lingula. Atelectatic changes noted in the right lower lung region. Central catheter tip is in the superior vena cava near the cavoatrial junction. Heart is upper normal in size with pulmonary vascularity normal. There is aortic atherosclerosis. No bone lesions. IMPRESSION: Note that portions of the left upper lobe obscured by the patient's mandible. Pleural effusions are noted bilaterally, larger on the left than on the right, stable. Suspect atelectasis and potential superimposed airspace consolidation left lower lobe and lingular regions, stable. Atelectatic change right base, stable. Stable cardiac silhouette. Central catheter tip in superior vena cava near cavoatrial junction. No pneumothorax evident. Aortic Atherosclerosis (ICD10-I70.0). Electronically Signed   By: Lowella Grip III M.D.   On: 04/22/2020 07:59   DG CHEST PORT 1 VIEW  Result Date: 04/21/2020 CLINICAL DATA:  Shortness of breath. EXAM: PORTABLE CHEST 1 VIEW COMPARISON:  April 20, 2020. FINDINGS: Stable cardiomediastinal silhouette. Stable bilateral interstitial densities are noted concerning for pulmonary edema. Stable bilateral pleural effusions are noted, left greater than right. No  pneumothorax is noted. Right-sided PICC line is unchanged in position. Bony thorax is unremarkable. IMPRESSION: Stable bilateral pulmonary edema and pleural effusions. Electronically Signed   By: Marijo Conception M.D.   On: 04/21/2020 09:07   DG CHEST PORT 1 VIEW  Result Date: 04/20/2020 CLINICAL DATA:  Shortness of breath EXAM: PORTABLE CHEST 1 VIEW COMPARISON:  Chest x-ray dated 04/19/2020. FINDINGS: Improved aeration of the LEFT upper lung. Persistent layering pleural effusion on the LEFT, moderate to large in size. Stable interstitial opacities within the upper and lower zones of the RIGHT lung. Probable small RIGHT pleural effusion. No pneumothorax is seen. Heart size and mediastinal contours are grossly stable. IMPRESSION: 1. Patchy interstitial/ground-glass opacities bilaterally, edema versus multifocal pneumonia. 2. Improved aeration of the LEFT upper lung. 3. Persistent layering pleural effusion on the LEFT, moderate to large in size. 4. Probable small RIGHT pleural effusion. Electronically Signed   By: Franki Cabot M.D.   On: 04/20/2020 06:27   DG CHEST PORT 1 VIEW  Result Date: 04/19/2020 CLINICAL DATA:  Dyspnea EXAM: PORTABLE CHEST 1 VIEW COMPARISON:  04/12/2020 FINDINGS: Extensive airspace opacity, confluent on the left where there is nearly no aerated lung. Pleural effusions on the left more than right. Largely obscured heart size which is likely enlarged. Right-sided PICC with tip at the upper right atrium. No pneumothorax IMPRESSION: Extensive airspace disease with asymmetry favoring infection over edema. There are pleural  effusions, greater on the left, and there may be superimposed failure. Electronically Signed   By: Monte Fantasia M.D.   On: 04/19/2020 06:59   DG Chest Port 1 View  Result Date: 04/13/2020 CLINICAL DATA:  Encephalopathy EXAM: PORTABLE CHEST 1 VIEW COMPARISON:  None. FINDINGS: The lungs are hyperinflated with diffuse interstitial prominence. No focal airspace  consolidation or pulmonary edema. No pleural effusion or pneumothorax. Normal cardiomediastinal contours. Calcified left hilar nodes are unchanged. IMPRESSION: COPD without acute airspace disease. Electronically Signed   By: Ulyses Jarred M.D.   On: 04/13/2020 00:33   DG Swallowing Func-Speech Pathology  Result Date: 04/18/2020 Objective Swallowing Evaluation: Type of Study: Bedside Swallow Evaluation  Patient Details Name: ANITA LAGUNA MRN: [id number] Date of Birth: 06-17-1946 Today's Date: 04/18/2020 Time: SLP Start Time (ACUTE ONLY): 1225 -SLP Stop Time (ACUTE ONLY): 1255 SLP Time Calculation (min) (ACUTE ONLY): 30 min Past Medical History: No past medical history on file. Past Surgical History: The histories are not reviewed yet. Please review them in the "History" navigator section and refresh this [place]. HPI: Pt presented with AMS, was found down covered in feces at home by her neighbors.  PMH for smoking, Pneumonitis due to inhalation of food and vomit in 2016, Pressure ulcer of sacral region, stage 1, Acute respiratory failure with hypoxia, protein-calorie malnutrition, bacterial pneumonia, was living alone, Diarrhea, Fluid overload, Altered mental status, Klebsiella pneumoniae (k. pneumoniae), Pure hypercholesterolemia, Essential (primary) hypertension, Hypotension, Tobacco use, bilirubin metabolism, Escherichia coli (E. coli), , Hypomagnesemia, Hypokalemia, Anemia, unspecified, Body mass index (BMI) 19.9 or less, adult malaise.  Subjective: pt awake in bed Assessment / Plan / Recommendation CHL IP CLINICAL IMPRESSIONS 04/18/2020 Clinical Impression Pt's positioning is poor as she has a difficulty time even holding up her head.   Pt presents with moderately severe oropharyngeal dysphagia c/b decreased oral coordination, propulsion, with premature spillage of barium into pharynx.  Pharyngeal swallow timing was impaired resulting in decreased laryngeal closure - allowing mild laryngeal  penetration of nectar and thin liquids mixed with secretions and aspiration of thin.  Aspiration resulted in reflexive cough response that did not clear aspirates.  Cues for pt to clear her throat did not clear penetrates nor did a "hock" facilitate expectoration.  Chin tuck posture *which is pt's natural position* did not prevent penetration but diminished it with nectar to trace.   Pt also appears with prominent cricopharyngeus which traps some thin barium -causing SLP to question if pt may have primary esophageal dysphagia with secondary pharyngeal deficits.   Upon esophageal sweep, pt appeared with retention that she did not sense.  With more barium provided (pudding thick), retention appeared to worsen.  Pt then coughed which resulted in appearance of significant backflow/retrograde propulsion to the mid-esophagus without pt awareness.  Suspect primary aspiration occurs  from esophageal dysphagia but pt's oropharyngeal dysphagia factors into risk as well.   To mitigate aspiration risk, a modified diet may be helpful.  Would recommend pt have a palliative consult given her h/o asp pneumonitis, COPD and FTT.  Pt stated during MBS "I want to go home".  Due to overt aspiration of thin, recommend dys2/nectar diet and allow tsps of thin. SLP Visit Diagnosis Dysphagia, pharyngoesophageal phase (R13.14);Dysphagia, oropharyngeal phase (R13.12) Attention and concentration deficit following -- Frontal lobe and executive function deficit following -- Impact on safety and function Moderate aspiration risk   CHL IP TREATMENT RECOMMENDATION 04/18/2020 Treatment Recommendations Therapy as outlined in treatment plan below   Prognosis 04/18/2020 Prognosis  for Safe Diet Advancement Guarded Barriers to Reach Goals Time post onset;Severity of deficits Barriers/Prognosis Comment -- CHL IP DIET RECOMMENDATION 04/18/2020 SLP Diet Recommendations Dysphagia 2 (Fine chop) solids;Nectar thick liquid Liquid Administration via Straw  Medication Administration Whole meds with puree Compensations Slow rate;Small sips/bites;Chin tuck;Use straw to facilitate chin tuck Postural Changes Remain semi-upright after after feeds/meals (Comment);Seated upright at 90 degrees   CHL IP OTHER RECOMMENDATIONS 04/18/2020 Recommended Consults -- Oral Care Recommendations Oral care QID Other Recommendations Order thickener from pharmacy;Have oral suction available;Clarify dietary restrictions   CHL IP FOLLOW UP RECOMMENDATIONS 04/18/2020 Follow up Recommendations Skilled Nursing facility   [facility] IP FREQUENCY AND DURATION 04/18/2020 Speech Therapy Frequency (ACUTE ONLY) min 2x/week Treatment Duration 2 weeks      CHL IP ORAL PHASE 04/18/2020 Oral Phase Impaired Oral - Pudding Teaspoon -- Oral - Pudding Cup -- Oral - Honey Teaspoon -- Oral - Honey Cup -- Oral - Nectar Teaspoon Premature spillage Oral - Nectar Cup Premature spillage Oral - Nectar Straw Premature spillage Oral - Thin Teaspoon Decreased bolus cohesion;Premature spillage Oral - Thin Cup Premature spillage Oral - Thin Straw Premature spillage Oral - Puree Delayed oral transit Oral - Mech Soft Impaired mastication;Lingual/palatal residue;Delayed oral transit Oral - Regular -- Oral - Multi-Consistency -- Oral - Pill -- Oral Phase - Comment --  CHL IP PHARYNGEAL PHASE 04/18/2020 Pharyngeal Phase Impaired Pharyngeal- Pudding Teaspoon -- Pharyngeal -- Pharyngeal- Pudding Cup -- Pharyngeal -- Pharyngeal- Honey Teaspoon -- Pharyngeal -- Pharyngeal- Honey Cup -- Pharyngeal -- Pharyngeal- Nectar Teaspoon Delayed swallow initiation-vallecula Pharyngeal Material does not enter airway Pharyngeal- Nectar Cup -- Pharyngeal Material does not enter airway Pharyngeal- Nectar Straw Penetration/Aspiration during swallow;Reduced laryngeal elevation;Reduced airway/laryngeal closure;Pharyngeal residue - cp segment Pharyngeal Material enters airway, remains ABOVE vocal cords and not ejected out Pharyngeal- Thin Teaspoon Reduced  laryngeal elevation;Reduced airway/laryngeal closure;Penetration/Aspiration during swallow Pharyngeal Material enters airway, remains ABOVE vocal cords and not ejected out Pharyngeal- Thin Cup Penetration/Aspiration during swallow;Pharyngeal residue - cp segment;Reduced laryngeal elevation;Reduced airway/laryngeal closure Pharyngeal Material enters airway, remains ABOVE vocal cords and not ejected out Pharyngeal- Thin Straw Penetration/Aspiration during swallow;Pharyngeal residue - cp segment;Moderate aspiration Pharyngeal Material enters airway, CONTACTS cords and not ejected out;Material enters airway, remains ABOVE vocal cords and not ejected out;Material enters airway, passes BELOW cords and not ejected out despite cough attempt by patient Pharyngeal- Puree WFL;Delayed swallow initiation-vallecula Pharyngeal Material does not enter airway Pharyngeal- Mechanical Soft Delayed swallow initiation-vallecula;Pharyngeal residue - valleculae Pharyngeal Material does not enter airway Pharyngeal- Regular -- Pharyngeal -- Pharyngeal- Multi-consistency -- Pharyngeal -- Pharyngeal- Pill -- Pharyngeal -- Pharyngeal Comment --  CHL IP CERVICAL ESOPHAGEAL PHASE 04/18/2020 Cervical Esophageal Phase Impaired Pudding Teaspoon -- Pudding Cup -- Honey Teaspoon -- Honey Cup -- Nectar Teaspoon Reduced cricopharyngeal relaxation;Prominent cricopharyngeal segment Nectar Cup Reduced cricopharyngeal relaxation;Prominent cricopharyngeal segment Nectar Straw Reduced cricopharyngeal relaxation;Prominent cricopharyngeal segment Thin Teaspoon Reduced cricopharyngeal relaxation;Prominent cricopharyngeal segment Thin Cup Reduced cricopharyngeal relaxation;Prominent cricopharyngeal segment;Esophageal backflow into the pharynx Thin Straw Prominent cricopharyngeal segment;Reduced cricopharyngeal relaxation;Esophageal backflow into the pharynx Puree Prominent cricopharyngeal segment Mechanical Soft Prominent cricopharyngeal segment Regular --  Multi-consistency -- Pill -- Cervical Esophageal Comment minimal backflow of nectar and thin into pharynx due to decreased relaxation of CP, CP did not impair barium flow of pudding nor cracker bolus Kathleen Lime, MS [facility] SLP Acute Rehab Services Office [phone] Pager [phone] Macario Golds 04/18/2020, 3:03 PM              ECHOCARDIOGRAM COMPLETE  Result Date:  04/13/2020    ECHOCARDIOGRAM REPORT   Patient Name:   ZAYLAH BLECHA Date of Exam: 04/13/2020 Medical Rec #:  [id number]             Height:       65.0 in Accession #:    [id number]            Weight:       112.0 lb Date of Birth:  07-Oct-1945             BSA:          1.546 m Patient Age:    74 years              BP:           113/62 mmHg Patient Gender: F                     HR:           82 bpm. Exam Location:  Inpatient Procedure: 2D Echo, Cardiac Doppler and Color Doppler STAT ECHO Indications:    CHF-Acute Systolic 469.62 / X52.84  History:        Patient has no prior history of Echocardiogram examinations.  Sonographer:    Bernadene Person RDCS Referring Phys: [id number] [place]  1. Left ventricular ejection fraction, by estimation, is 55 to 60%. The left ventricle has normal function. Left ventricular endocardial border not optimally defined to evaluate regional wall motion. Left ventricular diastolic parameters are consistent with Grade II diastolic dysfunction (pseudonormalization). Elevated left ventricular end-diastolic pressure.  2. Right ventricular systolic function is normal. The right ventricular size is normal. There is normal pulmonary artery systolic pressure. The estimated right ventricular systolic pressure is 02.7 mmHg.  3. The mitral valve is grossly normal. No evidence of mitral valve regurgitation. No evidence of mitral stenosis.  4. The aortic valve is abnormal. There is moderate calcification of the aortic valve. Aortic valve regurgitation is not visualized. Moderate aortic valve  sclerosis/calcification is present, without any evidence of aortic stenosis.  5. The inferior vena cava is normal in size with <50% respiratory variability, suggesting right atrial pressure of 8 mmHg. FINDINGS  Left Ventricle: Left ventricular ejection fraction, by estimation, is 55 to 60%. The left ventricle has normal function. Left ventricular endocardial border not optimally defined to evaluate regional wall motion. The left ventricular internal cavity size was normal in size. There is no left ventricular hypertrophy. Left ventricular diastolic parameters are consistent with Grade II diastolic dysfunction (pseudonormalization). Elevated left ventricular end-diastolic pressure. Right Ventricle: The right ventricular size is normal. No increase in right ventricular wall thickness. Right ventricular systolic function is normal. There is normal pulmonary artery systolic pressure. The tricuspid regurgitant velocity is 2.36 m/s, and  with an assumed right atrial pressure of 8 mmHg, the estimated right ventricular systolic pressure is 25.3 mmHg. Left Atrium: Left atrial size was normal in size. Right Atrium: Right atrial size was normal in size. Pericardium: There is no evidence of pericardial effusion. Mitral Valve: Mild chordal SAM. The mitral valve is grossly normal. Mild mitral annular calcification. No evidence of mitral valve regurgitation. No evidence of mitral valve stenosis. Tricuspid Valve: The tricuspid valve is normal in structure. Tricuspid valve regurgitation is mild . No evidence of tricuspid stenosis. Aortic Valve: The aortic valve is abnormal. There is moderate calcification of the aortic valve. Aortic valve regurgitation is not visualized. Mild to moderate aortic valve sclerosis/calcification is present, without any evidence  of aortic stenosis. Pulmonic Valve: The pulmonic valve was grossly normal. Pulmonic valve regurgitation is trivial. No evidence of pulmonic stenosis. Aorta: The aortic root is  normal in size and structure. Venous: The inferior vena cava is normal in size with less than 50% respiratory variability, suggesting right atrial pressure of 8 mmHg. IAS/Shunts: No atrial level shunt detected by color flow Doppler. Additional Comments: A venous catheter is visualized in the right atrium.  LEFT VENTRICLE PLAX 2D LVIDd:         4.20 cm  Diastology LVIDs:         2.50 cm  LV e' medial:    3.68 cm/s LV PW:         1.00 cm  LV E/e' medial:  19.0 LV IVS:        0.80 cm  LV e' lateral:   6.13 cm/s LVOT diam:     2.00 cm  LV E/e' lateral: 11.4 LV SV:         54 LV SV Index:   35 LVOT Area:     3.14 cm  RIGHT VENTRICLE RV S prime:     11.20 cm/s TAPSE (M-mode): 1.5 cm LEFT ATRIUM             Index       RIGHT ATRIUM           Index LA diam:        2.90 cm 1.88 cm/m  RA Area:     15.00 cm LA Vol (A2C):   50.7 ml 32.79 ml/m RA Volume:   34.20 ml  22.12 ml/m LA Vol (A4C):   25.2 ml 16.30 ml/m LA Biplane Vol: 39.6 ml 25.61 ml/m  AORTIC VALVE LVOT Vmax:   90.50 cm/s LVOT Vmean:  66.250 cm/s LVOT VTI:    0.172 m  AORTA Ao Root diam: 3.00 cm Ao Asc diam:  2.70 cm MITRAL VALVE               TRICUSPID VALVE MV Area (PHT): 3.12 cm    TR Peak grad:   22.3 mmHg MV Decel Time: 243 msec    TR Vmax:        236.00 cm/s MV E velocity: 69.80 cm/s MV A velocity: 69.00 cm/s  SHUNTS MV E/A ratio:  1.01        Systemic VTI:  0.17 m                            Systemic Diam: 2.00 cm Cherlynn Kaiser MD Electronically signed by Cherlynn Kaiser MD Signature Date/Time: 04/13/2020/9:20:40 AM    Final    US LIVER DOPPLER  Result Date: 04/14/2020 CLINICAL DATA:  Evaluate hepatic vasculature. EXAM: DUPLEX ULTRASOUND OF LIVER TECHNIQUE: Color and duplex Doppler ultrasound was performed to evaluate the hepatic in-flow and out-flow vessels. COMPARISON:  CT abdomen and pelvis-04/13/2020 FINDINGS: Liver: Normal hepatic contour and echogenicity. Note is made of os mild atrophy of the left lobe of the liver as demonstrated on  preceding noncontrast abdominal CT. No discrete hepatic lesions. No intra or extrahepatic biliary duct dilatation. Gallbladder: Layering echogenic stones/sludge are seen within otherwise normal-appearing gallbladder (images [id number]), as demonstrated on preceding abdominal CT. Main Portal Vein size: 1.3 cm Portal Vein Velocities (normal directional hepatopetal flow demonstrated throughout the portal venous system). Main Prox:  23 cm/sec Main Mid: 13 cm/sec Main Dist:  16 cm/sec Right: 20 cm/sec Left: 15 cm/sec Hepatic Vein Velocities (  normal directional hepatofugal flow demonstrated throughout the hepatic venous system). Right:  13 cm/sec Middle:  24 cm/sec Left:  15 cm/sec IVC: Present and patent with normal respiratory phasicity. Hepatic Artery Velocity:  57 cm/sec Splenic Vein Velocity:  8.0 cm/sec Spleen: 4.9 cm x 8.5 cm x 55.4 cm with a total volume of 117 cm^3 (411 cm^3 is upper limit normal) Portal Vein Occlusion/Thrombus: No Splenic Vein Occlusion/Thrombus: No Ascites: Trace amount of perihepatic ascites as demonstrated on preceding noncontrast abdominal CT. Varices: None visualized Other: Note is made of small/trace bilateral pleural effusions as was demonstrated on preceding abdominal CT. IMPRESSION: 1. Widely patent hepatic vasculature with normal directional flow. 2. Nonspecific atrophy of the left lobe of the liver as demonstrated on preceding abdominal CT. No discrete hepatic lesions. 3. Trace amount of perihepatic ascites and small/trace bilateral effusions as was demonstrated on preceding abdominal CT. 4. Cholelithiasis without evidence of cholecystitis. Electronically Signed   By: Sandi Mariscal M.D.   On: 04/14/2020 14:25   VAS [place] LOWER EXTREMITY VENOUS (DVT)  Result Date: 04/14/2020  Lower Venous DVTStudy Indications: Edema, and Erythema.  Limitations: Poor ultrasound/tissue interface, bandages and boots. Comparison Study: No prior studies. Performing Technologist: Darlin Coco  Examination  Guidelines: A complete evaluation includes B-mode imaging, spectral Doppler, color Doppler, and power Doppler as needed of all accessible portions of each vessel. Bilateral testing is considered an integral part of a complete examination. Limited examinations for reoccurring indications may be performed as noted. The reflux portion of the exam is performed with the patient in reverse Trendelenburg.  +---------+---------------+---------+-----------+----------+-------------------+ RIGHT    CompressibilityPhasicitySpontaneityPropertiesThrombus Aging      +---------+---------------+---------+-----------+----------+-------------------+ CFV      Full           Yes      Yes                                      +---------+---------------+---------+-----------+----------+-------------------+ SFJ      Full                                                             +---------+---------------+---------+-----------+----------+-------------------+ FV Prox  Full                                                             +---------+---------------+---------+-----------+----------+-------------------+ FV Mid   Full                                                             +---------+---------------+---------+-----------+----------+-------------------+ FV DistalFull                                                             +---------+---------------+---------+-----------+----------+-------------------+  PFV      Full                                                             +---------+---------------+---------+-----------+----------+-------------------+ POP      Full           Yes      Yes                                      +---------+---------------+---------+-----------+----------+-------------------+ PTV                                                   Not well visualized +---------+---------------+---------+-----------+----------+-------------------+ PERO                                                   Not well                                                                  visualized, chronic                                                       at visualized                                                             segment             +---------+---------------+---------+-----------+----------+-------------------+   +---------+---------------+---------+-----------+----------+-------------------+ LEFT     CompressibilityPhasicitySpontaneityPropertiesThrombus Aging      +---------+---------------+---------+-----------+----------+-------------------+ CFV      Full           Yes      Yes                                      +---------+---------------+---------+-----------+----------+-------------------+ SFJ      Full                                                             +---------+---------------+---------+-----------+----------+-------------------+ FV Prox  Full                                                             +---------+---------------+---------+-----------+----------+-------------------+  FV Mid   Full                                                             +---------+---------------+---------+-----------+----------+-------------------+ FV DistalFull                                                             +---------+---------------+---------+-----------+----------+-------------------+ PFV      Full                                                             +---------+---------------+---------+-----------+----------+-------------------+ POP      Full           Yes      Yes                                      +---------+---------------+---------+-----------+----------+-------------------+ PTV                                                   Patent by color at                                                        calf. Unable to                                                            interrogate                                                               distally due to                                                           boot/bandages.      +---------+---------------+---------+-----------+----------+-------------------+ PERO  Patent by color at                                                        calf. Unable to                                                           interrogate                                                               distally due to                                                           boot/bandages.      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 - A cystic structure is found in the popliteal fossa.  LEFT: - There is no evidence of deep vein thrombosis in the lower extremity. However, portions of this examination were limited- see technologist comments above.  *See table(s) above for measurements and observations. Electronically signed by Ruta Hinds MD on 04/14/2020 at 5:42:10 PM.    Final    [place] EKG SITE RITE  Result Date: 04/16/2020 If Site Rite image not attached, placement could not be confirmed due to current cardiac rhythm.    Subjective: No acute issues or events overnight denies nausea, vomiting, diarrhea, constipation, headache, fevers, chills   Discharge Exam: Vitals:   05/02/20 0300 05/02/20 0746  BP: (!) 141/77 139/75  Pulse: 85 70  Resp: 16 16  Temp: 97.6 F (36.4 C) 98.2 F (36.8 C)  SpO2: 94% 95%   Vitals:   05/01/20 1436 05/01/20 1954 05/02/20 0300 05/02/20 0746  BP: 116/65 (!) 142/72 (!) 141/77 139/75  Pulse: 79 89 85 70  Resp: 17 16 16 16   Temp: 98.2 F (36.8 C) 97.7 F (36.5 C) 97.6 F  (36.4 C) 98.2 F (36.8 C)  TempSrc: Oral Oral Axillary Oral  SpO2: 98% 97% 94% 95%  Weight:      Height:       General exam: Appears calm and comfortable, communicating well Respiratory system: Clear to auscultation. Respiratory effort normal. Cardiovascular system: S1 & S2 heard, RRR. No JVD, murmurs, rubs, gallops or clicks. No pedal edema. Gastrointestinal system: Abdomen is nondistended, soft and nontender. No organomegaly or masses felt. Normal bowel sounds heard. Central nervous system: Alert and oriented. No focal neurological deficits. 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  Left leg: Dressing dry intact. Psychiatry: Judgement and insight appear normal. Mood & affect appropriate.  The results of significant diagnostics from this hospitalization (including imaging, microbiology, ancillary and laboratory) are listed below  for reference.     Microbiology: No results found for this or any previous visit (from the past 240 hour(s)).   Labs: BNP (last 3 results) Recent Labs    04/19/20 1330 04/21/20 1027  BNP 1,113.0* 024.0*   Basic Metabolic Panel: Recent Labs  Lab 04/26/20 1100 04/26/20 1541 04/27/20 0423 04/27/20 0825 04/28/20 0346 04/28/20 1224 04/29/20 1200 04/29/20 1600 04/30/20 0024 04/30/20 1442 05/01/20 0152  NA 142  --  139  --  139  --   --   --   --  135 133*  K 4.8  --  4.5  --  4.1  --   --   --   --  4.2 4.0  CL 100  --  99  --  99  --   --   --   --  97* 95*  CO2 32  --  33*  --  33*  --   --   --   --  32 30  GLUCOSE 98   < > 84   < > 77   < > 84 265* 109* 108* 106*  BUN 14  --  11  --  14  --   --   --   --  13 11  CREATININE 0.94  --  0.96  --  1.06*  --   --   --   --  0.99 0.89  CALCIUM 7.8*  --  7.8*  --  7.6*  --   --   --   --  7.7* 7.6*  MG 1.4*  --  2.4  --  2.4  --   --   --   --   --   --   PHOS  --   --  3.8  --  4.5  --   --   --   --   --   --    < > = values in this interval not displayed.    Liver Function Tests: Recent Labs  Lab 04/26/20 1100 04/27/20 0423 04/28/20 0346 04/30/20 1442 05/01/20 0152  AST 34 27 23 21 18   ALT 81* 32 26 18 17   ALKPHOS 298* 269* 217* 186* 176*  BILITOT 0.8 0.9 1.0 0.6 0.6  PROT 5.4* 5.2* 4.7* 5.1* 5.0*  ALBUMIN 1.8* 1.7* 1.5* 1.5* 1.5*   No results for input(s): LIPASE, AMYLASE in the last 168 hours. No results for input(s): AMMONIA in the last 168 hours. CBC: Recent Labs  Lab 04/27/20 0423 04/27/20 0423 04/28/20 0346 04/28/20 0346 04/29/20 0400 04/29/20 1600 04/30/20 1442 05/01/20 0152 05/02/20 0430  WBC 10.6*   < > 9.2   < > 10.2 11.0* 15.2* 13.7* 11.6*  NEUTROABS 9.2*  --  7.8*  --  8.8*  --  14.7*  --  9.8*  HGB 8.0*   < > 7.1*   < > 6.2* 7.9* 9.1* 8.9* 8.9*  HCT 26.8*   < > 23.7*   < > 21.4* 25.1* 29.2* 28.1* 28.9*  MCV 89.9   < > 92.9   < > 96.4 90.3 92.4 92.7 94.4  PLT 63*   < > 82*   < > 119* 136* 185 231 284   < > = values in this interval not displayed.   Cardiac Enzymes: No results for input(s): CKTOTAL, CKMB, CKMBINDEX, TROPONINI in the last 168 hours. BNP: Invalid input(s): POCBNP CBG: Recent Labs  Lab 05/01/20 0436 05/01/20 0639 05/01/20 1130 05/01/20 1651 05/01/20 1955  GLUCAP 97 91 101* 96 95   D-Dimer No results for input(s): DDIMER in the last 72 hours. Hgb A1c No results for input(s): HGBA1C in the last 72 hours. Lipid Profile No results for input(s): CHOL, HDL, LDLCALC, TRIG, CHOLHDL, LDLDIRECT in the last 72 hours. Thyroid function studies No results for input(s): TSH, T4TOTAL, T3FREE, THYROIDAB in the last 72 hours.  Invalid input(s): FREET3 Anemia work up No results for input(s): VITAMINB12, FOLATE, FERRITIN, TIBC, IRON, RETICCTPCT in the last 72 hours. Urinalysis    Component Value Date/Time   COLORURINE AMBER (A) 04/13/2020 0609   APPEARANCEUR CLOUDY (A) 04/13/2020 0609   LABSPEC 1.019 04/13/2020 0609   PHURINE 5.0 04/13/2020 0609   GLUCOSEU NEGATIVE 04/13/2020 0609   HGBUR  NEGATIVE 04/13/2020 0609   [place] NEGATIVE 04/13/2020 0609   [place] 04/13/2020 0609   PROTEINUR 30 (A) 04/13/2020 0609   NITRITE NEGATIVE 04/13/2020 0609   LEUKOCYTESUR TRACE (A) 04/13/2020 0609   Sepsis Labs Invalid input(s): PROCALCITONIN,  WBC,  LACTICIDVEN Microbiology No results found for this or any previous visit (from the past 240 hour(s)).   Time coordinating discharge: Over 30 minutes  SIGNED:   Little Ishikawa, DO [facility] 05/02/2020, 1:02 PM Pager   If 7PM-7AM, please contact night-coverage [email or web address]

## 2020-05-02 NOTE — Progress Notes (Signed)
Report called to facility, all questions answered. Pt belongings gathered, paperwork in chart for PTAR. PTAR called by cm, will continue to monitor until d/c.

## 2020-05-02 NOTE — Plan of Care (Signed)

## 2020-05-02 NOTE — Progress Notes (Signed)
Seen this morning on rounds. Looks good overall. Right AKA stump healing appropriately. Ulceration on lateral thigh unchanged.  Will need local care going forward. Lengthy discussion with daughter who is at bedside today. Explained that she should follow up with me in 2 to 4 weeks to evaluate the stump and possibly remove the sutures. She does not need to follow-up with me if she needs to move to [place] for better care with her family. We will find vascular surgical care narrow her new home if this is the case. I encouraged the daughter to call my office for questions. We will sign off.  Please call for questions.  Yevonne Aline. Stanford Breed, MD [facility] Phone Number: [phone] 05/02/2020 10:26 AM

## 2020-05-02 NOTE — TOC Progression Note (Signed)
Transition of Care [facility]) - Progression Note    Patient Details  Name: Michelle Avila MRN: [id number] Date of Birth: 07-25-1945  [facility] CM/SW [place], RN Phone Number: 05/02/2020, 11:55 AM  Clinical Narrative:    Case management spoke with [facility] this morning regarding need for additional clinicals to secure insurance authorization for the patient.  The insurance authorization is currently under review and is pending possible approval.  Will continue to follow for SNF placement at [facility].   Expected Discharge Plan: [place] Barriers to Discharge: Continued Medical Work up  Expected Discharge Plan and Services Expected Discharge Plan: [place]   Discharge Planning Services: CM Consult Post Acute Care Choice: [place] Living arrangements for the past 2 months: Single Family Home                                       Social Determinants of Health (SDOH) Interventions    Readmission Risk Interventions Readmission Risk Prevention Plan 04/30/2020  Transportation Screening Complete  PCP or Specialist Appt within 5-7 Days Complete  Home Care Screening Complete  Medication Review (RN CM) Complete  Some recent data might be hidden

## 2020-05-03 DIAGNOSIS — M6281 Muscle weakness (generalized): Secondary | ICD-10-CM | POA: Diagnosis not present

## 2020-05-03 DIAGNOSIS — R2681 Unsteadiness on feet: Secondary | ICD-10-CM | POA: Diagnosis not present

## 2020-05-03 DIAGNOSIS — L989 Disorder of the skin and subcutaneous tissue, unspecified: Secondary | ICD-10-CM | POA: Diagnosis not present

## 2020-05-03 DIAGNOSIS — Z89611 Acquired absence of right leg above knee: Secondary | ICD-10-CM | POA: Diagnosis not present

## 2020-05-03 DIAGNOSIS — Z79899 Other long term (current) drug therapy: Secondary | ICD-10-CM | POA: Diagnosis not present

## 2020-05-03 DIAGNOSIS — Z7401 Bed confinement status: Secondary | ICD-10-CM | POA: Diagnosis not present

## 2020-05-03 DIAGNOSIS — M2550 Pain in unspecified joint: Secondary | ICD-10-CM | POA: Diagnosis not present

## 2020-05-03 DIAGNOSIS — R1314 Dysphagia, pharyngoesophageal phase: Secondary | ICD-10-CM | POA: Diagnosis not present

## 2020-05-03 DIAGNOSIS — E162 Hypoglycemia, unspecified: Secondary | ICD-10-CM | POA: Diagnosis not present

## 2020-05-03 DIAGNOSIS — R0902 Hypoxemia: Secondary | ICD-10-CM | POA: Diagnosis not present

## 2020-05-03 DIAGNOSIS — R5381 Other malaise: Secondary | ICD-10-CM | POA: Diagnosis not present

## 2020-05-03 DIAGNOSIS — Z4781 Encounter for orthopedic aftercare following surgical amputation: Secondary | ICD-10-CM | POA: Diagnosis not present

## 2020-05-03 DIAGNOSIS — E43 Unspecified severe protein-calorie malnutrition: Secondary | ICD-10-CM | POA: Diagnosis not present

## 2020-05-03 DIAGNOSIS — Z743 Need for continuous supervision: Secondary | ICD-10-CM | POA: Diagnosis not present

## 2020-05-03 DIAGNOSIS — A419 Sepsis, unspecified organism: Secondary | ICD-10-CM | POA: Diagnosis not present

## 2020-05-03 DIAGNOSIS — M79604 Pain in right leg: Secondary | ICD-10-CM | POA: Diagnosis not present

## 2020-05-04 NOTE — Progress Notes (Signed)
Daily Progress Note   Patient Name: Michelle Avila       Date: 05/04/2020 DOB: 11-Nov-1945  Age: 74 y.o. MRN#: [id number] Attending Physician: No att. providers found Primary Care Physician: Street, Sharon [place], MD Admit Date: 04/12/2020  Reason for Consultation/Follow-up: Establishing goals of care  Subjective: I saw and examined patient today.   We discussed options for care and talked through the MOST form together.  Previously, patient has been DNR/DNI.  She now tells me that she would want CPR in the event of cardiac arrest but would not want to be intubated.  I discussed with her daughter my concern that desiring full code but not wanting to be intubated can be problematic as this will be part of ACLS protocol.  They expressed understanding concern, but would like to continue with current selections that they have made on the most form.  Michelle Avila has elected:  Full code, limited additional interventions, IV antibiotics and fluids if indicated, and feeding tube for defined trial.    Physical Exam    Awake, alert, and interactive.   Frail and chronically ill appearing Regular work of breathing Has edema Abdomen not distended.  AKA noted       Vital Signs: BP (!) 151/72 (BP Location: Left Arm)   Pulse 97   Temp (!) 97.5 F (36.4 C) (Oral)   Resp 15   Ht 5\' 5"  (1.651 m)   Wt 50.9 kg   SpO2 91%   BMI 18.67 kg/m  SpO2: SpO2: 91 % O2 Device: O2 Device: Room Air O2 Flow Rate: O2 Flow Rate (L/min): 3 L/min  Intake/output summary:  No intake or output data in the 24 hours ending 05/04/20 0934 LBM: Last BM Date: 04/25/20 Baseline Weight: Weight: 45.4 kg Most recent weight: Weight: 50.9 kg       Palliative Assessment/Data:    Flowsheet Rows     Most Recent  Value  Intake Tab  Referral Department Hospitalist  Unit at Time of Referral Other (Comment)  [urology/telementry]  Palliative Care Primary Diagnosis Sepsis/Infectious Disease  Date Notified 04/18/20  Palliative Care Type New Palliative care  Reason for referral Clarify Goals of Care  Date of Admission 04/12/20  Date first seen by Palliative Care 04/19/20  # of days Palliative referral response time 1 Day(s)  #  of days IP prior to Palliative referral 6  Clinical Assessment  Psychosocial & Spiritual Assessment  Palliative Care Outcomes      Patient Active Problem List   Diagnosis Date Noted  . Protein-calorie malnutrition, severe 04/28/2020  . Pulmonary infiltrates on CXR 04/19/2020  . Altered mental status   . Thrombocytopenia ([place])   . Dehydration 04/13/2020  . Shock ([place]) 04/13/2020  . Septic shock ([place]) 04/13/2020  . Pressure injury of skin 04/13/2020    Palliative Care Assessment & Plan  Recommendations/Plan: -We completed MOST form today. -Michelle Avila has elected:  Full code, limited additional interventions, IV antibiotics and fluids if indicated, and feeding tube for defined trial period.   -Due to patient electing to change her CODE STATUS, I notified primary attending, remove durable DNR from her transfer packet, and called and notified skilled facility where she is transferring of change in her CODE STATUS.  Discharge Planning: [place] for rehab with Palliative care service follow-up  Care plan was discussed with patient's daughter   Thank you for allowing the Palliative Medicine Team to assist in the care of this patient.   Time In: 1800 Time Out: 1850 Total Time 50 Prolonged Time Billed No   Greater than 50%  of this time was spent counseling and coordinating care related to the above assessment and plan.  [name], MD  Please contact Palliative Medicine Team phone at [phone] for questions and concerns.

## 2020-05-08 DIAGNOSIS — M79604 Pain in right leg: Secondary | ICD-10-CM | POA: Diagnosis not present

## 2020-05-08 DIAGNOSIS — Z89611 Acquired absence of right leg above knee: Secondary | ICD-10-CM | POA: Diagnosis not present

## 2020-05-08 DIAGNOSIS — E43 Unspecified severe protein-calorie malnutrition: Secondary | ICD-10-CM | POA: Diagnosis not present

## 2020-05-27 ENCOUNTER — Ambulatory Visit (INDEPENDENT_AMBULATORY_CARE_PROVIDER_SITE_OTHER): Payer: Self-pay | Admitting: Vascular Surgery

## 2020-05-27 ENCOUNTER — Other Ambulatory Visit: Payer: Self-pay

## 2020-05-27 ENCOUNTER — Encounter: Payer: Self-pay | Admitting: Vascular Surgery

## 2020-05-27 ENCOUNTER — Encounter: Payer: Medicare Other | Admitting: Vascular Surgery

## 2020-05-27 VITALS — BP 152/76 | HR 95 | Temp 98.2°F | Resp 20 | Ht 65.0 in | Wt 112.0 lb

## 2020-05-27 DIAGNOSIS — Z89611 Acquired absence of right leg above knee: Secondary | ICD-10-CM

## 2020-05-27 NOTE — Progress Notes (Signed)
   ASSESSMENT & PLAN:  Michelle Avila is a 74 y.o. female status post R AKA for non-salvageable limb.   Healing well. Removed staples and sutures today. Refer to hangar for orthotic once she has recovered fully  Follow up PRN  SUBJECTIVE:  Presents from SNF. No complaints. Healing well.  OBJECTIVE:  BP (!) 152/76 (BP Location: Left Arm, Patient Position: Sitting, Cuff Size: Normal)   Pulse 95   Temp 98.2 F (36.8 C)   Resp 20   Ht 5\' 5"  (1.651 m)   Wt 112 lb (50.8 kg)   SpO2 97%   BMI 18.64 kg/m   R AKA healing well.  CBC Latest Ref Rng & Units 05/02/2020 05/01/2020 04/30/2020  WBC 4.0 - 10.5 K/uL 11.6(H) 13.7(H) 15.2(H)  Hemoglobin 12.0 - 15.0 g/dL 8.9(L) 8.9(L) 9.1(L)  Hematocrit 36 - 46 % 28.9(L) 28.1(L) 29.2(L)  Platelets 150 - 400 K/uL 284 231 185     CMP Latest Ref Rng & Units 05/01/2020 04/30/2020 04/30/2020  Glucose 70 - 99 mg/dL 106(H) 108(H) 109(H)  BUN 8 - 23 mg/dL 11 13 -  Creatinine 0.44 - 1.00 mg/dL 0.89 0.99 -  Sodium 135 - 145 mmol/L 133(L) 135 -  Potassium 3.5 - 5.1 mmol/L 4.0 4.2 -  Chloride 98 - 111 mmol/L 95(L) 97(L) -  CO2 22 - 32 mmol/L 30 32 -  Calcium 8.9 - 10.3 mg/dL 7.6(L) 7.7(L) -  Total Protein 6.5 - 8.1 g/dL 5.0(L) 5.1(L) -  Total Bilirubin 0.3 - 1.2 mg/dL 0.6 0.6 -  Alkaline Phos 38 - 126 U/L 176(H) 186(H) -  AST 15 - 41 U/L 18 21 -  ALT 0 - 44 U/L 17 18 -    CrCl cannot be calculated (Patient's most recent lab result is older than the maximum 21 days allowed.).  Yevonne Aline. Stanford Breed, MD [facility] Phone Number: [phone] 05/27/2020 9:40 AM

## 2020-06-09 DIAGNOSIS — L989 Disorder of the skin and subcutaneous tissue, unspecified: Secondary | ICD-10-CM | POA: Diagnosis not present

## 2020-06-11 DIAGNOSIS — Z79899 Other long term (current) drug therapy: Secondary | ICD-10-CM | POA: Diagnosis not present

## 2020-06-17 DIAGNOSIS — M6281 Muscle weakness (generalized): Secondary | ICD-10-CM | POA: Diagnosis not present

## 2020-07-18 DIAGNOSIS — M6281 Muscle weakness (generalized): Secondary | ICD-10-CM | POA: Diagnosis not present

## 2021-03-26 IMAGING — CT CT HEAD W/O CM
3 series · 15 of 45 positions shown, 18 images · non-contrast
Comparison: 04/13/2020

CLINICAL DATA: Altered mental status

EXAM:
CT HEAD WITHOUT CONTRAST
TECHNIQUE: Contiguous axial images were obtained from the base of the skull
through the vertex without intravenous contrast.

[Series 2: head wo · axial · 0.47mm/px · z∈[+1683,+1798]mm · 9 of 28 slices shown, 12 images]
[im 3/28  brain]
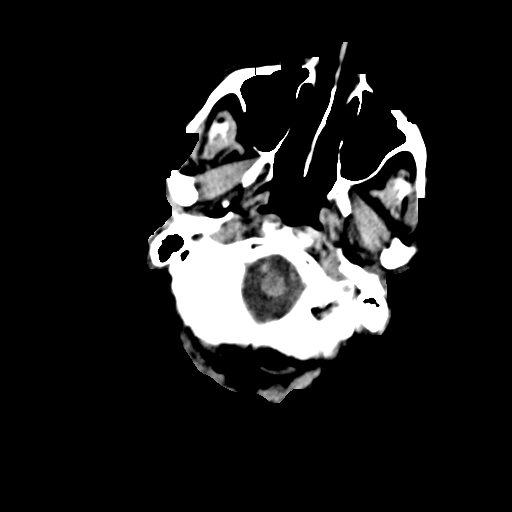
[im 3/28  bone]
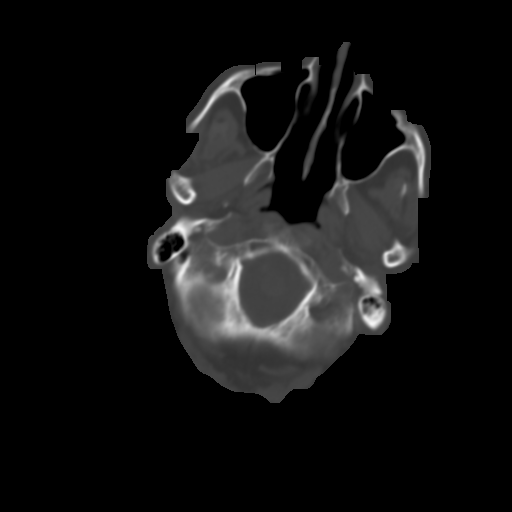
[im 6/28  brain]
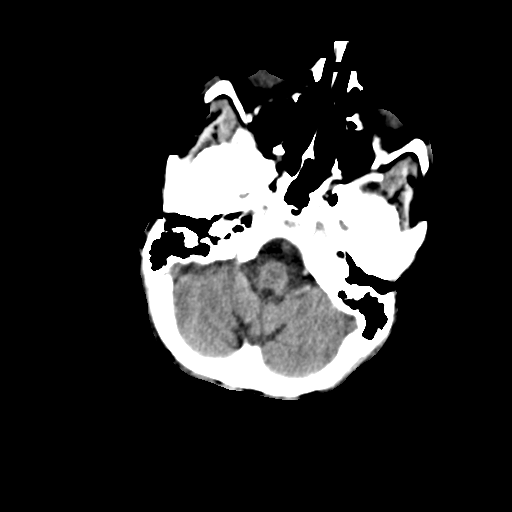
[im 9/28  brain]
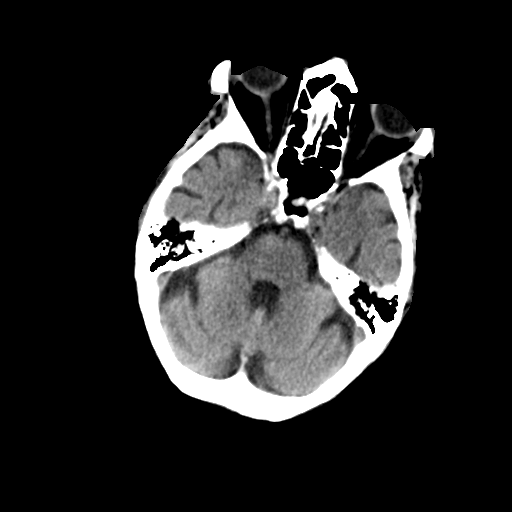
[im 12/28  brain]
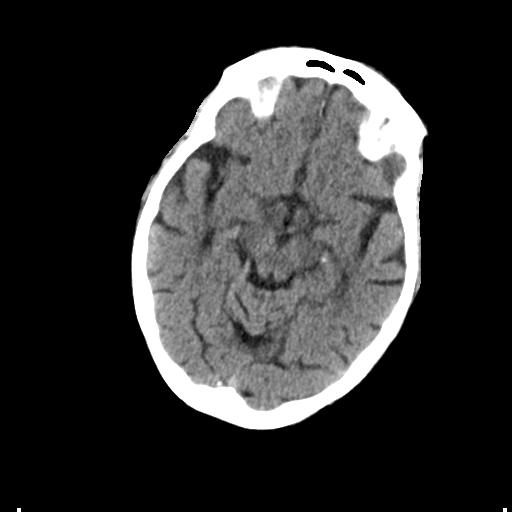
[im 15/28  brain]
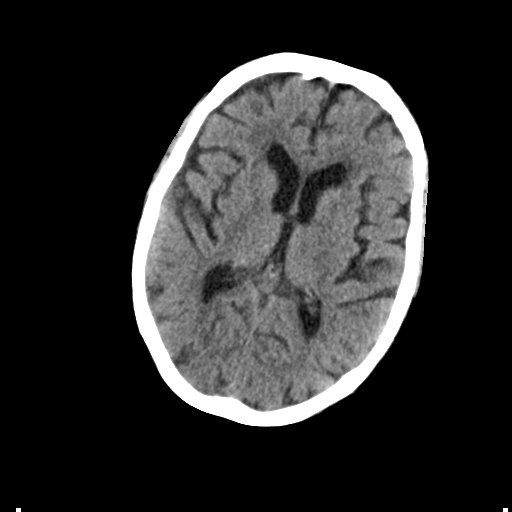
[im 15/28  bone]
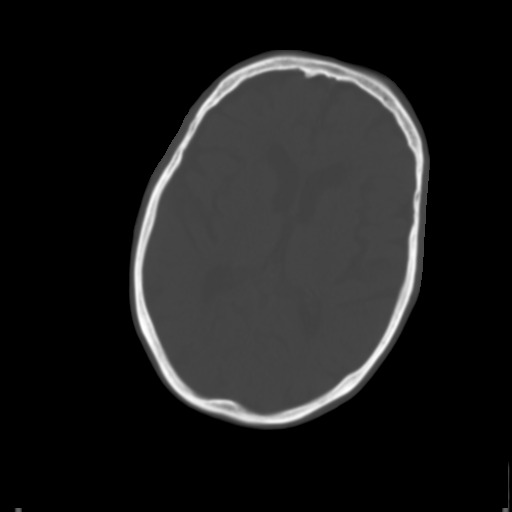
[im 17/28  brain]
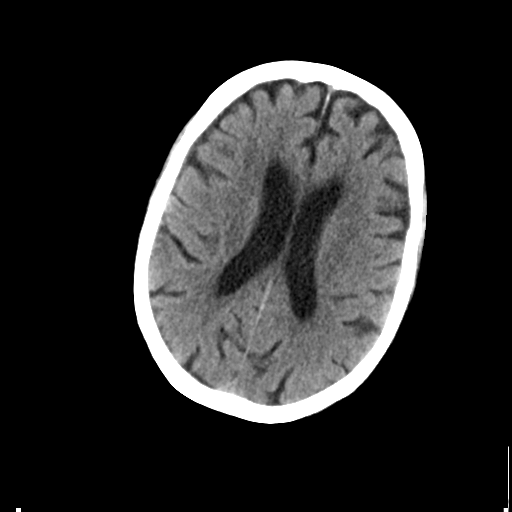
[im 20/28  brain]
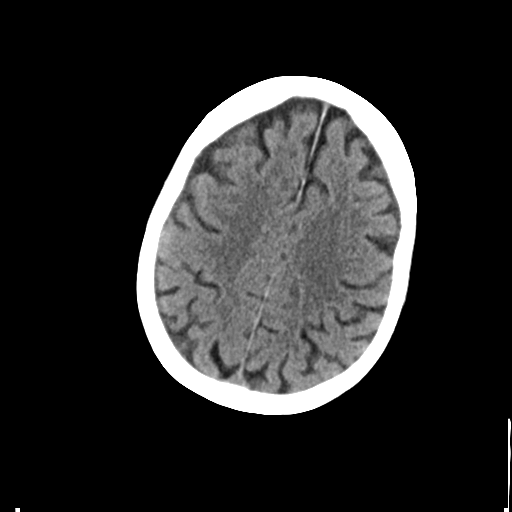
[im 23/28  brain]
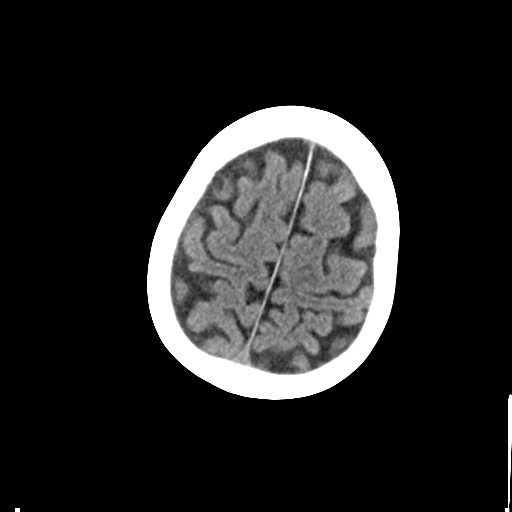
[im 26/28  brain]
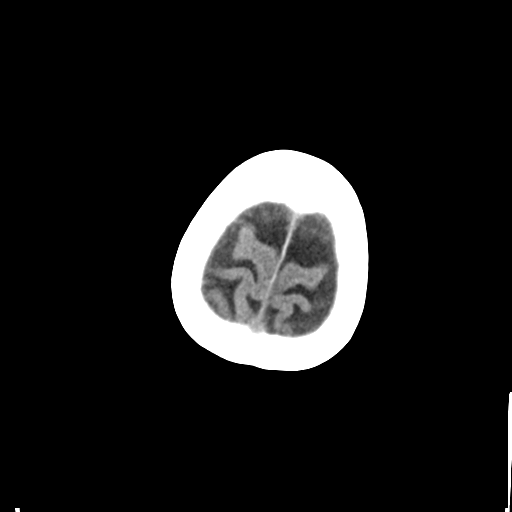
[im 26/28  bone]
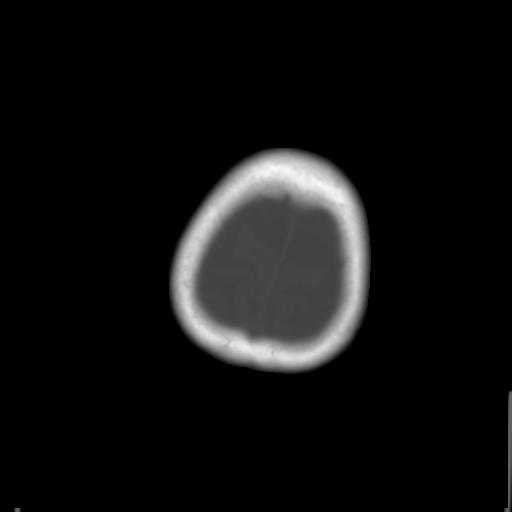

[Series 4: coronal soft tissue · coronal · 0.34mm/px · 3 of 65 slices shown]
[im 22/65  brain]
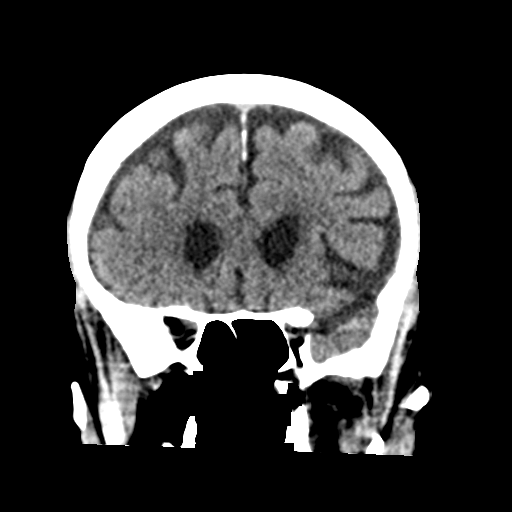
[im 29/65  brain]
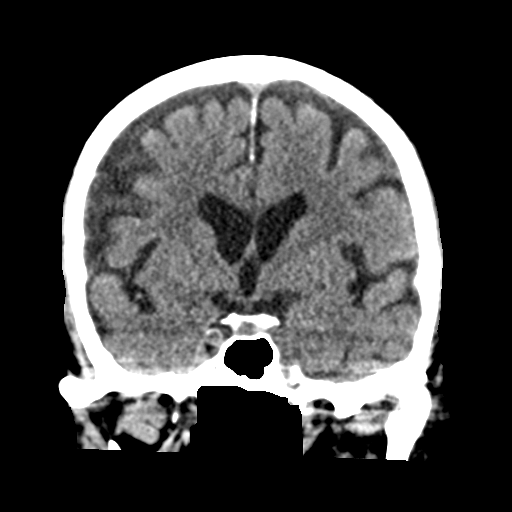
[im 36/65  brain]
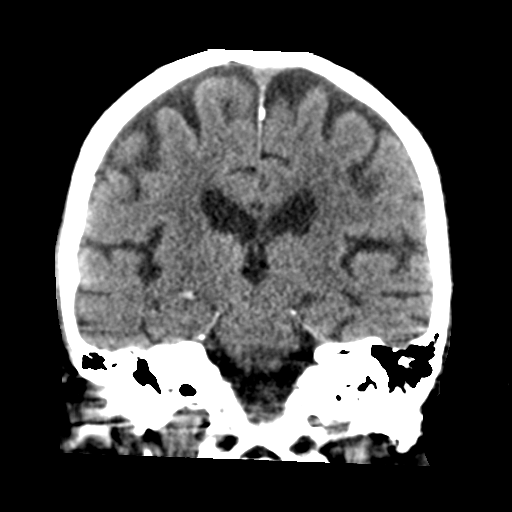

[Series 5: sagittal soft tissue · sagittal · 0.28mm/px · 3 of 50 slices shown]
[im 17/50  brain]
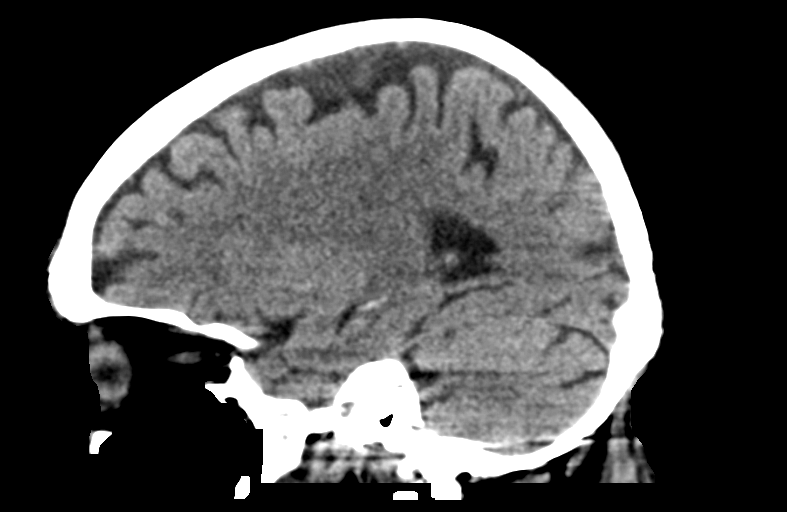
[im 25/50  brain]
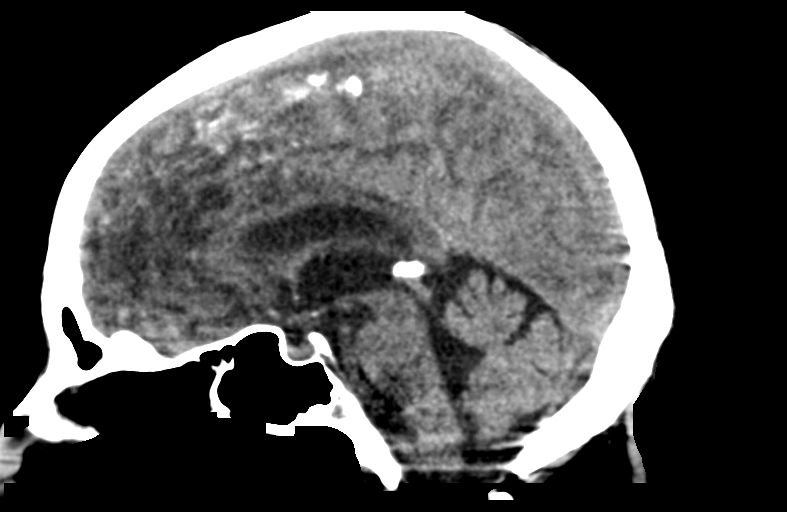
[im 33/50  brain]
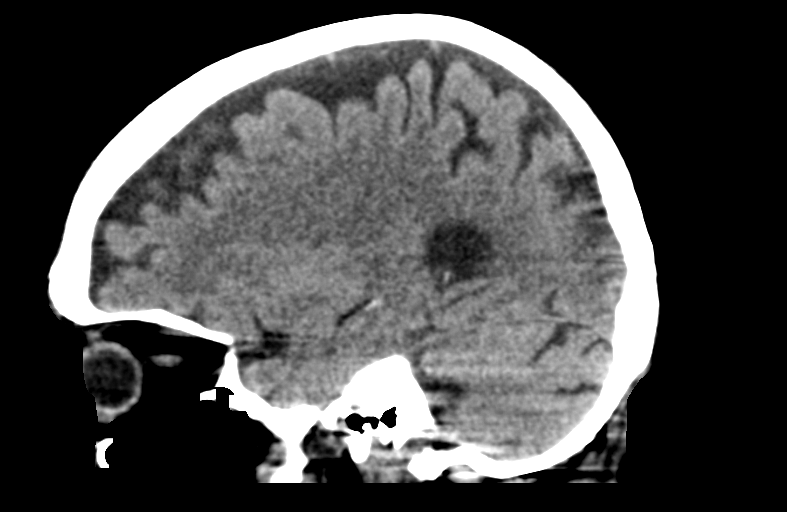

[15 of 45 positions shown; findings below may reference images not displayed]

FINDINGS: Brain: No evidence of acute infarction, hemorrhage, hydrocephalus,
extra-axial collection or mass lesion/mass effect. Scattered
low-density changes within the periventricular and subcortical white
matter compatible with chronic microvascular ischemic change. Mild
diffuse cerebral volume loss.

Vascular: Atherosclerotic calcifications involving the large vessels
of the skull base. No unexpected hyperdense vessel.

Skull: Normal. Negative for fracture or focal lesion.

Sinuses/Orbits: No acute finding.

Other: None.
IMPRESSION: 1. No acute intracranial findings.  Stable exam from 04/13/2020
[DATE]. Mild chronic microvascular ischemic change and cerebral volume
loss.

## 2021-04-01 IMAGING — DX DG CHEST 1V PORT
1 series · 1 of 1 positions shown · non-contrast
Comparison: April 21, 2020

CLINICAL DATA: Shortness of breath

EXAM:
PORTABLE CHEST 1 VIEW

[chest ap]
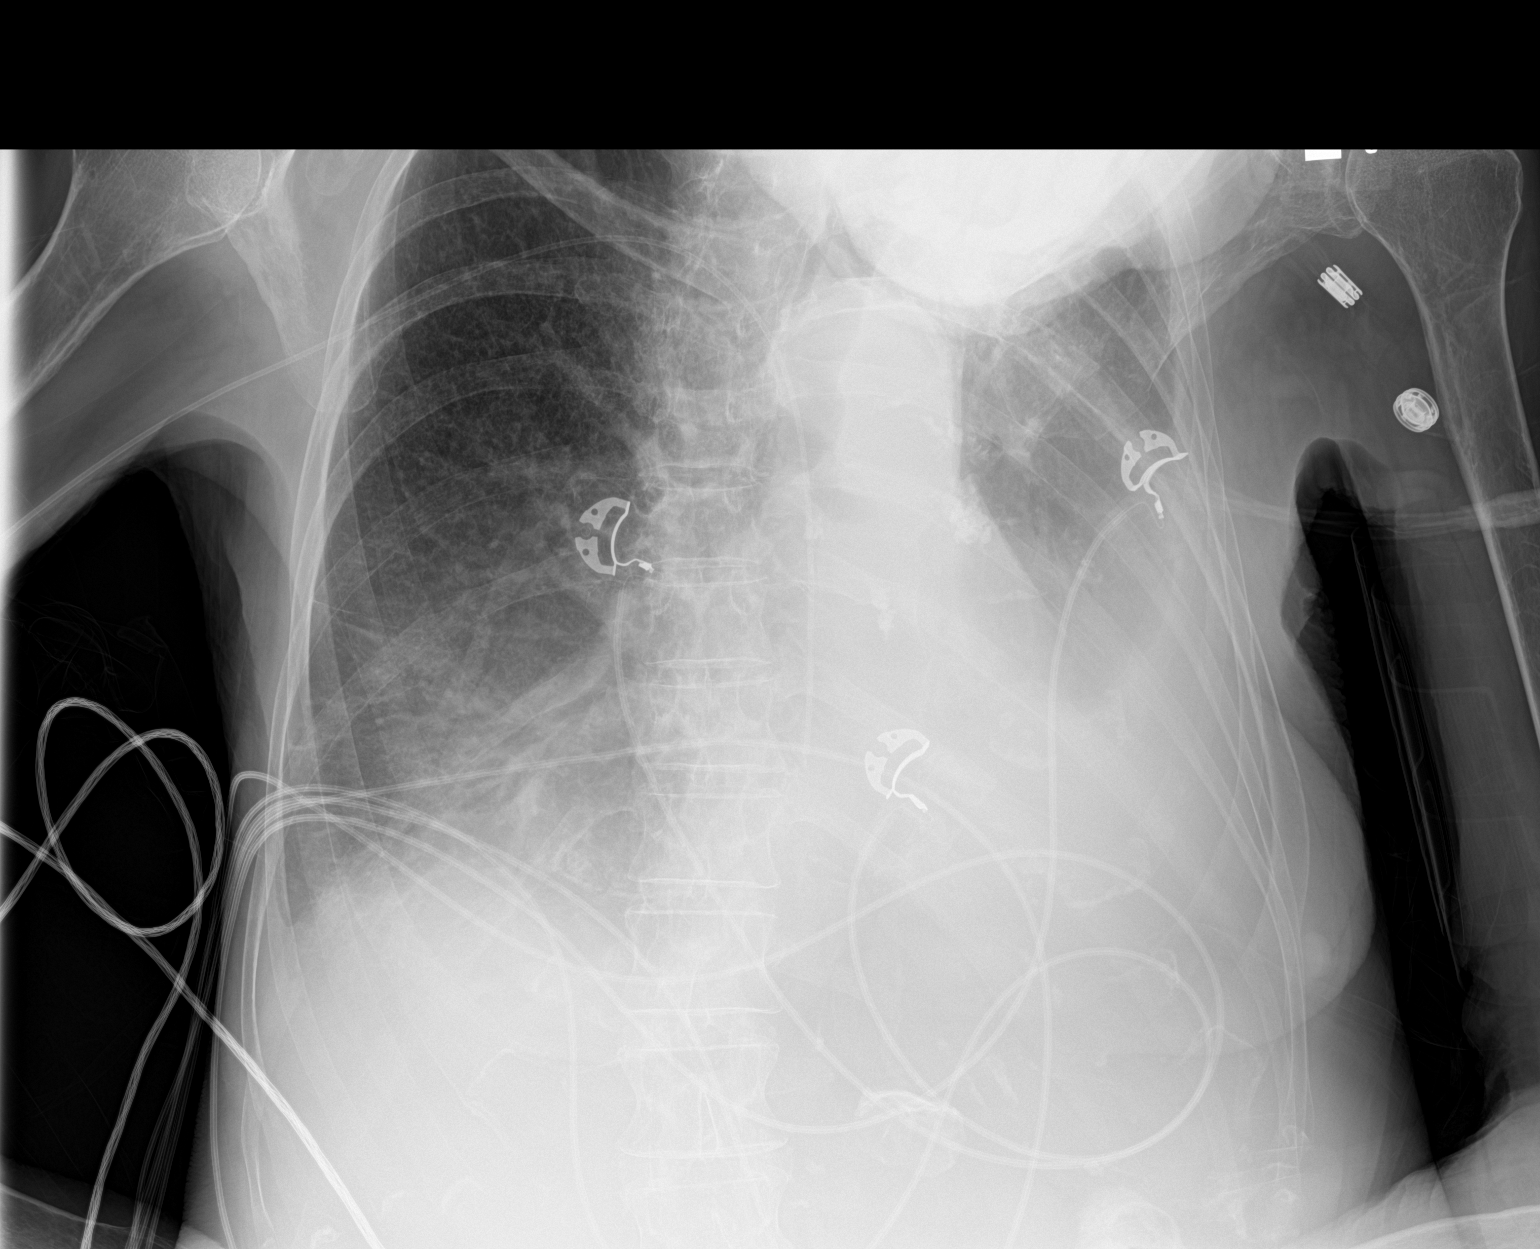

[1 of 1 positions shown; findings below may reference images not displayed]

FINDINGS: The patient's mandible obscures portions of the left upper lobe.
There is a persistent fairly sizable left pleural effusion with a
small right pleural effusion. There is atelectasis with suspected
consolidation in portions of the left lower lobe and lingula.
Atelectatic changes noted in the right lower lung region. Central
catheter tip is in the superior vena cava near the cavoatrial
junction. Heart is upper normal in size with pulmonary vascularity
normal. There is aortic atherosclerosis. No bone lesions.
IMPRESSION: Note that portions of the left upper lobe obscured by the patient's
mandible. Pleural effusions are noted bilaterally, larger on the
left than on the right, stable. Suspect atelectasis and potential
superimposed airspace consolidation left lower lobe and lingular
regions, stable. Atelectatic change right base, stable. Stable
cardiac silhouette. Central catheter tip in superior vena cava near
cavoatrial junction. No pneumothorax evident.

Aortic Atherosclerosis ([id number]).
